# Patient Record
Sex: Female | Born: 1990 | Race: Black or African American | Hispanic: No | Marital: Single | Smoking: Former smoker
Health system: Southern US, Community
[De-identification: ages and names within clinical notes are randomized; demographics above are authoritative.]

## PROBLEM LIST (undated history)

## (undated) DIAGNOSIS — F419 Anxiety disorder, unspecified: Secondary | ICD-10-CM

## (undated) DIAGNOSIS — J45909 Unspecified asthma, uncomplicated: Secondary | ICD-10-CM

## (undated) DIAGNOSIS — B20 Human immunodeficiency virus [HIV] disease: Secondary | ICD-10-CM

## (undated) DIAGNOSIS — F32A Depression, unspecified: Secondary | ICD-10-CM

## (undated) DIAGNOSIS — Z21 Asymptomatic human immunodeficiency virus [HIV] infection status: Secondary | ICD-10-CM

## (undated) HISTORY — DX: Depression, unspecified: F32.A

## (undated) HISTORY — PX: NO PAST SURGERIES: SHX2092

## (undated) HISTORY — DX: Anxiety disorder, unspecified: F41.9

---

## 2016-04-13 DIAGNOSIS — R110 Nausea: Secondary | ICD-10-CM | POA: Insufficient documentation

## 2016-04-13 DIAGNOSIS — G44209 Tension-type headache, unspecified, not intractable: Secondary | ICD-10-CM | POA: Insufficient documentation

## 2016-04-13 DIAGNOSIS — R109 Unspecified abdominal pain: Secondary | ICD-10-CM | POA: Insufficient documentation

## 2018-05-12 DIAGNOSIS — Z21 Asymptomatic human immunodeficiency virus [HIV] infection status: Secondary | ICD-10-CM | POA: Insufficient documentation

## 2018-05-12 DIAGNOSIS — F419 Anxiety disorder, unspecified: Secondary | ICD-10-CM | POA: Insufficient documentation

## 2019-11-17 DIAGNOSIS — F32A Depression, unspecified: Secondary | ICD-10-CM | POA: Insufficient documentation

## 2020-02-07 ENCOUNTER — Emergency Department (HOSPITAL_COMMUNITY): Payer: Medicaid Other

## 2020-02-07 ENCOUNTER — Emergency Department (HOSPITAL_COMMUNITY)
Admission: EM | Admit: 2020-02-07 | Discharge: 2020-02-07 | Disposition: A | Discharge status: Home / Self Care | Payer: Medicaid Other | Attending: Emergency Medicine | Admitting: Emergency Medicine

## 2020-02-07 ENCOUNTER — Encounter (HOSPITAL_COMMUNITY): Payer: Self-pay | Admitting: *Deleted

## 2020-02-07 DIAGNOSIS — R112 Nausea with vomiting, unspecified: Secondary | ICD-10-CM | POA: Diagnosis present

## 2020-02-07 DIAGNOSIS — R1084 Generalized abdominal pain: Secondary | ICD-10-CM | POA: Diagnosis not present

## 2020-02-07 DIAGNOSIS — R197 Diarrhea, unspecified: Secondary | ICD-10-CM | POA: Insufficient documentation

## 2020-02-07 DIAGNOSIS — Z20822 Contact with and (suspected) exposure to covid-19: Secondary | ICD-10-CM | POA: Insufficient documentation

## 2020-02-07 DIAGNOSIS — R531 Weakness: Secondary | ICD-10-CM | POA: Diagnosis not present

## 2020-02-07 HISTORY — DX: Asymptomatic human immunodeficiency virus (hiv) infection status: Z21

## 2020-02-07 HISTORY — DX: Human immunodeficiency virus (HIV) disease: B20

## 2020-02-07 LAB — LIPASE, BLOOD: Lipase: 23 U/L (ref 11–51)

## 2020-02-07 LAB — CBC
HCT: 36.2 % (ref 36.0–46.0)
Hemoglobin: 12.4 g/dL (ref 12.0–15.0)
MCH: 32.2 pg (ref 26.0–34.0)
MCHC: 34.3 g/dL (ref 30.0–36.0)
MCV: 94 fL (ref 80.0–100.0)
Platelets: 254 10*3/uL (ref 150–400)
RBC: 3.85 MIL/uL — ABNORMAL LOW (ref 3.87–5.11)
RDW: 13.6 % (ref 11.5–15.5)
WBC: 10.8 10*3/uL — ABNORMAL HIGH (ref 4.0–10.5)
nRBC: 0 % (ref 0.0–0.2)

## 2020-02-07 LAB — URINALYSIS, ROUTINE W REFLEX MICROSCOPIC
Bilirubin Urine: NEGATIVE
Glucose, UA: NEGATIVE mg/dL
Hgb urine dipstick: NEGATIVE
Ketones, ur: NEGATIVE mg/dL
Leukocytes,Ua: NEGATIVE
Nitrite: NEGATIVE
Protein, ur: NEGATIVE mg/dL
Specific Gravity, Urine: 1.01 (ref 1.005–1.030)
pH: 9 — ABNORMAL HIGH (ref 5.0–8.0)

## 2020-02-07 LAB — SARS CORONAVIRUS 2 BY RT PCR (HOSPITAL ORDER, PERFORMED IN ~~LOC~~ HOSPITAL LAB): SARS Coronavirus 2: NEGATIVE

## 2020-02-07 LAB — COMPREHENSIVE METABOLIC PANEL
ALT: 35 U/L (ref 0–44)
AST: 41 U/L (ref 15–41)
Albumin: 4.6 g/dL (ref 3.5–5.0)
Alkaline Phosphatase: 41 U/L (ref 38–126)
Anion gap: 15 (ref 5–15)
BUN: 10 mg/dL (ref 6–20)
CO2: 20 mmol/L — ABNORMAL LOW (ref 22–32)
Calcium: 9.8 mg/dL (ref 8.9–10.3)
Chloride: 107 mmol/L (ref 98–111)
Creatinine, Ser: 0.72 mg/dL (ref 0.44–1.00)
GFR calc Af Amer: >60 mL/min (ref >60)
GFR calc non Af Amer: >60 mL/min (ref >60)
Glucose, Bld: 127 mg/dL — ABNORMAL HIGH (ref 70–99)
Potassium: 4 mmol/L (ref 3.5–5.1)
Sodium: 142 mmol/L (ref 135–145)
Total Bilirubin: 0.5 mg/dL (ref 0.3–1.2)
Total Protein: 7.4 g/dL (ref 6.5–8.1)

## 2020-02-07 LAB — I-STAT BETA HCG BLOOD, ED (MC, WL, AP ONLY): I-stat hCG, quantitative: <5 m[IU]/mL (ref <5)

## 2020-02-07 LAB — LACTIC ACID, PLASMA: Lactic Acid, Venous: 1.9 mmol/L (ref 0.5–1.9)

## 2020-02-07 LAB — LACTATE DEHYDROGENASE: LDH: 153 U/L (ref 98–192)

## 2020-02-07 MED ORDER — ONDANSETRON 8 MG PO TBDP: 8.0000 mg | ORAL_TABLET | Freq: Three times a day (TID) | ORAL | 0 refills | Status: DC | PRN

## 2020-02-07 MED ORDER — PROCHLORPERAZINE EDISYLATE 10 MG/2ML IJ SOLN
10.0000 mg | Freq: Once | INTRAMUSCULAR | Status: AC
  Administered 2020-02-07: 10 mg via INTRAVENOUS
  Filled 2020-02-07: qty 2

## 2020-02-07 MED ORDER — DICYCLOMINE HCL 10 MG/ML IM SOLN
20.0000 mg | Freq: Once | INTRAMUSCULAR | Status: AC
  Administered 2020-02-07: 20 mg via INTRAMUSCULAR
  Filled 2020-02-07: qty 2

## 2020-02-07 MED ORDER — SODIUM CHLORIDE 0.9 % IV BOLUS
1000.0000 mL | Freq: Once | INTRAVENOUS | Status: AC
  Administered 2020-02-07: 1000 mL via INTRAVENOUS

## 2020-02-07 MED ORDER — IOHEXOL 300 MG/ML  SOLN
100.0000 mL | Freq: Once | INTRAMUSCULAR | Status: AC | PRN
  Administered 2020-02-07: 100 mL via INTRAVENOUS

## 2020-02-07 MED ORDER — LORAZEPAM 2 MG/ML IJ SOLN
1.0000 mg | Freq: Once | INTRAMUSCULAR | Status: AC
  Administered 2020-02-07: 1 mg via INTRAVENOUS
  Filled 2020-02-07: qty 1

## 2020-02-07 NOTE — ED Provider Notes (Signed)
Patient signed to me by Dr. Jeraldine Loots pending results of labs and IV fluids.  Patient reassessed and feels better will be discharged home   Lorre Nick, MD 02/07/20 1652

## 2020-02-07 NOTE — ED Triage Notes (Signed)
BIB EMS ate at fish yesterday afternoon, about 5 am she developed vomiting and diarrhea with abd cramping, uncontrolled diarrhea in small amounts. #18 LA, 500 NS 4 mg Zofran. 109/72-60s-20-100% RA CBG 162

## 2020-02-07 NOTE — ED Provider Notes (Signed)
[facility]-EMERGENCY DEPT Provider Note   CSN: [id number] Arrival date & time: 02/07/20  1055     History Chief Complaint  Patient presents with  . Emesis  . Diarrhea    Kristine Brown is a 28 y.o. female.  HPI     Patient with HIV, but previously in her usual state of health presents with new abdominal pain, vomiting, diarrhea, weakness. She notes that she was well until this morning.  Yesterday, about 20 hours prior to ED arrival today, the patient ate fish at a fast food restaurant. This morning she woke about 5 hours prior to ED arrival with diffuse abdominal pain, nausea, vomiting, and began to have innumerable episodes of loose stool.  She has noted inability to control her bowels secondary to pain, diarrhea, with no ability to take anything for relief, nor to take her typical meds. She does not know left fever, notes chills sensation, however.  The fish may have been flounder  Past Medical History:  Diagnosis Date  . HIV (human immunodeficiency virus infection) (HCC)     There are no problems to display for this patient.   History reviewed. No pertinent surgical history.   OB History   No obstetric history on file.     No family history on file.  Social History   Tobacco Use  . Smoking status: Not on file  Substance Use Topics  . Alcohol use: Not on file  . Drug use: Not on file    Home Medications Prior to Admission medications   Not on File    Allergies    Patient has no allergy information on record.  Review of Systems   Review of Systems  Constitutional:       Per HPI, otherwise negative  HENT:       Per HPI, otherwise negative  Respiratory:       Per HPI, otherwise negative  Cardiovascular:       Per HPI, otherwise negative  Gastrointestinal: Positive for abdominal pain, diarrhea, nausea and vomiting.  Endocrine:       Negative aside from HPI  Genitourinary:       Neg aside from HPI   Musculoskeletal:        Per HPI, otherwise negative  Skin: Negative.   Allergic/Immunologic: Positive for immunocompromised state.  Neurological: Positive for weakness. Negative for syncope.    Physical Exam Updated Vital Signs BP 124/79   Pulse (!) 53   Temp 98.3 F (36.8 C) (Oral)   Resp 18   SpO2 100%   Physical Exam Vitals and nursing note reviewed.  Constitutional:      Appearance: She is well-developed. She is ill-appearing and diaphoretic.  HENT:     Head: Normocephalic and atraumatic.  Eyes:     Conjunctiva/sclera: Conjunctivae normal.  Cardiovascular:     Rate and Rhythm: Normal rate and regular rhythm.  Pulmonary:     Effort: Pulmonary effort is normal. No respiratory distress.     Breath sounds: Normal breath sounds. No stridor.  Abdominal:     Comments: She complains of pain throughout.  No distension  Skin:    General: Skin is warm.  Neurological:     Mental Status: She is alert and oriented to person, place, and time.     Cranial Nerves: No cranial nerve deficit.     ED Results / Procedures / Treatments   Labs (all labs ordered are listed, but only abnormal results are displayed) Labs Reviewed  COMPREHENSIVE METABOLIC PANEL - Abnormal; Notable for the following components:      Result Value   CO2 20 (*)    Glucose, Bld 127 (*)    All other components within normal limits  CBC - Abnormal; Notable for the following components:   WBC 10.8 (*)    RBC 3.85 (*)    All other components within normal limits  SARS CORONAVIRUS 2 BY RT PCR (HOSPITAL ORDER, PERFORMED IN [facility])  LIPASE, BLOOD  LACTATE DEHYDROGENASE  LACTIC ACID, PLASMA  URINALYSIS, ROUTINE W REFLEX MICROSCOPIC  I-STAT BETA HCG BLOOD, ED ([facility], [facility], [facility] ONLY)   Radiology CT Abdomen Pelvis W Contrast  Result Date: 02/07/2020 CLINICAL DATA:  Abdominal pain, fever and distension, developed nausea and vomiting at 0500 hours with associated cramping and diarrhea beginning at 0500 hours, had fish  yesterday; history HIV EXAM: CT ABDOMEN AND PELVIS WITH CONTRAST TECHNIQUE: Multidetector CT imaging of the abdomen and pelvis was performed using the standard protocol following bolus administration of intravenous contrast. Sagittal and coronal MPR images reconstructed from axial data set. CONTRAST:  OMNIPAQUE IOHEXOL 300 MG/ML SOLN IV. No oral contrast. COMPARISON:  None FINDINGS: Lower chest: Lung bases clear Hepatobiliary: Gallbladder contracted.  Liver normal appearance. Pancreas: Normal appearance Spleen: Normal appearance Adrenals/Urinary Tract: Adrenal glands, kidneys, ureters, and bladder normal appearance Stomach/Bowel: Appendix not visualized but no pericecal inflammatory process seen. Stomach and bowel loops otherwise normal appearance. Vascular/Lymphatic: Vascular structures patent.  No adenopathy. Reproductive: Unremarkable uterus and LEFT ovary. Ring enhancement of a ruptured physiologic follicle in the RIGHT ovary. Other: Tiny amount of nonspecific free fluid, potentially physiologic. No free air. No hernia. Musculoskeletal: Osseous structures unremarkable. IMPRESSION: No intra-abdominal or intrapelvic abnormalities. Electronically Signed   By: Ulyses Southward M.D.   On: 02/07/2020 13:15    Procedures Procedures (including critical care time)  Medications Ordered in ED Medications  dicyclomine (BENTYL) injection 20 mg (20 mg Intramuscular Given 02/07/20 1225)  LORazepam (ATIVAN) injection 1 mg (1 mg Intravenous Given 02/07/20 1223)  iohexol (OMNIPAQUE) 300 MG/ML solution 100 mL (100 mLs Intravenous Contrast Given 02/07/20 1256)    ED Course  I have reviewed the triage vital signs and the nursing notes.  Pertinent labs & imaging results that were available during my care of the patient were reviewed by me and considered in my medical decision making (see chart for details).    MDM Rules/Calculators/A&P Adult female with HIV, reportedly well controlled presents with 1 day of nausea,  vomiting, diarrhea, but is awake, alert, afebrile.  Patient received fluid resuscitation, antiemetics, analgesics, had some improvement, but on signout was receiving additional fluid resuscitation, awaiting additional lab studies, and p.o. challenge. Otherwise, abdominal exam, CT scan reassuring, no evidence for colitis, diverticulitis.  Dr. Freida Busman is aware of the patient. Final Clinical Impression(s) / ED Diagnoses Final diagnoses:  Nausea vomiting and diarrhea  Generalized abdominal pain     Gerhard Munch, MD 02/07/20 [phone]

## 2020-02-07 NOTE — ED Notes (Signed)
Dr Allen at bedside  

## 2020-02-08 ENCOUNTER — Other Ambulatory Visit: Payer: Self-pay

## 2020-02-08 ENCOUNTER — Encounter (HOSPITAL_COMMUNITY): Payer: Self-pay

## 2020-02-08 ENCOUNTER — Emergency Department (HOSPITAL_COMMUNITY)
Admission: EM | Admit: 2020-02-08 | Discharge: 2020-02-08 | Disposition: A | Discharge status: Home / Self Care | Payer: Medicaid Other | Attending: Emergency Medicine | Admitting: Emergency Medicine

## 2020-02-08 DIAGNOSIS — R197 Diarrhea, unspecified: Secondary | ICD-10-CM | POA: Insufficient documentation

## 2020-02-08 DIAGNOSIS — Z5321 Procedure and treatment not carried out due to patient leaving prior to being seen by health care provider: Secondary | ICD-10-CM | POA: Insufficient documentation

## 2020-02-08 DIAGNOSIS — R1110 Vomiting, unspecified: Secondary | ICD-10-CM | POA: Insufficient documentation

## 2020-02-08 NOTE — ED Notes (Signed)
Pt stated she was leaving. Waited outside for her mom to pick her up and when the mother got here, the mother told us that she needed to stay.  20 minutes later they left again.

## 2020-02-08 NOTE — ED Notes (Signed)
Patient requested for me to take IV out because she was leaving because she felt better. Patient ask for yellow socks and to put her away from people until her mother came to pick her up.

## 2020-02-08 NOTE — ED Triage Notes (Addendum)
Per EMS- patient c/o vomiting and incontinent of diarrhea. Patient was seen yesterday for the same. Patient was given 150 ml NS prior to arrival to the ED.  Patient yelling in triage. Patient stated, "I am not going out into that lobby with Covid patients. I want to go to another hospital. Can I call 911 and get taken to another hospital."  Charge nurse was present. EMS reported that the patient took Zofran prior to them leaving.

## 2020-02-09 ENCOUNTER — Emergency Department (HOSPITAL_COMMUNITY)
Admission: EM | Admit: 2020-02-09 | Discharge: 2020-02-09 | Disposition: A | Discharge status: Home / Self Care | Payer: Medicaid Other | Attending: Emergency Medicine | Admitting: Emergency Medicine

## 2020-02-09 ENCOUNTER — Encounter (HOSPITAL_COMMUNITY): Payer: Self-pay | Admitting: *Deleted

## 2020-02-09 ENCOUNTER — Telehealth: Payer: Self-pay

## 2020-02-09 DIAGNOSIS — R1110 Vomiting, unspecified: Secondary | ICD-10-CM | POA: Diagnosis not present

## 2020-02-09 DIAGNOSIS — R109 Unspecified abdominal pain: Secondary | ICD-10-CM | POA: Insufficient documentation

## 2020-02-09 DIAGNOSIS — Z5321 Procedure and treatment not carried out due to patient leaving prior to being seen by health care provider: Secondary | ICD-10-CM | POA: Insufficient documentation

## 2020-02-09 LAB — CBC
HCT: 39 % (ref 36.0–46.0)
Hemoglobin: 12.7 g/dL (ref 12.0–15.0)
MCH: 30.8 pg (ref 26.0–34.0)
MCHC: 32.6 g/dL (ref 30.0–36.0)
MCV: 94.4 fL (ref 80.0–100.0)
Platelets: 290 10*3/uL (ref 150–400)
RBC: 4.13 MIL/uL (ref 3.87–5.11)
RDW: 13.3 % (ref 11.5–15.5)
WBC: 5.9 10*3/uL (ref 4.0–10.5)
nRBC: 0 % (ref 0.0–0.2)

## 2020-02-09 LAB — COMPREHENSIVE METABOLIC PANEL
ALT: 34 U/L (ref 0–44)
AST: 28 U/L (ref 15–41)
Albumin: 4.2 g/dL (ref 3.5–5.0)
Alkaline Phosphatase: 43 U/L (ref 38–126)
Anion gap: 12 (ref 5–15)
BUN: 8 mg/dL (ref 6–20)
CO2: 21 mmol/L — ABNORMAL LOW (ref 22–32)
Calcium: 9.4 mg/dL (ref 8.9–10.3)
Chloride: 105 mmol/L (ref 98–111)
Creatinine, Ser: 0.96 mg/dL (ref 0.44–1.00)
GFR calc Af Amer: >60 mL/min (ref >60)
GFR calc non Af Amer: >60 mL/min (ref >60)
Glucose, Bld: 101 mg/dL — ABNORMAL HIGH (ref 70–99)
Potassium: 3.3 mmol/L — ABNORMAL LOW (ref 3.5–5.1)
Sodium: 138 mmol/L (ref 135–145)
Total Bilirubin: 0.8 mg/dL (ref 0.3–1.2)
Total Protein: 7.4 g/dL (ref 6.5–8.1)

## 2020-02-09 LAB — URINALYSIS, ROUTINE W REFLEX MICROSCOPIC
Bilirubin Urine: NEGATIVE
Glucose, UA: NEGATIVE mg/dL
Hgb urine dipstick: NEGATIVE
Ketones, ur: 80 mg/dL — AB
Nitrite: NEGATIVE
Protein, ur: 30 mg/dL — AB
Specific Gravity, Urine: 1.03 (ref 1.005–1.030)
pH: 6 (ref 5.0–8.0)

## 2020-02-09 LAB — LIPASE, BLOOD: Lipase: 27 U/L (ref 11–51)

## 2020-02-09 LAB — I-STAT BETA HCG BLOOD, ED (MC, WL, AP ONLY): I-stat hCG, quantitative: <5 m[IU]/mL (ref <5)

## 2020-02-09 NOTE — ED Notes (Signed)
Called patient several times didn't answer

## 2020-02-09 NOTE — ED Triage Notes (Signed)
To ED for eval of abd pain. States she was at [facility] for same 2 days ago and discharged with zofran. States she is taking the zofran without relief. Pt states she is unable to normally prescribed meds due to vomiting.

## 2020-02-09 NOTE — Telephone Encounter (Signed)
Patient called in to get her tested result from 02/07/2020 which ws negative. Patient reported that she was having chest pain that was getting worse. I asked patient if I could send her for triage and she refused to talk with anyone. I ask patient if she wanted me to call 911 and she refused at first and then mom asked me to call. I think patient may have hung up while I was calling. Tried to contact patient back but no answer.

## 2021-02-04 ENCOUNTER — Other Ambulatory Visit (HOSPITAL_COMMUNITY): Payer: Self-pay

## 2021-02-04 ENCOUNTER — Other Ambulatory Visit: Payer: Self-pay

## 2021-02-04 ENCOUNTER — Ambulatory Visit: Payer: Self-pay

## 2021-02-04 ENCOUNTER — Other Ambulatory Visit: Payer: Medicaid Other

## 2021-02-04 DIAGNOSIS — Z113 Encounter for screening for infections with a predominantly sexual mode of transmission: Secondary | ICD-10-CM

## 2021-02-04 DIAGNOSIS — B20 Human immunodeficiency virus [HIV] disease: Secondary | ICD-10-CM

## 2021-02-04 DIAGNOSIS — Z79899 Other long term (current) drug therapy: Secondary | ICD-10-CM

## 2021-02-05 LAB — URINALYSIS
Bilirubin Urine: NEGATIVE
Glucose, UA: NEGATIVE
Hgb urine dipstick: NEGATIVE
Leukocytes,Ua: NEGATIVE
Nitrite: NEGATIVE
Protein, ur: NEGATIVE
Specific Gravity, Urine: 1.029 (ref 1.001–1.035)
pH: 5.5 (ref 5.0–8.0)

## 2021-02-05 LAB — URINE CYTOLOGY ANCILLARY ONLY
Chlamydia: NEGATIVE
Comment: NEGATIVE
Comment: NORMAL
Neisseria Gonorrhea: NEGATIVE

## 2021-02-05 LAB — T-HELPER CELL (CD4) - (RCID CLINIC ONLY)
CD4 % Helper T Cell: 43 % (ref 33–65)
CD4 T Cell Abs: 1010 /uL (ref 400–1790)

## 2021-02-08 LAB — HIV-1/2 AB - DIFFERENTIATION
HIV-1 antibody: POSITIVE — AB
HIV-2 Ab: NEGATIVE

## 2021-02-08 LAB — HIV ANTIBODY (ROUTINE TESTING W REFLEX): HIV 1&2 Ab, 4th Generation: REACTIVE — AB

## 2021-02-08 LAB — COMPLETE METABOLIC PANEL WITH GFR
AG Ratio: 1.7 (calc) (ref 1.0–2.5)
ALT: 15 U/L (ref 6–29)
AST: 18 U/L (ref 10–30)
Albumin: 4.6 g/dL (ref 3.6–5.1)
Alkaline phosphatase (APISO): 42 U/L (ref 31–125)
BUN: 9 mg/dL (ref 7–25)
CO2: 27 mmol/L (ref 20–32)
Calcium: 9.7 mg/dL (ref 8.6–10.2)
Chloride: 104 mmol/L (ref 98–110)
Creat: 0.9 mg/dL (ref 0.50–0.97)
Globulin: 2.7 g/dL (calc) (ref 1.9–3.7)
Glucose, Bld: 96 mg/dL (ref 65–99)
Potassium: 3.9 mmol/L (ref 3.5–5.3)
Sodium: 138 mmol/L (ref 135–146)
Total Bilirubin: 0.5 mg/dL (ref 0.2–1.2)
Total Protein: 7.3 g/dL (ref 6.1–8.1)
eGFR: 87 mL/min/{1.73_m2} (ref >=60)

## 2021-02-08 LAB — CBC WITH DIFFERENTIAL/PLATELET
Absolute Monocytes: 581 {cells}/uL (ref 200–950)
Basophils Absolute: 29 {cells}/uL (ref 0–200)
Basophils Relative: 0.6 %
Eosinophils Absolute: 48 {cells}/uL (ref 15–500)
Eosinophils Relative: 1 %
HCT: 37.3 % (ref 35.0–45.0)
Hemoglobin: 12 g/dL (ref 11.7–15.5)
Lymphs Abs: 2371 {cells}/uL (ref 850–3900)
MCH: 30.8 pg (ref 27.0–33.0)
MCHC: 32.2 g/dL (ref 32.0–36.0)
MCV: 95.9 fL (ref 80.0–100.0)
MPV: 10.2 fL (ref 7.5–12.5)
Monocytes Relative: 12.1 %
Neutro Abs: 1771 {cells}/uL (ref 1500–7800)
Neutrophils Relative %: 36.9 %
Platelets: 164 10*3/uL (ref 140–400)
RBC: 3.89 Million/uL (ref 3.80–5.10)
RDW: 12.6 % (ref 11.0–15.0)
Total Lymphocyte: 49.4 %
WBC: 4.8 10*3/uL (ref 3.8–10.8)

## 2021-02-08 LAB — HEPATITIS B CORE ANTIBODY, TOTAL: Hep B Core Total Ab: NONREACTIVE

## 2021-02-08 LAB — HEPATITIS C ANTIBODY
Hepatitis C Ab: NONREACTIVE
SIGNAL TO CUT-OFF: 0.02

## 2021-02-08 LAB — LIPID PANEL
Cholesterol: 168 mg/dL
HDL: 59 mg/dL
LDL Cholesterol (Calc): 95 mg/dL
Non-HDL Cholesterol (Calc): 109 mg/dL
Total CHOL/HDL Ratio: 2.8 (calc)
Triglycerides: 53 mg/dL

## 2021-02-08 LAB — QUANTIFERON-TB GOLD PLUS
Mitogen-NIL: >10 IU/mL
NIL: 0.07 IU/mL
QuantiFERON-TB Gold Plus: NEGATIVE
TB1-NIL: <0 IU/mL
TB2-NIL: 0 IU/mL

## 2021-02-08 LAB — HIV-1 RNA ULTRAQUANT REFLEX TO GENTYP+
HIV 1 RNA Quant: NOT DETECTED copies/mL
HIV-1 RNA Quant, Log: NOT DETECTED Log copies/mL

## 2021-02-08 LAB — RPR: RPR Ser Ql: NONREACTIVE

## 2021-02-08 LAB — HEPATITIS B SURFACE ANTIBODY,QUALITATIVE: Hep B S Ab: REACTIVE — AB

## 2021-02-08 LAB — HEPATITIS A ANTIBODY, TOTAL: Hepatitis A AB,Total: NONREACTIVE

## 2021-02-08 LAB — HLA B*5701: HLA-B*5701 w/rflx HLA-B High: NEGATIVE

## 2021-02-08 LAB — HEPATITIS B SURFACE ANTIGEN: Hepatitis B Surface Ag: NONREACTIVE

## 2021-02-11 ENCOUNTER — Other Ambulatory Visit: Payer: Self-pay | Admitting: Pharmacist

## 2021-02-11 DIAGNOSIS — B20 Human immunodeficiency virus [HIV] disease: Secondary | ICD-10-CM

## 2021-02-11 MED ORDER — BICTEGRAVIR-EMTRICITAB-TENOFOV 50-200-25 MG PO TABS: 1.0000 | ORAL_TABLET | Freq: Every day | ORAL | 0 refills | Status: AC

## 2021-02-11 NOTE — Progress Notes (Signed)
Medication Samples have been provided to the patient.  Drug name: Biktarvy        Strength: 50/200/25 mg       Qty: 28 tablets (4 bottles) LOT: CHSYVB   Exp.Date: 6/24  Dosing instructions: Take one tablet by mouth once daily  The patient has been instructed regarding the correct time, dose, and frequency of taking this medication, including desired effects and most common side effects.   Winthrop Shannahan, PharmD, CPP Clinical Pharmacist Practitioner Infectious Diseases Clinical Pharmacist [facility]  05/13/2020, 10:07 AM  

## 2021-02-14 ENCOUNTER — Encounter: Payer: Self-pay | Admitting: Infectious Diseases

## 2021-02-18 ENCOUNTER — Ambulatory Visit: Payer: Medicaid Other | Admitting: Pharmacist

## 2021-02-18 ENCOUNTER — Other Ambulatory Visit (HOSPITAL_COMMUNITY): Payer: Self-pay

## 2021-02-18 ENCOUNTER — Encounter: Payer: Medicaid Other | Admitting: Infectious Diseases

## 2021-02-18 ENCOUNTER — Telehealth: Payer: Self-pay

## 2021-02-18 NOTE — Telephone Encounter (Signed)
[facility] Patient Product/process development scientist completed.    The patient is insured through [employer]  and has a 4.00 copay.   Kristine Brown is covered under pharmacy benefits, prescription can be filled at [facility].   We will continue to follow to see if copay assistance is needed.  Clearance Coots, CPhT Specialty Pharmacy Patient [facility] Phone: [phone] Fax:  [phone]

## 2021-02-18 NOTE — Progress Notes (Deleted)
   Name: Kristine Brown  DOB: May 03, 1991 MRN: [id number] PCP: Patient, No Pcp Per (Inactive)     Brief Narrative:  *** is a 30 y.o. *** with HIV disease, Dx ***.  Previously in care with [facility].  CD4 nadir *** VL *** HIV Risk: *** History of OIs: *** Intake Labs ***: Hep B sAg (***), sAb (***), cAb (***); Hep A (***), Hep C (***) Quantiferon (***) HLA B*5701 (***) G6PD: (normal 06-2018) CMV IgG (+) Toxo IgG (- 06-2018)   Previous Regimens: ***  Genotypes: ***  Subjective:  No chief complaint on file.    HPI: ***  Chart review shows undetectable viral loads since 2018.  Quantiferon GOLD (-) 07-2020.  Last PAP smear: unknown    No flowsheet data found.  Health Maintenance = Receives annual preventative care through {his/her} PCP and is up to date on all recommended screenings and vaccinations. {He/She} denies cigarette use, excessive alcohol use and other substance abuse. {He/She} is seeing a dentist regularly and no dental complaints today.   ROS  Past Medical History:  Diagnosis Date   HIV (human immunodeficiency virus infection) (Advance)     Outpatient Medications Prior to Visit  Medication Sig Dispense Refill   bictegravir-emtricitabine-tenofovir AF (BIKTARVY) 50-200-25 MG TABS tablet Take 1 tablet by mouth daily for 28 days. 28 tablet 0   ondansetron (ZOFRAN ODT) 8 MG disintegrating tablet Take 1 tablet (8 mg total) by mouth every 8 (eight) hours as needed for nausea or vomiting. 20 tablet 0   No facility-administered medications prior to visit.     No Known Allergies  Social History   Tobacco Use   Smoking status: Never   Smokeless tobacco: Never  Vaping Use   Vaping Use: Never used  Substance Use Topics   Alcohol use: Never   Drug use: Never    No family history on file.  Social History   Substance and Sexual Activity  Sexual Activity Not on file     Objective:  There were no vitals filed for this visit. There is no height or  weight on file to calculate BMI.  Physical Exam  Lab Results Lab Results  Component Value Date   WBC 4.8 02/04/2021   HGB 12.0 02/04/2021   HCT 37.3 02/04/2021   MCV 95.9 02/04/2021   PLT 164 02/04/2021    Lab Results  Component Value Date   CREATININE 0.90 02/04/2021   BUN 9 02/04/2021   NA 138 02/04/2021   K 3.9 02/04/2021   CL 104 02/04/2021   CO2 27 02/04/2021    Lab Results  Component Value Date   ALT 15 02/04/2021   AST 18 02/04/2021   ALKPHOS 43 02/09/2020   BILITOT 0.5 02/04/2021    Lab Results  Component Value Date   CHOL 168 02/04/2021   HDL 59 02/04/2021   LDLCALC 95 02/04/2021   TRIG 53 02/04/2021   CHOLHDL 2.8 02/04/2021   HIV 1 RNA Quant (copies/mL)  Date Value  02/04/2021 NOT DETECTED   CD4 T Cell Abs (/uL)  Date Value  02/04/2021 1,010     Assessment & Plan:   Problem List Items Addressed This Visit   None   Janene Madeira, MSN, NP-C [place] [facility] [place] Pager: [phone] Office: [phone]  02/18/21  8:46 AM

## 2021-03-04 ENCOUNTER — Telehealth: Payer: Self-pay

## 2021-03-04 NOTE — Telephone Encounter (Signed)
Patient called requesting additional medication. She was provided with 28 days worth of Biktarvy on 9/13. She says she must have thrown one bottle away as she ran out of medication today.   Patient has not been seen by a provider as she has no showed and canceled previous appointments. She reportedly told one of the CMA's that she needed more medication as she was transferring care to [place] and had not found a provider. She then said that she would like to make an appointment at our clinic. She is schedueld as a new patient appointment with Marcos Eke, NP 10/7  Advised her that RN cannot refill her medication as she has not been seen here by a provider, but that concerns would be routed to pharmacy.   Sandie Ano, RN

## 2021-03-04 NOTE — Telephone Encounter (Signed)
RN spoke with pharmacy team, they agree that patient will need appointment for further medication.   Patient was adamant she did not want to be 3 days without medication. RN rescheduled her to see Marcos Eke, NP tomorrow 10/5. She states she did not know about her appointment that she missed on 9/20. Patient has completed labs and financial. Says she is transferring from [place].   Sandie Ano, RN

## 2021-03-05 ENCOUNTER — Other Ambulatory Visit (HOSPITAL_COMMUNITY): Payer: Self-pay

## 2021-03-05 ENCOUNTER — Other Ambulatory Visit: Payer: Self-pay

## 2021-03-05 ENCOUNTER — Ambulatory Visit (INDEPENDENT_AMBULATORY_CARE_PROVIDER_SITE_OTHER): Payer: Medicaid Other | Admitting: Family

## 2021-03-05 ENCOUNTER — Encounter: Payer: Self-pay | Admitting: Family

## 2021-03-05 ENCOUNTER — Ambulatory Visit: Payer: Medicaid Other | Admitting: Pharmacist

## 2021-03-05 VITALS — BP 105/70 | HR 60 | Temp 97.9°F | Wt 135.0 lb

## 2021-03-05 DIAGNOSIS — Z21 Asymptomatic human immunodeficiency virus [HIV] infection status: Secondary | ICD-10-CM

## 2021-03-05 DIAGNOSIS — F329 Major depressive disorder, single episode, unspecified: Secondary | ICD-10-CM

## 2021-03-05 MED ORDER — BIKTARVY 50-200-25 MG PO TABS
1.0000 | ORAL_TABLET | Freq: Every day | ORAL | 4 refills | Status: DC
Start: 2021-03-05 — End: 2021-07-22
  Filled 2021-03-05 – 2021-03-06 (×2): qty 30, 30d supply, fill #0
  Filled 2021-04-01: qty 30, 30d supply, fill #1
  Filled 2021-04-28: qty 30, 30d supply, fill #2
  Filled 2021-06-13: qty 30, 30d supply, fill #3
  Filled 2021-07-02: qty 30, 30d supply, fill #4

## 2021-03-05 MED ORDER — ESCITALOPRAM OXALATE 10 MG PO TABS
5.0000 mg | ORAL_TABLET | Freq: Every day | ORAL | 4 refills | Status: DC
  Filled 2021-03-05: qty 30, 60d supply, fill #0
  Filled 2021-04-28: qty 30, 60d supply, fill #1

## 2021-03-05 NOTE — Progress Notes (Signed)
Brief Narrative   Patient ID: Kristine Brown, female    DOB: Oct 31, 1990, 30 y.o.   MRN: [id number]  Kristine Brown is a 30 y/o AA female diagnosed with HIV in May of 2019 with risk factor of heterosexual contact. Initial CD4 count of 769 and viral load of 16,400. Genotype with no significant resistance patterns. Entered care at  [facility] Stage 1. [id number] negative. Sole medication regimen of Biktarvy.   Subjective:    Chief Complaint  Patient presents with   New Patient (Initial Visit)    Transferring B20 - pt reports feeling very fatigue and upper back pain.     HPI:  Kristine Brown is a 30 y.o. female with HIV disease and anxiety/depression presenting today to transfer care of HIV disease from previous provider in [place].   Kristine Brown previous blood work from 12/13/20 with viral load that was undetectable and CD4 count of 769. Diagnosed with HIV disease in May of 2019 with risk factor of heterosexual contact. Initial viral load of 16,400 with CD4 count of 352. Genotype with Subtype B and no significant resistance patterns. She was started on Biktarvy. No history of opportunistic infections. She moved to [place] about 2 years ago and has been going back and forth between [place] and [place]. Has been taking her Biktarvy daily as prescribed with no adverse side effects. Currently out of medication. Has Medicaid and is receiving SNAP for her and her children. Housing is stable and living in [place]. Does not have a lot of support in the area. Has been having problems with increased fatigue, decreased drive/motivation and generalized aches and pains.   Kristine Brown was previously diagnosed with depression and has been having increased depression symptoms related to her HIV diagnosis. She provides a form to complete about HIV and receiving disability as she wants to work and has been unable to secure employment. Has been out of her previously prescribed Lexapro for about  a month now. Has had thoughts of "ending it" but not currently.  No current recreational or illicit drug use, tobacco use or alcohol consumption.   Kristine Brown initial clinic lab work with confirmation of HIV-1 disease. CD4 count is 1010 with viral load undetectable. [id number] and Quantiferon Gold negative. Immune to Hepatitis B. No infection with Hepatitis C or STIs. Kidney function, liver function and electrolytes within normal ranges.   No Known Allergies    Outpatient Medications Prior to Visit  Medication Sig Dispense Refill   albuterol (VENTOLIN HFA) 108 (90 Base) MCG/ACT inhaler Inhale into the lungs.     Multiple Vitamins-Minerals (ONCOVITE) TABS Take 1 tablet by mouth daily.     ondansetron (ZOFRAN ODT) 8 MG disintegrating tablet Take 1 tablet (8 mg total) by mouth every 8 (eight) hours as needed for nausea or vomiting. 20 tablet 0   bictegravir-emtricitabine-tenofovir AF (BIKTARVY) 50-200-25 MG TABS tablet Take 1 tablet by mouth daily.     escitalopram (LEXAPRO) 5 MG tablet Take 1 tablet by mouth daily.     ondansetron (ZOFRAN-ODT) 4 MG disintegrating tablet Take by mouth. (Patient not taking: Reported on 03/05/2021)     No facility-administered medications prior to visit.     Past Medical History:  Diagnosis Date   Anxiety    Depression    HIV (human immunodeficiency virus infection) (HCC)      No past surgical history on file.    Review of Systems  Constitutional:  Negative for appetite change, chills, diaphoresis, fatigue, fever and unexpected  weight change.  Eyes:        Negative for acute change in vision  Respiratory:  Negative for chest tightness, shortness of breath and wheezing.   Cardiovascular:  Negative for chest pain.  Gastrointestinal:  Negative for diarrhea, nausea and vomiting.  Genitourinary:  Negative for dysuria, pelvic pain and vaginal discharge.  Musculoskeletal:  Negative for neck pain and neck stiffness.  Skin:  Negative for rash.   Neurological:  Negative for seizures, syncope, weakness and headaches.  Hematological:  Negative for adenopathy. Does not bruise/bleed easily.  Psychiatric/Behavioral:  Positive for decreased concentration and dysphoric mood. Negative for hallucinations.      Objective:    BP 105/70   Pulse 60   Temp 97.9 F (36.6 C) (Oral)   Wt 135 lb (61.2 kg)   SpO2 100%   BMI 24.69 kg/m  Nursing note and vital signs reviewed.  Physical Exam Constitutional:      General: She is not in acute distress.    Appearance: She is well-developed.     Comments: Seated in the chair; labile mood.   Eyes:     Conjunctiva/sclera: Conjunctivae normal.  Cardiovascular:     Rate and Rhythm: Normal rate and regular rhythm.     Heart sounds: Normal heart sounds. No murmur heard.   No friction rub. No gallop.  Pulmonary:     Effort: Pulmonary effort is normal. No respiratory distress.     Breath sounds: Normal breath sounds. No wheezing or rales.  Chest:     Chest wall: No tenderness.  Abdominal:     General: Bowel sounds are normal.     Palpations: Abdomen is soft.     Tenderness: There is no abdominal tenderness.  Musculoskeletal:     Cervical back: Neck supple.  Lymphadenopathy:     Cervical: No cervical adenopathy.  Skin:    General: Skin is warm and dry.     Findings: No rash.  Neurological:     Mental Status: She is alert and oriented to person, place, and time.     Depression screen PHQ 2/9 03/05/2021  Decreased Interest 2  Down, Depressed, Hopeless 2  PHQ - 2 Score 4       Assessment & Plan:    Patient Active Problem List   Diagnosis Date Noted   Depression 11/17/2019   Anxiety 05/12/2018   Asymptomatic HIV infection (HCC) 05/12/2018   Abdominal pain in female 04/13/2016   Acute non intractable tension-type headache 04/13/2016   Nausea 04/13/2016     Problem List Items Addressed This Visit       Other   Asymptomatic HIV infection (HCC)    Kristine Brown is a 30 y/o AA  female with HIV-1 disease diagnosed in May 2019 with risk factor of heterosexual contact who remains well controlled on Biktarvy with no signs/symptoms of opportunistic infection. Reviewed clinic entry lab work and provided brief orientation. Completed form at her request confirming her HIV diagnosis. She is applying for disability and may benefit from [employer]. Continue current dose of Biktarvy. Plan for follow up in 1 month or sooner if needed.       Relevant Medications   bictegravir-emtricitabine-tenofovir AF (BIKTARVY) 50-200-25 MG TABS tablet   Depression    Ms. Barasch appears to be having an exacerbation of her depression and has been off her Lexapro for about 1 month now. No current suicidal ideation or signs of psychosis. Appears she is continuing to have difficulty with her  HIV diagnosis. Discussed that counseling and supportive care in addition to her Lexapro may help with her symptoms. Will increase Lexapro to 10 mg. Advised to seek further help if thoughts of suicide develop or symptoms worsen.       Relevant Medications   escitalopram (LEXAPRO) 10 MG tablet   Other Visit Diagnoses     HIV disease (HCC)    -  Primary   Relevant Medications   bictegravir-emtricitabine-tenofovir AF (BIKTARVY) 50-200-25 MG TABS tablet        I have changed Emylee Chittenden's escitalopram. I am also having her maintain her ondansetron, albuterol, Oncovite, and Biktarvy.   Meds ordered this encounter  Medications   bictegravir-emtricitabine-tenofovir AF (BIKTARVY) 50-200-25 MG TABS tablet    Sig: Take 1 tablet by mouth daily.    Dispense:  30 tablet    Refill:  4    Please mail to patient.    Order Specific Question:   Supervising Provider    Answer:   Judyann Munson [[id number]]   escitalopram (LEXAPRO) 10 MG tablet    Sig: Take 1/2 tablet by mouth daily.    Dispense:  30 tablet    Refill:  4    Please mail    Order Specific Question:   Supervising Provider    Answer:    Judyann Munson [[id number]]               Follow-up: Return in about 1 month (around 04/05/2021), or if symptoms worsen or fail to improve.   Marcos Eke, MSN, FNP-C Nurse Practitioner [facility] for [facility] Main number: [phone]

## 2021-03-05 NOTE — Assessment & Plan Note (Signed)
Kristine Brown is a 30 y/o AA female with HIV-1 disease diagnosed in May 2019 with risk factor of heterosexual contact who remains well controlled on Biktarvy with no signs/symptoms of opportunistic infection. Reviewed clinic entry lab work and provided brief orientation. Completed form at her request confirming her HIV diagnosis. She is applying for disability and may benefit from [employer]. Continue current dose of Biktarvy. Plan for follow up in 1 month or sooner if needed.

## 2021-03-05 NOTE — Patient Instructions (Addendum)
Nice to see meet you.  We will continue your medication daily.  Refills have been sent to the pharmacy  Start taking Lexapro again.   We will plan to follow up in 1 month or sooner if needed.

## 2021-03-05 NOTE — Assessment & Plan Note (Signed)
Kristine Brown appears to be having an exacerbation of her depression and has been off her Lexapro for about 1 month now. No current suicidal ideation or signs of psychosis. Appears she is continuing to have difficulty with her HIV diagnosis. Discussed that counseling and supportive care in addition to her Lexapro may help with her symptoms. Will increase Lexapro to 10 mg. Advised to seek further help if thoughts of suicide develop or symptoms worsen.

## 2021-03-06 ENCOUNTER — Other Ambulatory Visit (HOSPITAL_COMMUNITY): Payer: Self-pay

## 2021-03-07 ENCOUNTER — Ambulatory Visit: Payer: Medicaid Other | Admitting: Family

## 2021-03-07 ENCOUNTER — Ambulatory Visit: Payer: Medicaid Other | Admitting: Pharmacist

## 2021-03-11 ENCOUNTER — Other Ambulatory Visit: Payer: Self-pay | Admitting: Pharmacist

## 2021-03-11 DIAGNOSIS — Z21 Asymptomatic human immunodeficiency virus [HIV] infection status: Secondary | ICD-10-CM

## 2021-03-11 MED ORDER — BICTEGRAVIR-EMTRICITAB-TENOFOV 50-200-25 MG PO TABS: 1.0000 | ORAL_TABLET | Freq: Every day | ORAL | 0 refills | Status: AC

## 2021-03-11 NOTE — Progress Notes (Signed)
Medication Samples have been provided to the patient.  Drug name: Biktarvy        Strength: 50/200/25 mg       Qty: 7 tablets (1 bottles) LOT: CHSYVB    Exp.Date: 06/24  Dosing instructions: Take one tablet by mouth once daily  The patient has been instructed regarding the correct time, dose, and frequency of taking this medication, including desired effects and most common side effects.   Margarite Gouge, PharmD, CPP Clinical Pharmacist Practitioner Infectious Diseases Clinical Pharmacist [facility]

## 2021-03-17 ENCOUNTER — Ambulatory Visit: Payer: Medicaid Other | Admitting: Infectious Diseases

## 2021-03-17 ENCOUNTER — Ambulatory Visit: Payer: Medicaid Other | Admitting: Pharmacist

## 2021-03-28 ENCOUNTER — Other Ambulatory Visit (HOSPITAL_COMMUNITY): Payer: Self-pay

## 2021-04-01 ENCOUNTER — Other Ambulatory Visit (HOSPITAL_COMMUNITY): Payer: Self-pay

## 2021-04-03 ENCOUNTER — Other Ambulatory Visit (HOSPITAL_COMMUNITY): Payer: Self-pay

## 2021-04-07 ENCOUNTER — Ambulatory Visit: Payer: Medicaid Other | Admitting: Family

## 2021-04-07 ENCOUNTER — Telehealth: Payer: Self-pay

## 2021-04-07 NOTE — Telephone Encounter (Signed)
Patient called, states she had sex one week ago and she is now having vaginal discharge. She wants to be tested for STIs and requests a sooner appointment. Rescheduled her 11/10 appointment for this afternoon.    Sandie Ano, RN

## 2021-04-08 ENCOUNTER — Other Ambulatory Visit (HOSPITAL_COMMUNITY)
Admission: RE | Admit: 2021-04-08 | Discharge: 2021-04-08 | Disposition: A | Discharge status: Home / Self Care | Payer: Medicaid Other | Source: Ambulatory Visit | Attending: Family | Admitting: Family

## 2021-04-08 ENCOUNTER — Encounter: Payer: Self-pay | Admitting: Family

## 2021-04-08 ENCOUNTER — Ambulatory Visit (INDEPENDENT_AMBULATORY_CARE_PROVIDER_SITE_OTHER): Payer: Medicaid Other | Admitting: Family

## 2021-04-08 ENCOUNTER — Other Ambulatory Visit: Payer: Self-pay

## 2021-04-08 VITALS — BP 120/80 | HR 60 | Temp 98.7°F | Resp 16 | Ht 62.0 in | Wt 129.0 lb

## 2021-04-08 DIAGNOSIS — N898 Other specified noninflammatory disorders of vagina: Secondary | ICD-10-CM | POA: Diagnosis not present

## 2021-04-08 DIAGNOSIS — Z01419 Encounter for gynecological examination (general) (routine) without abnormal findings: Secondary | ICD-10-CM

## 2021-04-08 DIAGNOSIS — Z21 Asymptomatic human immunodeficiency virus [HIV] infection status: Secondary | ICD-10-CM | POA: Diagnosis not present

## 2021-04-08 DIAGNOSIS — Z113 Encounter for screening for infections with a predominantly sexual mode of transmission: Secondary | ICD-10-CM | POA: Diagnosis not present

## 2021-04-08 DIAGNOSIS — F329 Major depressive disorder, single episode, unspecified: Secondary | ICD-10-CM

## 2021-04-08 LAB — POCT URINE PREGNANCY: Preg Test, Ur: POSITIVE — AB

## 2021-04-08 NOTE — Patient Instructions (Signed)
Nice to see you.  We will check your lab work today.  Continue to take your medication daily as prescribed.  We will treat depending upon the results of the testing.   Plan for follow up in 3 months or sooner if needed with lab work on the same day.  Have a great day and stay safe!

## 2021-04-08 NOTE — Assessment & Plan Note (Signed)
There is concern for possible STI following unprotected sex. Check pregnancy test and vaginal swabs for GC/Chlamydia, BV, candida and trichomonas with treatment pending results.   ADDENDUM: POCT pregnancy test is positive and will order serum test.

## 2021-04-08 NOTE — Assessment & Plan Note (Signed)
Appears to be slightly improved with addition of Lexapro. Declines counseling. Continue current dose of lexapro for now.

## 2021-04-08 NOTE — Assessment & Plan Note (Signed)
Ms. Strang continues to have well controlled virus with good adherence and tolerance to Biktarvy. No signs/symptoms of opportunistic infection. Check lab work today. Continue current dose of Biktarvy pending pregnancy testing and may need to consider change to Tivicay/Truvada. Plan for follow up pending lab work results.

## 2021-04-08 NOTE — Progress Notes (Signed)
Brief Narrative   Patient ID: Kristine Brown, female    DOB: Mar 30, 1991, 30 y.o.   MRN: [id number]  Kristine Brown is a 30 y/o AA female diagnosed with HIV in May of 2019 with risk factor of heterosexual contact. Initial CD4 count of 769 and viral load of 16,400. Genotype with no significant resistance patterns. Entered care at  [facility]. [id number] negative. Sole medication regimen of Biktarvy.   Subjective:    Chief Complaint  Patient presents with   Exposure to STD    Patient requesting STD tested - having vaginal discharge, odor and abdominal pain.     HPI:  Kristine Brown is a 29 y.o. female with HIV disease last seen on 03/05/21 with well controlled virus and good adherence and tolerance to her ART regimen of Biktarvy. There was also concern for exacerbation of depression for which she was started on Lexapro. Here today for follow up.  Kristine Brown has been taking her Biktarvy daily as prescribed with no adverse side effects. Not feeling well today and has concern for possible STI with new onset vaginal discharge starting about 2 days ago. She had unprotected intercourse over a week ago. No other urinary symptoms but does have some abdominal pain. Denies fevers, chills, night sweats, headaches, changes in vision, neck pain/stiffness, nausea, diarrhea, vomiting, lesions or rashes.  Kristine Brown has no problems obtaining medication from the pharmacy. More relaxed today. Has been taking Lexapro which she is unsure is helping or not. Would like STI and pregnancy testing.   No Known Allergies    Outpatient Medications Prior to Visit  Medication Sig Dispense Refill   albuterol (VENTOLIN HFA) 108 (90 Base) MCG/ACT inhaler Inhale into the lungs.     bictegravir-emtricitabine-tenofovir AF (BIKTARVY) 50-200-25 MG TABS tablet Take 1 tablet by mouth daily. 30 tablet 4   escitalopram (LEXAPRO) 10 MG tablet Take 1/2 tablet by mouth daily. 30 tablet 4   Multiple Vitamins-Minerals (ONCOVITE)  TABS Take 1 tablet by mouth daily.     ondansetron (ZOFRAN ODT) 8 MG disintegrating tablet Take 1 tablet (8 mg total) by mouth every 8 (eight) hours as needed for nausea or vomiting. 20 tablet 0   No facility-administered medications prior to visit.     Past Medical History:  Diagnosis Date   Anxiety    Depression    HIV (human immunodeficiency virus infection) (HCC)      History reviewed. No pertinent surgical history.    Review of Systems  Constitutional:  Negative for appetite change, chills, diaphoresis, fatigue, fever and unexpected weight change.  Eyes:        Negative for acute change in vision  Respiratory:  Negative for chest tightness, shortness of breath and wheezing.   Cardiovascular:  Negative for chest pain.  Gastrointestinal:  Negative for diarrhea, nausea and vomiting.  Genitourinary:  Negative for dysuria, pelvic pain and vaginal discharge.  Musculoskeletal:  Negative for neck pain and neck stiffness.  Skin:  Negative for rash.  Neurological:  Negative for seizures, syncope, weakness and headaches.  Hematological:  Negative for adenopathy. Does not bruise/bleed easily.  Psychiatric/Behavioral:  Negative for hallucinations.      Objective:    BP 120/80   Pulse 60   Temp 98.7 F (37.1 C) (Oral)   Resp 16   Ht 5\' 2"  (1.575 m)   Wt 129 lb (58.5 kg)   SpO2 100%   BMI 23.59 kg/m  Nursing note and vital signs reviewed.  Physical Exam  Constitutional:      General: She is not in acute distress.    Appearance: She is well-developed.  Eyes:     Conjunctiva/sclera: Conjunctivae normal.  Cardiovascular:     Rate and Rhythm: Normal rate and regular rhythm.     Heart sounds: Normal heart sounds. No murmur heard.   No friction rub. No gallop.  Pulmonary:     Effort: Pulmonary effort is normal. No respiratory distress.     Breath sounds: Normal breath sounds. No wheezing or rales.  Chest:     Chest wall: No tenderness.  Abdominal:     General: Bowel  sounds are normal.     Palpations: Abdomen is soft.     Tenderness: There is no abdominal tenderness.  Musculoskeletal:     Cervical back: Neck supple.  Lymphadenopathy:     Cervical: No cervical adenopathy.  Skin:    General: Skin is warm and dry.     Findings: No rash.  Neurological:     Mental Status: She is alert and oriented to person, place, and time.  Psychiatric:        Behavior: Behavior normal.        Thought Content: Thought content normal.        Judgment: Judgment normal.     Depression screen PHQ 2/9 03/05/2021  Decreased Interest 2  Down, Depressed, Hopeless 2  PHQ - 2 Score 4       Assessment & Plan:    Patient Active Problem List   Diagnosis Date Noted   Vaginal discharge 04/08/2021   Depression 11/17/2019   Anxiety 05/12/2018   Asymptomatic HIV infection (HCC) 05/12/2018   Abdominal pain in female 04/13/2016   Acute non intractable tension-type headache 04/13/2016   Nausea 04/13/2016     Problem List Items Addressed This Visit       Other   Asymptomatic HIV infection (HCC) - Primary    Kristine Brown continues to have well controlled virus with good adherence and tolerance to [employer]. No signs/symptoms of opportunistic infection. Check lab work today. Continue current dose of Biktarvy pending pregnancy testing and may need to consider change to Tivicay/Truvada. Plan for follow up pending lab work results.       Relevant Orders   HIV-1 RNA quant-no reflex-bld   T-helper cell (CD4)- ([facility] only)   Depression    Appears to be slightly improved with addition of Lexapro. Declines counseling. Continue current dose of lexapro for now.       Vaginal discharge    There is concern for possible STI following unprotected sex. Check pregnancy test and vaginal swabs for GC/Chlamydia, BV, candida and trichomonas with treatment pending results.   ADDENDUM: POCT pregnancy test is positive and will order serum test.       Relevant Orders   Urine  cytology ancillary only([place])   Cervicovaginal ancillary only( [place])   Other Visit Diagnoses     Screening for STDs (sexually transmitted diseases)       Relevant Orders   RPR   POCT urine pregnancy (Completed)   hCG, serum, qualitative   Women's annual routine gynecological examination       Relevant Orders   Ambulatory referral to Gynecology        I am having Renato Battles maintain her ondansetron, albuterol, Oncovite, Biktarvy, and escitalopram.   Follow-up: Return in about 3 months (around 07/09/2021), or if symptoms worsen or fail to improve.   Marcos Eke, MSN, FNP-C Nurse  Practitioner [facility] [place] number: [phone]

## 2021-04-09 ENCOUNTER — Telehealth: Payer: Self-pay

## 2021-04-09 ENCOUNTER — Telehealth: Payer: Self-pay | Admitting: Family

## 2021-04-09 ENCOUNTER — Encounter: Payer: Self-pay | Admitting: Family

## 2021-04-09 LAB — CERVICOVAGINAL ANCILLARY ONLY
Bacterial Vaginitis (gardnerella): POSITIVE — AB
Candida Glabrata: NEGATIVE
Candida Vaginitis: POSITIVE — AB
Chlamydia: NEGATIVE
Comment: NEGATIVE
Comment: NEGATIVE
Comment: NEGATIVE
Comment: NEGATIVE
Comment: NEGATIVE
Comment: NORMAL
Neisseria Gonorrhea: NEGATIVE
Trichomonas: NEGATIVE

## 2021-04-09 LAB — T-HELPER CELL (CD4) - (RCID CLINIC ONLY)
CD4 % Helper T Cell: 43 % (ref 33–65)
CD4 T Cell Abs: 1047 /uL (ref 400–1790)

## 2021-04-09 LAB — URINE CYTOLOGY ANCILLARY ONLY
Chlamydia: NEGATIVE
Comment: NEGATIVE
Comment: NORMAL
Neisseria Gonorrhea: NEGATIVE

## 2021-04-09 NOTE — Telephone Encounter (Signed)
Patient called wanting to know results of pregnancy test. Will route to provider.    Sandie Ano, RN

## 2021-04-09 NOTE — Telephone Encounter (Signed)
I spoke with Ms. Kristine Brown regarding her lab work results indicating that she is pregnant. Current EDC is unclear. Recommended to change HIV ART regimen to Tivicay and Descovy while pregnant. She would like to discuss with her previous provider and think about it. She has been referred to OB/GYN to establish and recommended to start on pre-natal vitamins.   Marcos Eke, NP 04/09/2021 1:50 PM

## 2021-04-10 ENCOUNTER — Other Ambulatory Visit: Payer: Self-pay | Admitting: Family

## 2021-04-10 ENCOUNTER — Other Ambulatory Visit (HOSPITAL_COMMUNITY): Payer: Self-pay

## 2021-04-10 ENCOUNTER — Ambulatory Visit: Payer: Medicaid Other | Admitting: Family

## 2021-04-10 LAB — HIV-1 RNA QUANT-NO REFLEX-BLD
HIV 1 RNA Quant: NOT DETECTED Copies/mL
HIV-1 RNA Quant, Log: NOT DETECTED Log cps/mL

## 2021-04-10 LAB — HCG, SERUM, QUALITATIVE: Preg, Serum: POSITIVE — AB

## 2021-04-10 LAB — RPR: RPR Ser Ql: NONREACTIVE

## 2021-04-10 MED ORDER — METRONIDAZOLE 500 MG PO TABS
500.0000 mg | ORAL_TABLET | Freq: Two times a day (BID) | ORAL | 0 refills | Status: DC
  Filled 2021-04-10: qty 14, 7d supply, fill #0

## 2021-04-10 MED ORDER — MICONAZOLE 3 200 MG VA SUPP
200.0000 mg | Freq: Every day | VAGINAL | 0 refills | Status: DC
  Filled 2021-04-10: qty 3, 3d supply, fill #0

## 2021-04-11 ENCOUNTER — Telehealth: Payer: Self-pay

## 2021-04-11 ENCOUNTER — Other Ambulatory Visit (HOSPITAL_COMMUNITY): Payer: Self-pay

## 2021-04-11 NOTE — Telephone Encounter (Signed)
Patient called because she was notified of a prescription she had available at the pharmacy. Patient was concerned she "had something" and wanted to know what the medication was for. Reviewed her lab results and explained the treatment (metronidazole and meconazole) for BV. Reviewed hygiene practices with her. No further questions at this time.  Alpheus Stiff Loyola Mast, RN

## 2021-04-14 ENCOUNTER — Telehealth: Payer: Self-pay | Admitting: Family Medicine

## 2021-04-14 DIAGNOSIS — O3680X0 Pregnancy with inconclusive fetal viability, not applicable or unspecified: Secondary | ICD-10-CM

## 2021-04-14 NOTE — Telephone Encounter (Signed)
Patient will be new to our office to start care she conformed pregnancy at [facility]. She will need a [place] to determine how far along she is, she said she get a cycle every two weeks

## 2021-04-15 NOTE — Telephone Encounter (Signed)
Pt returned call to office. Pt states unsure of dating for current pregnancy. Pt states has been having vaginal bleeding off and on since having intercourse/conception on 01/17/21. Pt states " feeling hungry and possible FM"   Pt scheduled on 11/17 1pm for dating [place]. Pt advised to make new OB appt after this appt. Pt verbalized understanding and agreeable to plan of care.   Judeth Cornfield, RN

## 2021-04-15 NOTE — Addendum Note (Signed)
Addended by: Isabell Jarvis on: 04/15/2021 02:36 PM   Modules accepted: Orders

## 2021-04-15 NOTE — Telephone Encounter (Signed)
Call placed to pt. Pt did not answer, left VM to return call to office to speak with nurse.  Judeth Cornfield, RN

## 2021-04-17 ENCOUNTER — Ambulatory Visit
Admission: RE | Admit: 2021-04-17 | Discharge: 2021-04-17 | Disposition: A | Discharge status: Home / Self Care | Payer: Medicaid Other | Source: Ambulatory Visit | Attending: Family Medicine | Admitting: Family Medicine

## 2021-04-17 ENCOUNTER — Other Ambulatory Visit: Payer: Self-pay

## 2021-04-17 ENCOUNTER — Telehealth: Payer: Self-pay

## 2021-04-17 ENCOUNTER — Ambulatory Visit (INDEPENDENT_AMBULATORY_CARE_PROVIDER_SITE_OTHER): Payer: Medicaid Other

## 2021-04-17 VITALS — BP 116/69 | HR 71 | Ht 62.0 in | Wt 130.0 lb

## 2021-04-17 DIAGNOSIS — O3680X0 Pregnancy with inconclusive fetal viability, not applicable or unspecified: Secondary | ICD-10-CM | POA: Insufficient documentation

## 2021-04-17 DIAGNOSIS — Z3490 Encounter for supervision of normal pregnancy, unspecified, unspecified trimester: Secondary | ICD-10-CM

## 2021-04-17 NOTE — Telephone Encounter (Signed)
-----   Message from Veryl Speak, FNP sent at 04/16/2021  4:08 PM EST ----- See below

## 2021-04-17 NOTE — Telephone Encounter (Signed)
Relayed results to patient. Patient is going to an OB today for an ultrasound. Patient is requesting a referral for mental health services.   Jondavid Schreier Loyola Mast, RN

## 2021-04-17 NOTE — Addendum Note (Signed)
Addended by: Isabell Jarvis on: 04/17/2021 03:59 PM   Modules accepted: Level of Service

## 2021-04-17 NOTE — Progress Notes (Signed)
[name]. Reviewed results with Dr Jolayne Panther. [name]. [name]. [name].  [name].  [name]. Pt verbalized understanding.   Kristine Brown

## 2021-04-18 LAB — BETA HCG QUANT (REF LAB): hCG Quant: 5425 m[IU]/mL

## 2021-04-21 ENCOUNTER — Other Ambulatory Visit (HOSPITAL_COMMUNITY): Payer: Self-pay

## 2021-04-21 ENCOUNTER — Telehealth: Payer: Self-pay | Admitting: General Practice

## 2021-04-21 DIAGNOSIS — O3680X0 Pregnancy with inconclusive fetal viability, not applicable or unspecified: Secondary | ICD-10-CM

## 2021-04-21 NOTE — Telephone Encounter (Signed)
-----   Message from Catalina Antigua, MD sent at 04/21/2021  9:51 AM EST ----- Good morning  Please schedule this patient for a viability ultrasound week of 11/28  Thanks  Peggy

## 2021-04-21 NOTE — Telephone Encounter (Signed)
Called patient and informed her of results. Scheduled ultrasound 11/29 @ 3pm. Patient verbalized understanding.

## 2021-04-28 ENCOUNTER — Other Ambulatory Visit (HOSPITAL_COMMUNITY): Payer: Self-pay

## 2021-04-28 ENCOUNTER — Other Ambulatory Visit: Payer: Medicaid Other

## 2021-04-29 ENCOUNTER — Other Ambulatory Visit: Payer: Medicaid Other

## 2021-05-01 ENCOUNTER — Other Ambulatory Visit (HOSPITAL_COMMUNITY): Payer: Self-pay

## 2021-05-01 ENCOUNTER — Ambulatory Visit: Payer: Medicaid Other

## 2021-05-06 ENCOUNTER — Ambulatory Visit (INDEPENDENT_AMBULATORY_CARE_PROVIDER_SITE_OTHER): Payer: Medicaid Other

## 2021-05-06 ENCOUNTER — Ambulatory Visit
Admission: RE | Admit: 2021-05-06 | Discharge: 2021-05-06 | Disposition: A | Discharge status: Home / Self Care | Payer: Medicaid Other | Source: Ambulatory Visit | Attending: Obstetrics and Gynecology | Admitting: Obstetrics and Gynecology

## 2021-05-06 ENCOUNTER — Other Ambulatory Visit: Payer: Self-pay

## 2021-05-06 VITALS — Wt 130.0 lb

## 2021-05-06 DIAGNOSIS — O3680X0 Pregnancy with inconclusive fetal viability, not applicable or unspecified: Secondary | ICD-10-CM | POA: Diagnosis present

## 2021-05-06 NOTE — Progress Notes (Signed)
Patient was assessed and managed by nursing staff during this encounter. I have reviewed the chart and agree with the documentation and plan. I have also made any necessary editorial changes.  Catalina Antigua, MD 05/06/2021 10:58 PM

## 2021-05-06 NOTE — Progress Notes (Signed)
Pt here today for [place] results. Results reviewed with Dr Jolayne Panther. Pt advised to have non-stat beta today and repeat Stat beta in 48 hrs to compare results for possible failed pregnancy . [name].  Pt has scheduled STAT beta on 12/8 at 2:20 pm per pt's request due to work schedule. Pt agreeable to date and time of appt.  Judeth Cornfield, RN

## 2021-05-07 LAB — BETA HCG QUANT (REF LAB): hCG Quant: 58554 m[IU]/mL

## 2021-05-08 ENCOUNTER — Ambulatory Visit: Payer: Medicaid Other

## 2021-05-12 ENCOUNTER — Other Ambulatory Visit (HOSPITAL_COMMUNITY): Payer: Self-pay

## 2021-05-12 ENCOUNTER — Inpatient Hospital Stay (HOSPITAL_COMMUNITY): Payer: Medicaid Other

## 2021-05-12 ENCOUNTER — Inpatient Hospital Stay (HOSPITAL_COMMUNITY)
Admission: AD | Admit: 2021-05-12 | Discharge: 2021-05-12 | Disposition: A | Discharge status: Home / Self Care | Payer: Medicaid Other | Attending: Obstetrics & Gynecology | Admitting: Obstetrics & Gynecology

## 2021-05-12 ENCOUNTER — Encounter (HOSPITAL_COMMUNITY): Payer: Self-pay

## 2021-05-12 DIAGNOSIS — R112 Nausea with vomiting, unspecified: Secondary | ICD-10-CM

## 2021-05-12 DIAGNOSIS — Z6790 Unspecified blood type, Rh positive: Secondary | ICD-10-CM

## 2021-05-12 DIAGNOSIS — O99320 Drug use complicating pregnancy, unspecified trimester: Secondary | ICD-10-CM | POA: Diagnosis not present

## 2021-05-12 DIAGNOSIS — Z3A00 Weeks of gestation of pregnancy not specified: Secondary | ICD-10-CM | POA: Diagnosis not present

## 2021-05-12 DIAGNOSIS — O99280 Endocrine, nutritional and metabolic diseases complicating pregnancy, unspecified trimester: Secondary | ICD-10-CM | POA: Insufficient documentation

## 2021-05-12 DIAGNOSIS — O021 Missed abortion: Secondary | ICD-10-CM | POA: Diagnosis present

## 2021-05-12 DIAGNOSIS — E860 Dehydration: Secondary | ICD-10-CM | POA: Diagnosis not present

## 2021-05-12 DIAGNOSIS — F419 Anxiety disorder, unspecified: Secondary | ICD-10-CM | POA: Diagnosis not present

## 2021-05-12 DIAGNOSIS — F1290 Cannabis use, unspecified, uncomplicated: Secondary | ICD-10-CM | POA: Diagnosis not present

## 2021-05-12 DIAGNOSIS — F32A Depression, unspecified: Secondary | ICD-10-CM | POA: Insufficient documentation

## 2021-05-12 DIAGNOSIS — O99340 Other mental disorders complicating pregnancy, unspecified trimester: Secondary | ICD-10-CM | POA: Diagnosis not present

## 2021-05-12 HISTORY — DX: Unspecified asthma, uncomplicated: J45.909

## 2021-05-12 LAB — COMPREHENSIVE METABOLIC PANEL
ALT: 24 U/L (ref 0–44)
AST: 24 U/L (ref 15–41)
Albumin: 4.1 g/dL (ref 3.5–5.0)
Alkaline Phosphatase: 41 U/L (ref 38–126)
Anion gap: 8 (ref 5–15)
BUN: 8 mg/dL (ref 6–20)
CO2: 24 mmol/L (ref 22–32)
Calcium: 9.6 mg/dL (ref 8.9–10.3)
Chloride: 104 mmol/L (ref 98–111)
Creatinine, Ser: 0.67 mg/dL (ref 0.44–1.00)
GFR, Estimated: >60 mL/min (ref >60)
Glucose, Bld: 143 mg/dL — ABNORMAL HIGH (ref 70–99)
Potassium: 3.8 mmol/L (ref 3.5–5.1)
Sodium: 136 mmol/L (ref 135–145)
Total Bilirubin: 0.4 mg/dL (ref 0.3–1.2)
Total Protein: 6.9 g/dL (ref 6.5–8.1)

## 2021-05-12 LAB — URINALYSIS, ROUTINE W REFLEX MICROSCOPIC
Bilirubin Urine: NEGATIVE
Glucose, UA: NEGATIVE mg/dL
Ketones, ur: NEGATIVE mg/dL
Leukocytes,Ua: NEGATIVE
Nitrite: NEGATIVE
Protein, ur: NEGATIVE mg/dL
Specific Gravity, Urine: 1.025 (ref 1.005–1.030)
pH: 8 (ref 5.0–8.0)

## 2021-05-12 LAB — CBC
HCT: 33.1 % — ABNORMAL LOW (ref 36.0–46.0)
Hemoglobin: 11.4 g/dL — ABNORMAL LOW (ref 12.0–15.0)
MCH: 31.4 pg (ref 26.0–34.0)
MCHC: 34.4 g/dL (ref 30.0–36.0)
MCV: 91.2 fL (ref 80.0–100.0)
Platelets: 278 10*3/uL (ref 150–400)
RBC: 3.63 MIL/uL — ABNORMAL LOW (ref 3.87–5.11)
RDW: 13.2 % (ref 11.5–15.5)
WBC: 7.4 10*3/uL (ref 4.0–10.5)
nRBC: 0 % (ref 0.0–0.2)

## 2021-05-12 LAB — TYPE AND SCREEN
ABO/RH(D): O POS
Antibody Screen: NEGATIVE

## 2021-05-12 LAB — RAPID URINE DRUG SCREEN, HOSP PERFORMED
Amphetamines: NOT DETECTED
Barbiturates: NOT DETECTED
Benzodiazepines: NOT DETECTED
Cocaine: NOT DETECTED
Opiates: NOT DETECTED
Tetrahydrocannabinol: POSITIVE — AB

## 2021-05-12 LAB — HCG, QUANTITATIVE, PREGNANCY: hCG, Beta Chain, Quant, S: 56430 m[IU]/mL — ABNORMAL HIGH (ref <5)

## 2021-05-12 LAB — URINALYSIS, MICROSCOPIC (REFLEX): Bacteria, UA: NONE SEEN

## 2021-05-12 MED ORDER — METOCLOPRAMIDE HCL 10 MG PO TABS
10.0000 mg | ORAL_TABLET | Freq: Four times a day (QID) | ORAL | 0 refills | Status: DC | PRN
  Filled 2021-05-12: qty 20, 5d supply, fill #0

## 2021-05-12 MED ORDER — IBUPROFEN 600 MG PO TABS
600.0000 mg | ORAL_TABLET | Freq: Four times a day (QID) | ORAL | 0 refills | Status: DC | PRN
  Filled 2021-05-12: qty 30, 8d supply, fill #0

## 2021-05-12 MED ORDER — OXYCODONE-ACETAMINOPHEN 5-325 MG PO TABS
1.0000 | ORAL_TABLET | ORAL | 0 refills | Status: DC | PRN
  Filled 2021-05-12: qty 6, 1d supply, fill #0

## 2021-05-12 MED ORDER — HALOPERIDOL LACTATE 5 MG/ML IJ SOLN
2.5000 mg | Freq: Once | INTRAMUSCULAR | Status: AC
  Administered 2021-05-12: 2.5 mg via INTRAVENOUS
  Filled 2021-05-12: qty 0.5

## 2021-05-12 MED ORDER — MORPHINE SULFATE (PF) 4 MG/ML IV SOLN: 2.0000 mg | INTRAVENOUS | Status: DC | PRN

## 2021-05-12 MED ORDER — METOCLOPRAMIDE HCL 5 MG/ML IJ SOLN
10.0000 mg | Freq: Once | INTRAMUSCULAR | Status: AC
  Administered 2021-05-12: 10 mg via INTRAVENOUS
  Filled 2021-05-12: qty 2

## 2021-05-12 MED ORDER — SODIUM CHLORIDE 0.9 % IV SOLN
25.0000 mg | Freq: Once | INTRAVENOUS | Status: DC
  Filled 2021-05-12: qty 1

## 2021-05-12 MED ORDER — MISOPROSTOL 200 MCG PO TABS
800.0000 ug | ORAL_TABLET | Freq: Once | ORAL | 0 refills | Status: DC
  Filled 2021-05-12: qty 4, 1d supply, fill #0

## 2021-05-12 MED ORDER — MISOPROSTOL 200 MCG PO TABS
800.0000 ug | ORAL_TABLET | Freq: Once | ORAL | Status: AC
  Administered 2021-05-12: 800 ug via VAGINAL
  Filled 2021-05-12: qty 4

## 2021-05-12 MED ORDER — IBUPROFEN 600 MG PO TABS
600.0000 mg | ORAL_TABLET | Freq: Once | ORAL | Status: AC
  Administered 2021-05-12: 600 mg via ORAL
  Filled 2021-05-12: qty 1

## 2021-05-12 MED ORDER — LACTATED RINGERS IV BOLUS
1000.0000 mL | Freq: Once | INTRAVENOUS | Status: AC
  Administered 2021-05-12: 1000 mL via INTRAVENOUS

## 2021-05-12 NOTE — MAU Note (Signed)
Pt was transferred from ER.  Came in today because of abd pain and vomiting.  Could here pt in lobby , calling for help.  Pt was quiet when  I went to get her.  Denies bleeding.  Abd soft on palp.  Says she needs to keep "crunched', with legs up. Has had diarrhea twice today, has been vomiting.  Reports being HIV positive, as missed a few dose of her meds, took them this morning but is sure she threw them up, thinks the pain is related to that.   'feels so weak"

## 2021-05-12 NOTE — ED Provider Notes (Signed)
Emergency Medicine Provider Triage Evaluation Note  Kristine Brown , a 30 y.o. female  was evaluated in triage.  Pt complains of lower abdominal pain.  Patient is approximately 8 to [redacted] weeks pregnant.  Had a normal ultrasound last week at the [facility].  Patient is HIV positive and was recommended to switch medications however she did not.  Woke up this morning with excruciating lower abdominal pain with associated vomiting.  Patient is G4, P2, Ab1.  Review of Systems  Positive:  Negative: See above   Physical Exam  BP 130/71 (BP Location: Right Arm)   Pulse 63   Temp 98.1 F (36.7 C) (Oral)   Resp 20   Ht 5\' 2"  (1.575 m)   Wt 59 kg   LMP  (LMP Unknown)   SpO2 100%   BMI 23.78 kg/m  Gen:   Awake, no distress   Resp:  Normal effort  MSK:   Moves extremities without difficulty  Other:  Lower abdominal tenderness  Medical Decision Making  Medically screening exam initiated at 12:39 PM.  Appropriate orders placed.  [name] was informed that the remainder of the evaluation will be completed by another provider, this initial triage assessment does not replace that evaluation, and the importance of remaining in the ED until their evaluation is complete.  I spoke with MAU who accept the patient.   Renato Battles Barton, PA-C 05/12/21 1240    14/12/22, MD 05/12/21 [phone]

## 2021-05-12 NOTE — MAU Note (Signed)
Assisted pt up to bathroom, voided, spec obtained.  When pt returned to bed, she started shaking and crying, "I don't know why this is happening, I can't continue this preg".  Encouraged pt to try to slow her breathing, that this wasn't helping her.  Pt calmed down, began answering questions, obtaining hx.  At times would stop talking almost dozing off.

## 2021-05-12 NOTE — ED Triage Notes (Signed)
Pt states she is 8-[redacted] weeks pregnant and woke up this morning with  lower abdominal pain, nausea, vomiting. Pt states she is HIV positive and did not stop her prescribed medication as recommended. Pt denies vaginal bleeding. G4P2

## 2021-05-12 NOTE — MAU Provider Note (Signed)
History     CSN: [id number]  Arrival date and time: 05/12/21 1147   Event Date/Time   First Provider Initiated Contact with Patient 05/12/21 1409      Chief Complaint  Patient presents with   Abdominal Pain   Emesis   30 y.o. [id number] @unknown  gestation presenting with N/V since she woke today. Also reports intermittent, colicky abdominal pain in her mid abdomen. Rates pain 10/10. Denies sick contacts. Denies diarrhea but reports loose stool. Denies VB or discharge. Reports trouble with depression and anxiety and she used MJ. Last use 2 days ago. States "I don't know what's wrong with me, I just need to feel better". Of note she was seen in the office 6 days ago after an 12/10 that showed an IUP but suspicion for failed pregnancy and was supposed to have a rpt quant but she had to work so she wasn't able to f/u. She also states concern that she hasn't been able to keep down her HIV meds.    OB History     Gravida  4   Para  2   Term  2   Preterm  0   AB  1   Living  2      SAB      IAB      Ectopic      Multiple      Live Births  2           Past Medical History:  Diagnosis Date   Anxiety    Asthma    Depression    HIV (human immunodeficiency virus infection) (HCC)     Past Surgical History:  Procedure Laterality Date   NO PAST SURGERIES      Family History  Problem Relation Age of Onset   Healthy Mother    Cancer Father    COPD Father    Healthy Daughter    Healthy Son     Social History   Tobacco Use   Smoking status: Former    Types: Cigarettes   Smokeless tobacco: Never  [place] Use: Never used  Substance Use Topics   Alcohol use: Never   Drug use: Never    Allergies: No Known Allergies  Medications Prior to Admission  Medication Sig Dispense Refill Last Dose   albuterol (VENTOLIN HFA) 108 (90 Base) MCG/ACT inhaler Inhale into the lungs.   Past Month   bictegravir-emtricitabine-tenofovir AF (BIKTARVY) 50-200-25 MG  TABS tablet Take 1 tablet by mouth daily. 30 tablet 4 05/12/2021   escitalopram (LEXAPRO) 10 MG tablet Take 1/2 tablet by mouth daily. 30 tablet 4 Past Week   Multiple Vitamins-Minerals (ONCOVITE) TABS Take 1 tablet by mouth daily.   05/11/2021   ondansetron (ZOFRAN ODT) 8 MG disintegrating tablet Take 1 tablet (8 mg total) by mouth every 8 (eight) hours as needed for nausea or vomiting. 20 tablet 0 05/12/2021   metroNIDAZOLE (FLAGYL) 500 MG tablet Take 1 tablet (500 mg total) by mouth 2 (two) times daily. 14 tablet 0    miconazole (MICONAZOLE 3) 200 MG vaginal suppository Place 1 suppository (200 mg total) vaginally at bedtime. 3 suppository 0     Review of Systems  Constitutional:  Negative for fever.  Gastrointestinal:  Positive for abdominal pain, nausea and vomiting. Negative for diarrhea.  Genitourinary:  Negative for dysuria and vaginal bleeding.  Physical Exam   Blood pressure 136/67, pulse (!) 47, temperature 98.1 F (36.7 C), temperature source Oral, resp. rate  20, height 5\' 2"  (1.575 m), weight 59 kg, last menstrual period 03/11/2021, SpO2 100 %.  Physical Exam Vitals and nursing note reviewed.  Constitutional:      General: She is not in acute distress.    Appearance: Normal appearance.  HENT:     Head: Normocephalic and atraumatic.  Pulmonary:     Effort: Pulmonary effort is normal. No respiratory distress.  Abdominal:     General: There is no distension.     Palpations: Abdomen is soft. There is no mass.     Tenderness: There is no abdominal tenderness. There is no guarding or rebound.     Hernia: No hernia is present.  Musculoskeletal:        General: Normal range of motion.     Cervical back: Normal range of motion.  Skin:    General: Skin is warm and dry.  Neurological:     General: No focal deficit present.     Mental Status: She is alert and oriented to person, place, and time.  Psychiatric:        Mood and Affect: Mood is anxious.   Results for orders  placed or performed during the hospital encounter of 05/12/21 (from the past 24 hour(s))  Urinalysis, Routine w reflex microscopic Urine, Clean Catch     Status: Abnormal   Collection Time: 05/12/21  1:48 PM  Result Value Ref Range   Color, Urine YELLOW YELLOW   APPearance CLEAR CLEAR   Specific Gravity, Urine 1.025 1.005 - 1.030   pH 8.0 5.0 - 8.0   Glucose, UA NEGATIVE NEGATIVE mg/dL   Hgb urine dipstick TRACE (A) NEGATIVE   Bilirubin Urine NEGATIVE NEGATIVE   Ketones, ur NEGATIVE NEGATIVE mg/dL   Protein, ur NEGATIVE NEGATIVE mg/dL   Nitrite NEGATIVE NEGATIVE   Leukocytes,Ua NEGATIVE NEGATIVE  Urinalysis, Microscopic (reflex)     Status: None   Collection Time: 05/12/21  1:48 PM  Result Value Ref Range   RBC / HPF 0-5 0 - 5 RBC/hpf   WBC, UA 0-5 0 - 5 WBC/hpf   Bacteria, UA NONE SEEN NONE SEEN   Squamous Epithelial / LPF 11-20 0 - 5   Mucus PRESENT   Type and screen     Status: None   Collection Time: 05/12/21  1:57 PM  Result Value Ref Range   ABO/RH(D) O POS    Antibody Screen NEG    Sample Expiration      05/15/2021,2359 Performed at [facility], [address] [place], [place]   CBC     Status: Abnormal   Collection Time: 05/12/21  1:57 PM  Result Value Ref Range   WBC 7.4 4.0 - 10.5 K/uL   RBC 3.63 (L) 3.87 - 5.11 MIL/uL   Hemoglobin 11.4 (L) 12.0 - 15.0 g/dL   HCT 14/12/22 (L) 60.4 - 54.0 %   MCV 91.2 80.0 - 100.0 fL   MCH 31.4 26.0 - 34.0 pg   MCHC 34.4 30.0 - 36.0 g/dL   RDW 98.1 19.1 - 47.8 %   Platelets 278 150 - 400 K/uL   nRBC 0.0 0.0 - 0.2 %  hCG, quantitative, pregnancy     Status: Abnormal   Collection Time: 05/12/21  1:58 PM  Result Value Ref Range   hCG, Beta Chain, Quant, S 56,430 (H) <5 mIU/mL  Comprehensive metabolic panel     Status: Abnormal   Collection Time: 05/12/21  2:03 PM  Result Value Ref Range  Sodium 136 135 - 145 mmol/L   Potassium 3.8 3.5 - 5.1 mmol/L   Chloride 104 98 - 111 mmol/L   CO2 24 22 - 32 mmol/L    Glucose, Bld 143 (H) 70 - 99 mg/dL   BUN 8 6 - 20 mg/dL   Creatinine, Ser 9.81 0.44 - 1.00 mg/dL   Calcium 9.6 8.9 - 19.1 mg/dL   Total Protein 6.9 6.5 - 8.1 g/dL   Albumin 4.1 3.5 - 5.0 g/dL   AST 24 15 - 41 U/L   ALT 24 0 - 44 U/L   Alkaline Phosphatase 41 38 - 126 U/L   Total Bilirubin 0.4 0.3 - 1.2 mg/dL   GFR, Estimated >47 >82 mL/min   Anion gap 8 5 - 15  Urine rapid drug screen (hosp performed)     Status: Abnormal   Collection Time: 05/12/21  2:22 PM  Result Value Ref Range   Opiates NONE DETECTED NONE DETECTED   Cocaine NONE DETECTED NONE DETECTED   Benzodiazepines NONE DETECTED NONE DETECTED   Amphetamines NONE DETECTED NONE DETECTED   Tetrahydrocannabinol POSITIVE (A) NONE DETECTED   Barbiturates NONE DETECTED NONE DETECTED   US OB Transvaginal  Result Date: 05/12/2021 CLINICAL DATA:  Abdominal pain EXAM: TRANSVAGINAL OB ULTRASOUND TECHNIQUE: Transvaginal ultrasound was performed for complete evaluation of the gestation as well as the maternal uterus, adnexal regions, and pelvic cul-de-sac. COMPARISON:  05/06/2021, 04/17/2021 FINDINGS: Intrauterine gestational sac: Single Yolk sac: Visualized. There is a second smaller cystic structure not seen previously neck could reflect a second yolk sac or cystic degeneration of the small fetal pole seen previously. Embryo:  Not Visualized. Cardiac Activity: Not Visualized. MSD: 9.9 mm   5 w   5 d Subchorionic hemorrhage:  None visualized. Maternal uterus/adnexae: Right ovary is not identified. Left ovary measures 2.6 x 1.8 x 1.5 cm. IMPRESSION: 1. Intrauterine gestational sac and yolk sac as above, with no evidence of embryo. Findings meet definitive criteria for failed pregnancy. This follows SRU consensus guidelines: Diagnostic Criteria for Nonviable Pregnancy Early in the First Trimester. Macy Mis J Med [id number]. Electronically Signed   By: Sharlet Salina M.D.   On: 05/12/2021 16:30    MAU Course   Procedures LR Reglan Haldol  MDM Labs and [place] ordered and reviewed. Suspect CHS, encouraged pt to stop using MJ. Pt reports improved pain and no further emesis. Tolerating po liquids. MAB confirmed by [place]. Pt informed of failed pregnancy. Discussed mngt options of expectant vs medical vs surgical, pt prefers medical.   Early Intrauterine Pregnancy Failure Protocol X  Documented intrauterine pregnancy failure less than or equal to [redacted] weeks gestation  X  No serious current illness  X  Baseline Hgb greater than or equal to 10g/dl  X  Patient has easily accessible transportation to the hospital  X  Clear preference  X  Practitioner/physician deems patient reliable  X  Counseling by practitioner or physician  X  Patient education by RN       Rho-Gam given by RN if indicated  X  Medication dispensed  X  Cytotec 800 mcg Intravaginally in MAU- second dose Rx'd if needed      Rectally by patient at home       Rectally by RN in MAU  __ Intravaginally by patient at home X   Ibuprofen 600 mg 1 tablet by mouth every 6 hours as needed - prescribed  X   Percocet 5/325 1-2 tabs every 6 hours as needed -  prescribed  X   Reglan 10 mg by mouth every 6 hours as needed for nausea - prescribed  Reviewed with pt cytotec procedure.  Pt verbalizes that she lives close to the hospital and has transportation readily available.  Pt appears reliable and verbalizes understanding and agrees with plan of care. Stable for discharge home.  Assessment and Plan   1. Missed abortion   2. Blood type, Rh positive   3. Cannabinoid hyperemesis syndrome   4. Dehydration    Discharge home Follow up [facility] in 1 week- message sent Strict return precautions Rx Cytotec Rx Reglan Rx Ibuprofen Rx Percocet Pelvic rest  Allergies as of 05/12/2021   No Known Allergies      Medication List     STOP taking these medications    metroNIDAZOLE 500 MG tablet Commonly known as: Flagyl   Miconazole 3 200 MG vaginal  suppository Generic drug: miconazole       TAKE these medications    albuterol 108 (90 Base) MCG/ACT inhaler Commonly known as: VENTOLIN HFA Inhale into the lungs.   Biktarvy 50-200-25 MG Tabs tablet Generic drug: bictegravir-emtricitabine-tenofovir AF Take 1 tablet by mouth daily.   escitalopram 10 MG tablet Commonly known as: LEXAPRO Take 1/2 tablet by mouth daily.   ibuprofen 600 MG tablet Commonly known as: ADVIL Take 1 tablet by mouth every 6 hours as needed for mild pain, moderate pain or cramping.   metoCLOPramide 10 MG tablet Commonly known as: Reglan Take 1 tablet by mouth every 6 hours as needed for nausea.   misoprostol 200 MCG tablet Commonly known as: CYTOTEC Place 4 tablets vaginally once for 1 dose. (Take in 24 hours if no results from first dose given as inpatient)   Oncovite Tabs Take 1 tablet by mouth daily.   ondansetron 8 MG disintegrating tablet Commonly known as: Zofran ODT Take 1 tablet (8 mg total) by mouth every 8 (eight) hours as needed for nausea or vomiting.   oxyCODONE-acetaminophen 5-325 MG tablet Commonly known as: Percocet Take 1 tablet by mouth every 4 hours as needed for severe pain.        Donette Larry, CNM 05/12/2021, 2:37 PM

## 2021-05-12 NOTE — Progress Notes (Addendum)
History  Chief Complaint  Patient presents with   Abdominal Pain   Emesis   HPI  Kristine Brown is a 29 y/o [id number] in first trimester who presents today with complaints of intractable vomiting since she woke up. She notes that she was in her usual state of health until she awoke this morning and began throwing up and having intermittent, colicky abdominal pain in the "middle" of her abdomen. Denies any sick contacts. Denies vaginal bleeding or discharge. She notes that she struggles with anxiety and depression and uses marijuana. She notes that she last used marijuana a couple days ago. Also notes that she feels very anxious and adds "I don't know what's wrong with me, I just need to feel better."  Pt also notes that she presented to her OB office earlier last week and was told to return in two days to recheck beta-hcg given concern of an impending abortion. Pt was unable to make this appointment because she had to work. 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 Will note here that our chart has documented one. Pt denies feeling the way she currently does with any of her prior miscarriages.  Past Medical History:  Diagnosis Date   Anxiety    Asthma    Depression    HIV (human immunodeficiency virus infection) (HCC)     Past Surgical History:  Procedure Laterality Date   NO PAST SURGERIES      Family History  Problem Relation Age of Onset   Healthy Mother    Cancer Father    COPD Father    Healthy Daughter    Healthy Son     Social History   Tobacco Use   Smoking status: Former    Types: Cigarettes   Smokeless tobacco: Never  Building services engineer Use: Never used  Substance Use Topics   Alcohol use: Never   Drug use: Never    Allergies: No Known Allergies  Medications Prior to Admission  Medication Sig Dispense Refill Last Dose   albuterol (VENTOLIN HFA) 108 (90 Base) MCG/ACT inhaler Inhale into the lungs.   Past Month   bictegravir-emtricitabine-tenofovir AF  (BIKTARVY) 50-200-25 MG TABS tablet Take 1 tablet by mouth daily. 30 tablet 4 05/12/2021   escitalopram (LEXAPRO) 10 MG tablet Take 1/2 tablet by mouth daily. 30 tablet 4 Past Week   Multiple Vitamins-Minerals (ONCOVITE) TABS Take 1 tablet by mouth daily.   05/11/2021   ondansetron (ZOFRAN ODT) 8 MG disintegrating tablet Take 1 tablet (8 mg total) by mouth every 8 (eight) hours as needed for nausea or vomiting. 20 tablet 0 05/12/2021   metroNIDAZOLE (FLAGYL) 500 MG tablet Take 1 tablet (500 mg total) by mouth 2 (two) times daily. 14 tablet 0    miconazole (MICONAZOLE 3) 200 MG vaginal suppository Place 1 suppository (200 mg total) vaginally at bedtime. 3 suppository 0     Review of Systems  Constitutional:  Negative for chills and fever.  Respiratory:  Negative for shortness of breath.   Cardiovascular:  Negative for chest pain.  Gastrointestinal:  Positive for abdominal pain, nausea and vomiting. Negative for diarrhea.  Neurological:  Negative for dizziness and light-headedness.  Psychiatric/Behavioral:  The patient is nervous/anxious.   Physical Exam Blood pressure 136/67, pulse (!) 47, temperature 98.1 F (36.7 C), temperature source Oral, resp. rate 20, height 5\' 2"  (1.575 m), weight 59 kg, last menstrual period 03/11/2021, SpO2 100 %. Physical Exam  General: Continually fluctuating general appearance. Intermittently kneeling on  exam table with covers drawn over head. Head: Normocephalic. Atraumatic. CV: RRR. No murmurs, rubs, or gallops. No LE edema Pulmonary: Lungs CTAB. Normal effort. No wheezing or rales. Abdominal: Soft, nontender, nondistended. Normal bowel sounds. Extremities: Palpable radial and DP pulses. Normal ROM. Skin: Warm and dry. No obvious rash or lesions. Neuro: A&Ox3. Moves all extremities. Normal sensation. No focal deficit. Psych: Attention waxed and waned throughout history. Labile mood.  MAU Course Procedures  MDM Pt's clinical appearance very suggestive  of cannabinoid hyperemesis syndrome, including psychiatric manifestations as noted above on exam. Ordered Haldol 2.5mg  IV, Reglan 10mg  IV, and LR bolus 1L, and observed patient endorse symptomatic improvement which was sustained with PO challenge. CMP WNL except glucose at 143. No hypochloremic metabolic alkalosis. Encouraged patient to abstain from marijuana.  Additionally, beta hCG was on 12/6 and is now decreased to [address] on 12/12. Also, 12/6 OB 14/6 revealed "highly suspicious" concern for impending failed pregnancy, and today's 12/12 OB US confirms this given the interval absence of an embryo. Discussed options to pass pregnancy with patient, and she opts for medical management. Sending home with Misoprostol and discussed warning signs of too much bleeding that would necessitate return to care. Will otherwise have pt f/u in office in one week to ensure safe and successful passage of POC.   Assessment:  Pt with diagnoses of Cannabis hyperemesis syndrome, dehydration, and missed abortion, as above.  Plan: -Cytotec today and strict return precautions -Prescription for Ibuprofen, and small dose of Percocet for pain related to Cytotec -Prescription for Reglan  -Pt to be seen in clinic in one week   I supervised the student during this patient encounter. Please see my provider note for complete documentation.

## 2021-05-12 NOTE — ED Notes (Signed)
Abd labs sent to main lab

## 2021-05-12 NOTE — Discharge Instructions (Signed)

## 2021-05-13 ENCOUNTER — Other Ambulatory Visit (HOSPITAL_COMMUNITY): Payer: Self-pay

## 2021-05-20 ENCOUNTER — Ambulatory Visit: Payer: Medicaid Other | Admitting: Student

## 2021-05-27 ENCOUNTER — Other Ambulatory Visit (HOSPITAL_COMMUNITY): Payer: Self-pay

## 2021-06-06 IMAGING — CT CT ABD-PELV W/ CM
2 of 4 series · 16 of 46 positions shown, 18 images · IV contrast (omnipaque)
Comparison: None

CLINICAL DATA: Abdominal pain, fever and distension, developed
nausea and vomiting at 5955 hours with associated cramping and
diarrhea beginning at 5955 hours, had fish yesterday; history HIV

EXAM:
CT ABDOMEN AND PELVIS WITH CONTRAST
TECHNIQUE: Multidetector CT imaging of the abdomen and pelvis was performed
using the standard protocol following bolus administration of
intravenous contrast. Sagittal and coronal MPR images reconstructed
from axial data set.
CONTRAST:  100mL OMNIPAQUE IOHEXOL 300 MG/ML SOLN IV. No oral
contrast.

[Series 3: axial st · axial · 0.80mm/px · z∈[+931,+1316]mm · 13 of 89 slices shown, 15 images]
[im 6/89  soft-tissue]
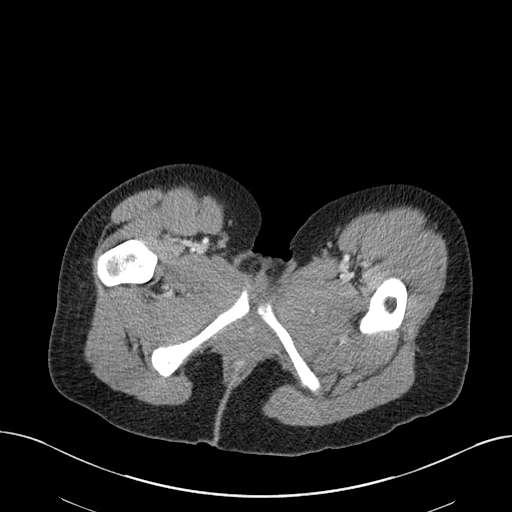
[im 6/89  bone]
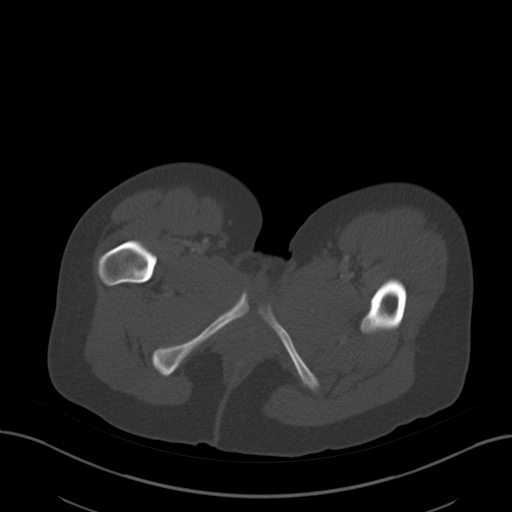
[im 11/89  soft-tissue]
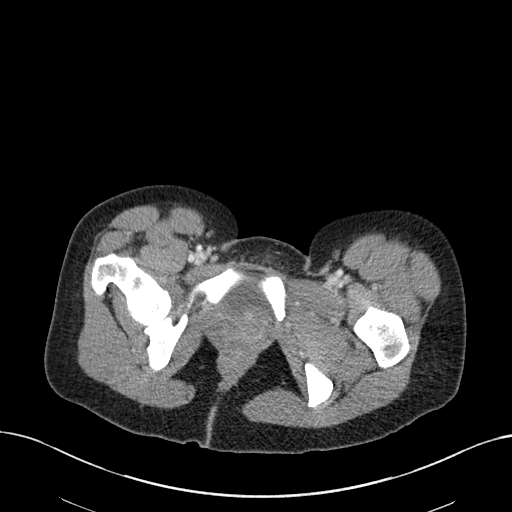
[im 21/89  soft-tissue]
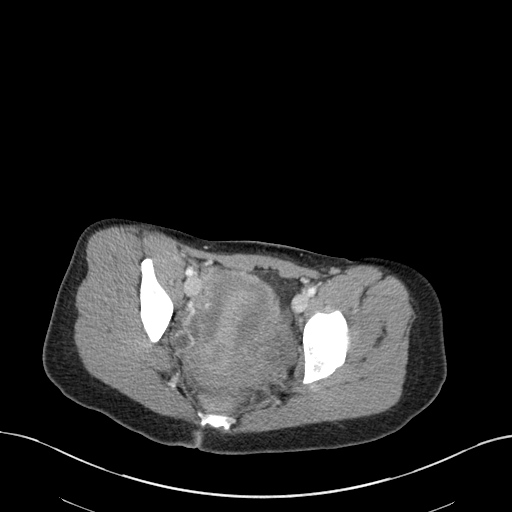
[im 26/89  soft-tissue]
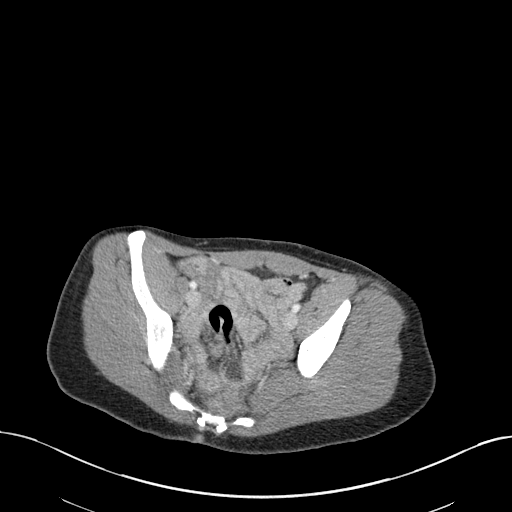
[im 32/89  soft-tissue]
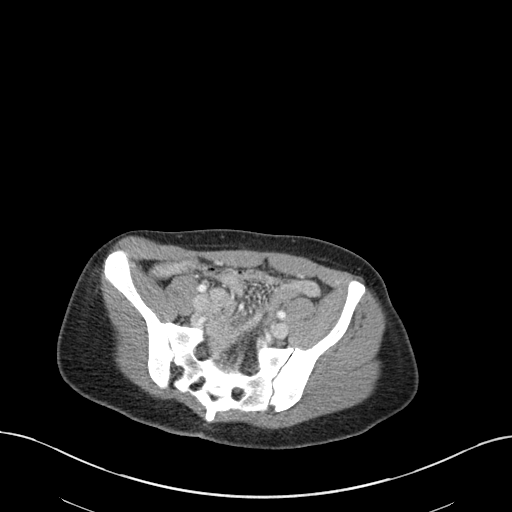
[im 37/89  soft-tissue]
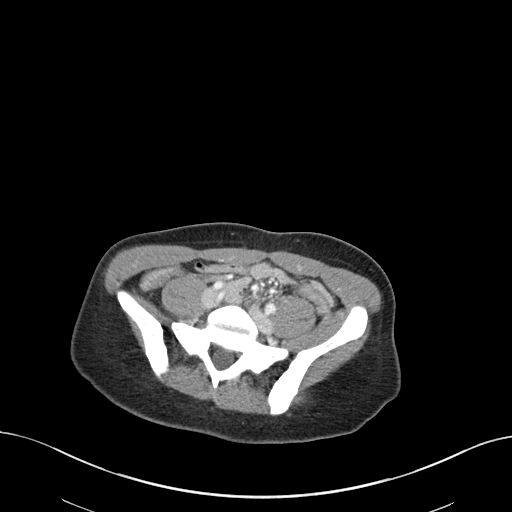
[im 47/89  soft-tissue]
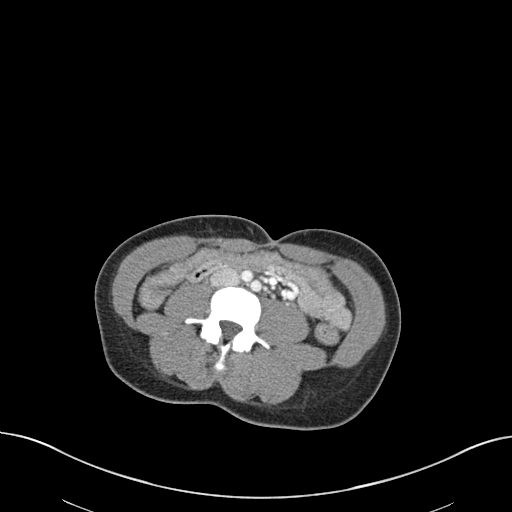
[im 52/89  soft-tissue]
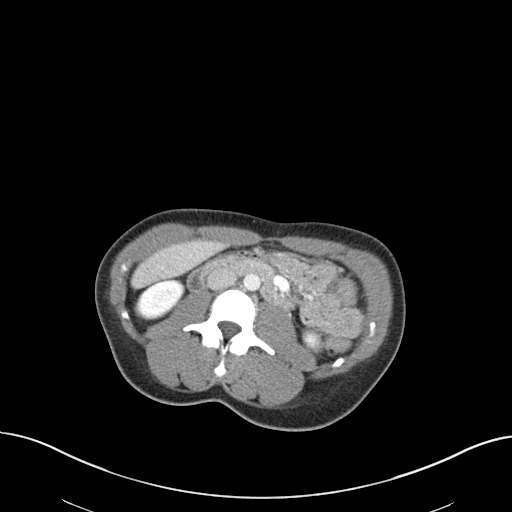
[im 57/89  soft-tissue]
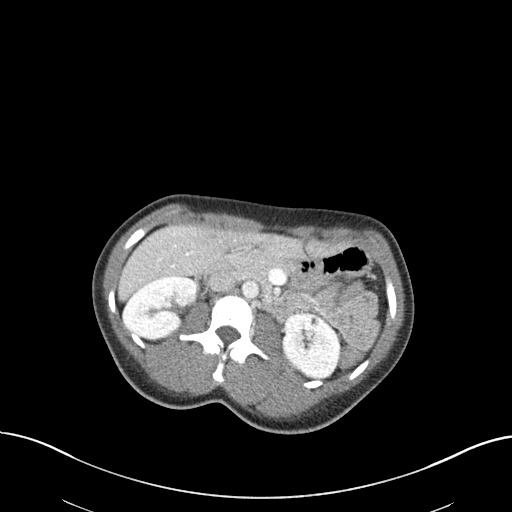
[im 57/89  bone]
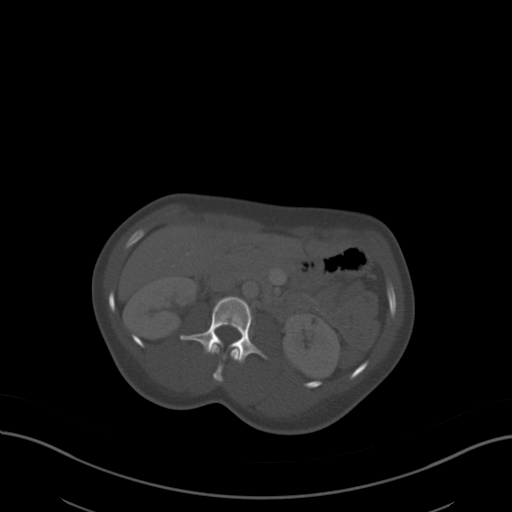
[im 63/89  soft-tissue]
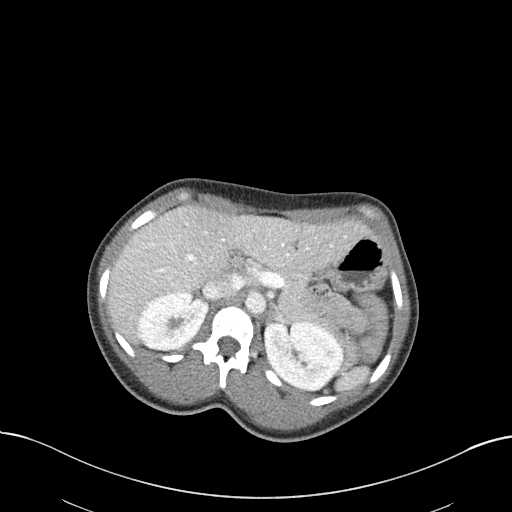
[im 68/89  soft-tissue]
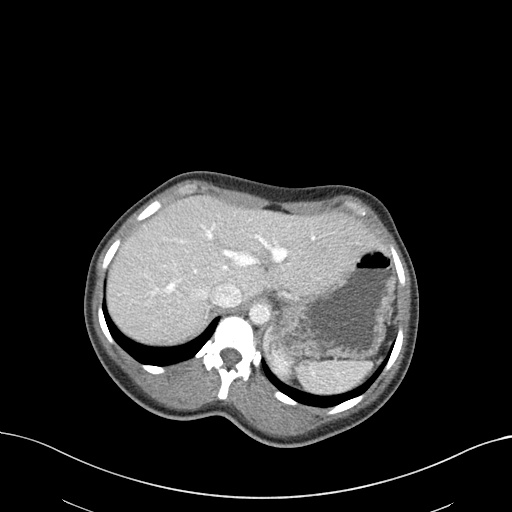
[im 78/89  soft-tissue]
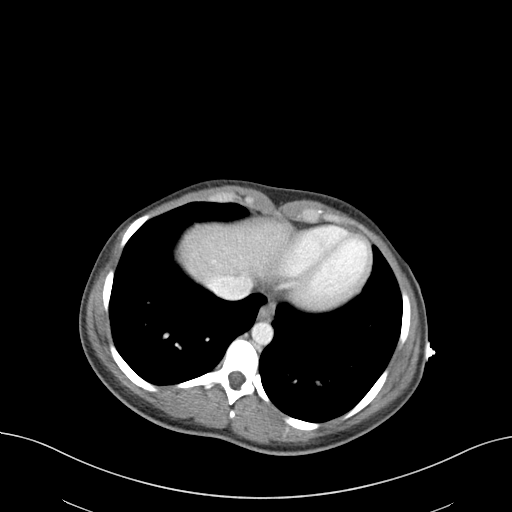
[im 83/89  soft-tissue]
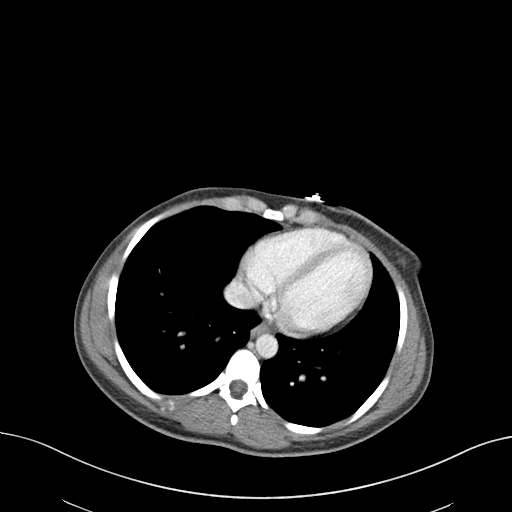

[Series 6: coronal st · coronal · 0.72mm/px · 3 of 92 slices shown]
[im 31/92  soft-tissue]
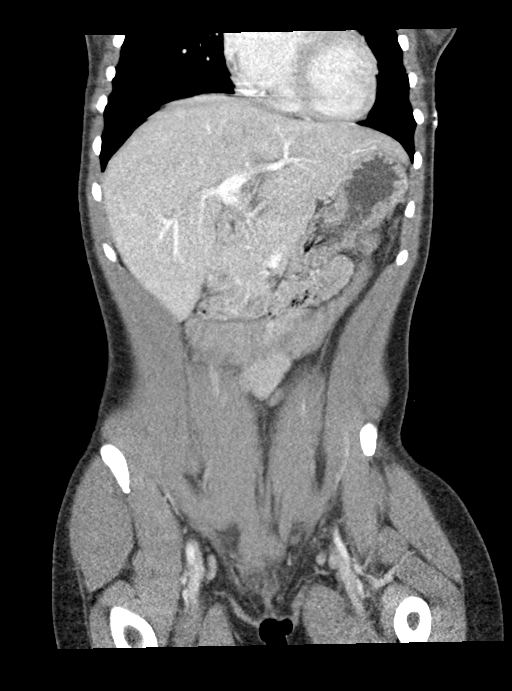
[im 41/92  soft-tissue]
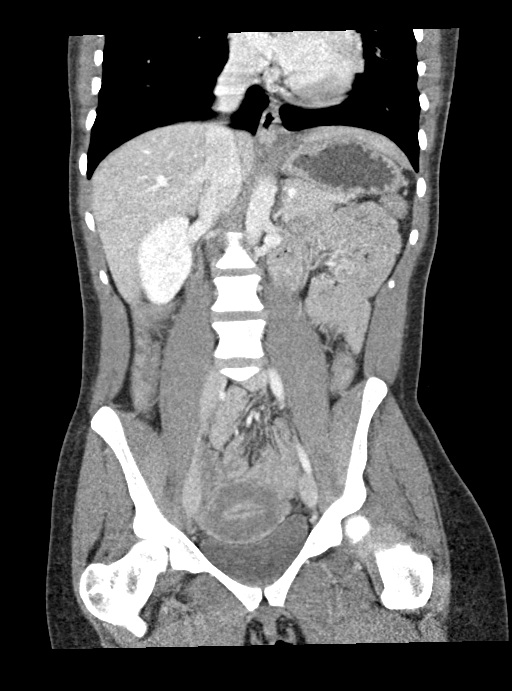
[im 51/92  soft-tissue]
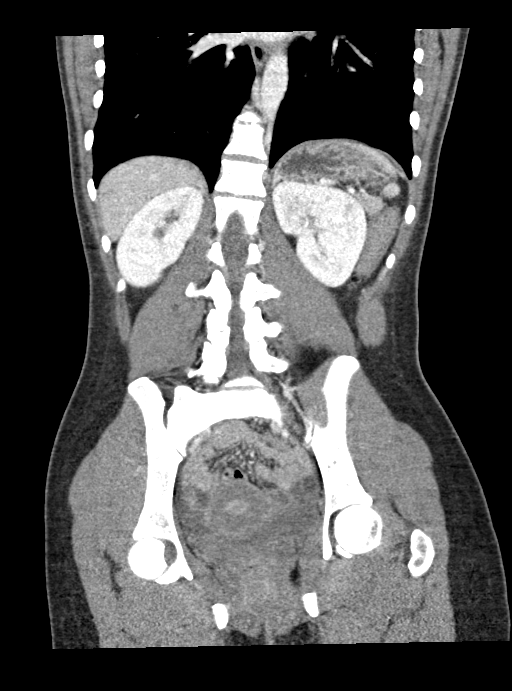

[16 of 46 positions shown; findings below may reference images not displayed]

FINDINGS: Lower chest: Lung bases clear

Hepatobiliary: Gallbladder contracted.  Liver normal appearance.

Pancreas: Normal appearance

Spleen: Normal appearance

Adrenals/Urinary Tract: Adrenal glands, kidneys, ureters, and
bladder normal appearance

Stomach/Bowel: Appendix not visualized but no pericecal inflammatory
process seen. Stomach and bowel loops otherwise normal appearance.

Vascular/Lymphatic: Vascular structures patent.  No adenopathy.

Reproductive: Unremarkable uterus and LEFT ovary. Ring enhancement
of a ruptured physiologic follicle in the RIGHT ovary.

Other: Tiny amount of nonspecific free fluid, potentially
physiologic. No free air. No hernia.

Musculoskeletal: Osseous structures unremarkable.
IMPRESSION: No intra-abdominal or intrapelvic abnormalities.

## 2021-06-11 ENCOUNTER — Other Ambulatory Visit (HOSPITAL_COMMUNITY): Payer: Self-pay

## 2021-06-13 ENCOUNTER — Other Ambulatory Visit (HOSPITAL_COMMUNITY): Payer: Self-pay

## 2021-06-17 ENCOUNTER — Other Ambulatory Visit (HOSPITAL_COMMUNITY): Payer: Self-pay

## 2021-07-02 ENCOUNTER — Other Ambulatory Visit (HOSPITAL_COMMUNITY): Payer: Self-pay

## 2021-07-07 ENCOUNTER — Other Ambulatory Visit (HOSPITAL_COMMUNITY): Payer: Self-pay

## 2021-07-21 ENCOUNTER — Telehealth: Payer: Self-pay

## 2021-07-21 ENCOUNTER — Ambulatory Visit: Payer: Medicaid Other | Admitting: Family

## 2021-07-21 NOTE — Telephone Encounter (Signed)
Spoke to patient regarding missed appointment today. Patient rescheduled to 07/22/21 at 9:30 AM.    Marcell Anger, CMA

## 2021-07-22 ENCOUNTER — Encounter: Payer: Self-pay | Admitting: Family

## 2021-07-22 ENCOUNTER — Ambulatory Visit: Payer: Medicaid Other | Admitting: Family

## 2021-07-22 ENCOUNTER — Other Ambulatory Visit: Payer: Self-pay

## 2021-07-22 ENCOUNTER — Other Ambulatory Visit (HOSPITAL_COMMUNITY): Payer: Self-pay

## 2021-07-22 ENCOUNTER — Ambulatory Visit (INDEPENDENT_AMBULATORY_CARE_PROVIDER_SITE_OTHER): Payer: Medicaid Other | Admitting: Family

## 2021-07-22 VITALS — BP 109/78 | HR 54 | Temp 98.2°F | Wt 139.0 lb

## 2021-07-22 DIAGNOSIS — Z21 Asymptomatic human immunodeficiency virus [HIV] infection status: Secondary | ICD-10-CM

## 2021-07-22 DIAGNOSIS — N926 Irregular menstruation, unspecified: Secondary | ICD-10-CM | POA: Insufficient documentation

## 2021-07-22 DIAGNOSIS — Z0000 Encounter for general adult medical examination without abnormal findings: Secondary | ICD-10-CM | POA: Insufficient documentation

## 2021-07-22 MED ORDER — BIKTARVY 50-200-25 MG PO TABS
1.0000 | ORAL_TABLET | Freq: Every day | ORAL | 4 refills | Status: DC
  Filled 2021-07-22 – 2021-07-28 (×2): qty 30, 30d supply, fill #0
  Filled 2021-08-22: qty 30, 30d supply, fill #1
  Filled 2021-09-19: qty 30, 30d supply, fill #2
  Filled 2021-10-15: qty 30, 30d supply, fill #3
  Filled 2021-11-05: qty 30, 30d supply, fill #4

## 2021-07-22 NOTE — Assessment & Plan Note (Signed)
Kristine Brown recently had a missed abortion and has concerns about absent menstrual cycle in the last 2 months with recent positive pregnancy test.  Recheck serum pregnancy levels.  Refer to obstetrics for ultrasound for confirmation of pregnancy if indeed present.  Recommended to start prenatal vitamin.

## 2021-07-22 NOTE — Assessment & Plan Note (Signed)
Discussed importance of safe sexual practices and condom use.  Condoms offered. ?

## 2021-07-22 NOTE — Patient Instructions (Signed)
Nice to see you.  We will check your lab work today.  Continue to take your medication daily as prescribed.  Refills have been sent to the pharmacy.  Plan for follow up in 3 months or sooner if needed with lab work on the same day.  Have a great day and stay safe!  

## 2021-07-22 NOTE — Assessment & Plan Note (Signed)
Ms. Vasallo continues to have well-controlled virus with good adherence and tolerance to her ART regimen of Biktarvy.  There is concern for a new pregnancy and will check pregnancy tests.  Discussed changing medication to Tivicay and Truvada and she wishes to know the results of the pregnancy test before changing medication.  Advised to start a prenatal vitamin.  Continue current dose of Biktarvy.  Check blood work today.  Plan for follow-up in 3 months or sooner if needed pending blood work results.

## 2021-07-22 NOTE — Progress Notes (Signed)
Brief Narrative   Patient ID: Kristine Brown, female    DOB: [birth year], 31 y.o.   MRN: [id number]  [name] is a 31 y/o AA female diagnosed with HIV in May of 2019 with risk factor of heterosexual contact. Initial CD4 count of 769 and viral load of 16,400. Genotype with no significant resistance patterns. Entered care at  [facility] Stage 1. [id number] negative. Sole medication regimen of Biktarvy.   Subjective:    Chief Complaint  Patient presents with   Follow-up    HPI:  Kristine Brown is a 31 y.o. female with HIV disease last seen on 04/08/21 with well-controlled virus with well-controlled virus and good adherence and tolerance to her ART regimen of Biktarvy.  She had a missed menstrual cycle and was found to be pregnant with recommendation for changing to dolutegravir and Truvada.  Subsequently seen on 05/12/2021 and found to have a missed abortion.  Here today for routine follow-up.  Kristine Brown continues to take her Biktarvy daily as prescribed.  She took the Cytotec that was prescribed by her obstetrician.  She has concerns that she missed her last 2 menstrual cycles and took a pregnancy test which has returned positive.  She is not currently on prenatal vitamins.  Overall feeling well today. Denies fevers, chills, night sweats, headaches, changes in vision, neck pain/stiffness, nausea, diarrhea, vomiting, lesions or rashes.  Kristine Brown has no problems obtaining medication from the pharmacy.  Denies feelings of being down, depressed, or hopeless recently.  Continues to use marijuana.  With no alcohol consumption and no tobacco use.  Condoms offered.  No Known Allergies    Outpatient Medications Prior to Visit  Medication Sig Dispense Refill   albuterol (VENTOLIN HFA) 108 (90 Base) MCG/ACT inhaler Inhale into the lungs.     escitalopram (LEXAPRO) 10 MG tablet Take 1/2 tablet by mouth daily. 30 tablet 4   ibuprofen (ADVIL) 600 MG tablet Take 1 tablet by mouth every 6 hours as  needed for mild pain, moderate pain or cramping. 30 tablet 0   metoCLOPramide (REGLAN) 10 MG tablet Take 1 tablet by mouth every 6 hours as needed for nausea. 20 tablet 0   Multiple Vitamins-Minerals (ONCOVITE) TABS Take 1 tablet by mouth daily.     ondansetron (ZOFRAN ODT) 8 MG disintegrating tablet Take 1 tablet (8 mg total) by mouth every 8 (eight) hours as needed for nausea or vomiting. 20 tablet 0   bictegravir-emtricitabine-tenofovir AF (BIKTARVY) 50-200-25 MG TABS tablet Take 1 tablet by mouth daily. 30 tablet 4   misoprostol (CYTOTEC) 200 MCG tablet Place 4 tablets vaginally once for 1 dose. (Take in 24 hours if no results from first dose given as inpatient) 4 tablet 0   oxyCODONE-acetaminophen (PERCOCET) 5-325 MG tablet Take 1 tablet by mouth every 4 hours as needed for severe pain. (Patient not taking: Reported on 07/22/2021) 6 tablet 0   No facility-administered medications prior to visit.     Past Medical History:  Diagnosis Date   Anxiety    Asthma    Depression    HIV (human immunodeficiency virus infection) ([place])      Past Surgical History:  Procedure Laterality Date   NO PAST SURGERIES        Review of Systems  Constitutional:  Negative for appetite change, chills, diaphoresis, fatigue, fever and unexpected weight change.  Eyes:        Negative for acute change in vision  Respiratory:  Negative for chest tightness, shortness of  breath and wheezing.   Cardiovascular:  Negative for chest pain.  Gastrointestinal:  Negative for diarrhea, nausea and vomiting.  Genitourinary:  Negative for dysuria, pelvic pain and vaginal discharge.  Musculoskeletal:  Negative for neck pain and neck stiffness.  Skin:  Negative for rash.  Neurological:  Negative for seizures, syncope, weakness and headaches.  Hematological:  Negative for adenopathy. Does not bruise/bleed easily.  Psychiatric/Behavioral:  Negative for hallucinations.      Objective:    BP 109/78    Pulse (!) 54     Temp 98.2 F (36.8 C) (Temporal)    Wt 139 lb (63 kg)    LMP 03/11/2021    BMI 25.42 kg/m  Nursing note and vital signs reviewed.  Physical Exam Constitutional:      General: She is not in acute distress.    Appearance: She is well-developed.  Eyes:     Conjunctiva/sclera: Conjunctivae normal.  Cardiovascular:     Rate and Rhythm: Normal rate and regular rhythm.     Heart sounds: Normal heart sounds. No murmur heard.   No friction rub. No gallop.  Pulmonary:     Effort: Pulmonary effort is normal. No respiratory distress.     Breath sounds: Normal breath sounds. No wheezing or rales.  Chest:     Chest wall: No tenderness.  Abdominal:     General: Bowel sounds are normal.     Palpations: Abdomen is soft.     Tenderness: There is no abdominal tenderness.  Musculoskeletal:     Cervical back: Neck supple.  Lymphadenopathy:     Cervical: No cervical adenopathy.  Skin:    General: Skin is warm and dry.     Findings: No rash.  Neurological:     Mental Status: She is alert and oriented to person, place, and time.  Psychiatric:        Behavior: Behavior normal.        Thought Content: Thought content normal.        Judgment: Judgment normal.     Depression screen [facility] 2/9 07/22/2021 03/05/2021  Decreased Interest 0 2  Down, Depressed, Hopeless 0 2  PHQ - 2 Score 0 4       Assessment & Plan:    Patient Active Problem List   Diagnosis Date Noted   Missed period 07/22/2021   Healthcare maintenance 07/22/2021   Vaginal discharge 04/08/2021   Depression 11/17/2019   Anxiety 05/12/2018   Asymptomatic HIV infection (HCC) 05/12/2018   Abdominal pain in female 04/13/2016   Acute non intractable tension-type headache 04/13/2016   Nausea 04/13/2016     Problem List Items Addressed This Visit       Other   Asymptomatic HIV infection (HCC) - Primary    [name] continues to have well-controlled virus with good adherence and tolerance to her ART regimen of Biktarvy.   There is concern for a new pregnancy and will check pregnancy tests.  Discussed changing medication to Tivicay and Truvada and she wishes to know the results of the pregnancy test before changing medication.  Advised to start a prenatal vitamin.  Continue current dose of Biktarvy.  Check blood work today.  Plan for follow-up in 3 months or sooner if needed pending blood work results.      Relevant Medications   bictegravir-emtricitabine-tenofovir AF (BIKTARVY) 50-200-25 MG TABS tablet   Other Relevant Orders   HIV-1 RNA quant-no reflex-bld   Comprehensive metabolic panel   T-helper cells (CD4) count (not at  [place])   Missed period    Ms. Kendal recently had a missed abortion and has concerns about absent menstrual cycle in the last 2 months with recent positive pregnancy test.  Recheck serum pregnancy levels.  Refer to obstetrics for ultrasound for confirmation of pregnancy if indeed present.  Recommended to start prenatal vitamin.      Relevant Orders   Ambulatory referral to Obstetrics / Gynecology   hCG, serum, qualitative   B-HCG [place] maintenance    Discussed importance of safe sexual practices and condom use.  Condoms offered.        I have discontinued Shoua Galeana's oxyCODONE-acetaminophen. I am also having her maintain her ondansetron, albuterol, Oncovite, escitalopram, misoprostol, ibuprofen, metoCLOPramide, and Biktarvy.   Meds ordered this encounter  Medications   bictegravir-emtricitabine-tenofovir AF (BIKTARVY) 50-200-25 MG TABS tablet    Sig: Take 1 tablet by mouth daily.    Dispense:  30 tablet    Refill:  4    Please mail to patient.    Order Specific Question:   Supervising Provider    Answer:   Carlyle Basques [[id number]]     Follow-up: Return in about 3 months (around 10/19/2021), or if symptoms worsen or fail to improve.   Terri Piedra, MSN, FNP-C Nurse Practitioner [facility] [place]  number: [phone]

## 2021-07-24 ENCOUNTER — Ambulatory Visit (INDEPENDENT_AMBULATORY_CARE_PROVIDER_SITE_OTHER): Payer: Medicaid Other

## 2021-07-24 ENCOUNTER — Other Ambulatory Visit: Payer: Self-pay

## 2021-07-24 VITALS — BP 120/74 | HR 60 | Ht 62.0 in | Wt 139.3 lb

## 2021-07-24 DIAGNOSIS — N939 Abnormal uterine and vaginal bleeding, unspecified: Secondary | ICD-10-CM

## 2021-07-24 DIAGNOSIS — Z3202 Encounter for pregnancy test, result negative: Secondary | ICD-10-CM | POA: Diagnosis not present

## 2021-07-24 LAB — COMPREHENSIVE METABOLIC PANEL
AG Ratio: 1.7 (calc) (ref 1.0–2.5)
ALT: 17 U/L (ref 6–29)
AST: 19 U/L (ref 10–30)
Albumin: 4.5 g/dL (ref 3.6–5.1)
Alkaline phosphatase (APISO): 41 U/L (ref 31–125)
BUN: 9 mg/dL (ref 7–25)
CO2: 28 mmol/L (ref 20–32)
Calcium: 9.4 mg/dL (ref 8.6–10.2)
Chloride: 104 mmol/L (ref 98–110)
Creat: 0.93 mg/dL (ref 0.50–0.97)
Globulin: 2.7 g/dL (calc) (ref 1.9–3.7)
Glucose, Bld: 84 mg/dL (ref 65–99)
Potassium: 4.1 mmol/L (ref 3.5–5.3)
Sodium: 138 mmol/L (ref 135–146)
Total Bilirubin: 0.3 mg/dL (ref 0.2–1.2)
Total Protein: 7.2 g/dL (ref 6.1–8.1)

## 2021-07-24 LAB — T-HELPER CELLS (CD4) COUNT (NOT AT ARMC)
Absolute CD4: 834 cells/uL (ref 490–1740)
CD4 T Helper %: 43 % (ref 30–61)
Total lymphocyte count: 1925 cells/uL (ref 850–3900)

## 2021-07-24 LAB — HCG, QUANTITATIVE, PREGNANCY: HCG, Total, QN: 20 m[IU]/mL

## 2021-07-24 LAB — BETA HCG QUANT (REF LAB): hCG Quant: 20 m[IU]/mL

## 2021-07-24 LAB — HIV-1 RNA QUANT-NO REFLEX-BLD
HIV 1 RNA Quant: NOT DETECTED Copies/mL
HIV-1 RNA Quant, Log: NOT DETECTED Log cps/mL

## 2021-07-24 LAB — POCT PREGNANCY, URINE: Preg Test, Ur: NEGATIVE

## 2021-07-24 NOTE — Progress Notes (Signed)
Pt dropped off urine today for UPT. UPT is negative.   Pt had SAB in Dec 2022. Pt unsure when started LMP after SAB and did not have follow up after last beta in Dec.  Pt had beta drawn at ID 2 days ago and beta results were 20. Pt states had 2 positive UPT at home.  Pt states having vaginal spotting with dime sized or smaller clots x 4 days with back pain. Pt has not taken any medication. Pt advised to take Tylenol. Pt agreeable.  Reviewed pt with Dr Shawnie Pons. Pt advised to have Stat beta today to see trend. Pt verbalized understanding.   Discussed with patient that we are following beta HCG levels today. Results will be back in approximately 3-4 hours. Valid contact number for patient confirmed. I will call the patient with results.   Beta HCG results: 05/12/2021 56,430  07/22/2021 20  07/24/2021 20   Results and patient history reviewed with Dr Vergie Living, who states uncertain of pregnancy vs false positive vs from SAB. Would like to repeat beta in 1 week. Patient called and informed of plan for follow-up. Pt verbalized understanding. Pt scheduled for lab on 3/6 at 310pm per pt's request. Pt agreeable to date and time of appt.   Isabell Jarvis 07/24/2021 11:51 AM   STAT beta  Judeth Cornfield, RN

## 2021-07-25 ENCOUNTER — Telehealth: Payer: Self-pay

## 2021-07-25 NOTE — Telephone Encounter (Signed)
Spoke with patient, relayed that viral load was undetectable and CD4 count over 800. Kidney and liver function tests and electrolytes are normal. Pregnancy test is negative, recommended she follow up with OB. Patient verbalized understanding and has no further questions.   Sandie Ano, RN

## 2021-07-25 NOTE — Telephone Encounter (Signed)
-----   Message from Veryl Speak, FNP sent at 07/25/2021  2:48 PM EST ----- Please inform Kristine Brown that her viral load is undetectable and CD4 count is 834. Kidney function, liver function and electrolytes are within normal ranges. Pregnancy test does not appear to be positive at this point, but recommend follow up with obstetrics.

## 2021-07-28 ENCOUNTER — Other Ambulatory Visit (HOSPITAL_COMMUNITY): Payer: Self-pay

## 2021-07-30 ENCOUNTER — Other Ambulatory Visit (HOSPITAL_COMMUNITY): Payer: Self-pay

## 2021-07-31 ENCOUNTER — Other Ambulatory Visit: Payer: Self-pay | Admitting: General Practice

## 2021-07-31 DIAGNOSIS — E349 Endocrine disorder, unspecified: Secondary | ICD-10-CM

## 2021-08-04 ENCOUNTER — Other Ambulatory Visit: Payer: Medicaid Other

## 2021-08-22 ENCOUNTER — Other Ambulatory Visit (HOSPITAL_COMMUNITY): Payer: Self-pay

## 2021-08-26 ENCOUNTER — Other Ambulatory Visit (HOSPITAL_COMMUNITY): Payer: Self-pay

## 2021-09-08 ENCOUNTER — Encounter: Payer: Self-pay | Admitting: Obstetrics and Gynecology

## 2021-09-08 ENCOUNTER — Ambulatory Visit (INDEPENDENT_AMBULATORY_CARE_PROVIDER_SITE_OTHER): Payer: Medicaid Other | Admitting: Obstetrics and Gynecology

## 2021-09-08 ENCOUNTER — Other Ambulatory Visit (HOSPITAL_COMMUNITY): Payer: Self-pay

## 2021-09-08 DIAGNOSIS — O039 Complete or unspecified spontaneous abortion without complication: Secondary | ICD-10-CM | POA: Diagnosis not present

## 2021-09-08 DIAGNOSIS — Z309 Encounter for contraceptive management, unspecified: Secondary | ICD-10-CM | POA: Insufficient documentation

## 2021-09-08 DIAGNOSIS — Z30011 Encounter for initial prescription of contraceptive pills: Secondary | ICD-10-CM

## 2021-09-08 MED ORDER — DESOGESTREL-ETHINYL ESTRADIOL 0.15-30 MG-MCG PO TABS
1.0000 | ORAL_TABLET | Freq: Every day | ORAL | 11 refills | Status: DC
  Filled 2021-09-08: qty 28, 28d supply, fill #0

## 2021-09-08 NOTE — Patient Instructions (Signed)

## 2021-09-08 NOTE — Progress Notes (Signed)
Kristine Brown presents for F/U from SAB. ?Last BHCG 2/23, 20. Reports currently on cycle ?Denies any bowel or bladder dysfunction. ?Sexual active and desires contraception. ?Requests OCP;s. Has used in the past without problems ? ?PE AF VSS ?Lungs clear Heart RRR ?Abd soft + BS ? ?A/P SAB ?       Contraception management. ? ?Will check BHCG today. Start OCP's today. U/R/B and back up method reviewed with pt. ?F/U in 3 months to gage response to OCP's and yearly gyn exam. ? ? ?

## 2021-09-09 LAB — BETA HCG QUANT (REF LAB): hCG Quant: <1 m[IU]/mL

## 2021-09-16 ENCOUNTER — Other Ambulatory Visit (HOSPITAL_COMMUNITY): Payer: Self-pay

## 2021-09-19 ENCOUNTER — Other Ambulatory Visit (HOSPITAL_COMMUNITY): Payer: Self-pay

## 2021-09-23 ENCOUNTER — Other Ambulatory Visit (HOSPITAL_COMMUNITY): Payer: Self-pay

## 2021-10-13 ENCOUNTER — Other Ambulatory Visit (HOSPITAL_COMMUNITY): Payer: Self-pay

## 2021-10-15 ENCOUNTER — Other Ambulatory Visit (HOSPITAL_COMMUNITY): Payer: Self-pay

## 2021-10-20 ENCOUNTER — Other Ambulatory Visit (HOSPITAL_COMMUNITY): Payer: Self-pay

## 2021-10-20 ENCOUNTER — Ambulatory Visit: Payer: Medicaid Other | Admitting: Family

## 2021-11-05 ENCOUNTER — Other Ambulatory Visit (HOSPITAL_COMMUNITY): Payer: Self-pay

## 2021-11-13 ENCOUNTER — Other Ambulatory Visit (HOSPITAL_COMMUNITY): Payer: Self-pay

## 2021-12-01 ENCOUNTER — Other Ambulatory Visit (HOSPITAL_COMMUNITY): Payer: Self-pay

## 2021-12-03 ENCOUNTER — Other Ambulatory Visit: Payer: Self-pay | Admitting: Family

## 2021-12-03 ENCOUNTER — Other Ambulatory Visit (HOSPITAL_COMMUNITY): Payer: Self-pay

## 2021-12-03 DIAGNOSIS — Z21 Asymptomatic human immunodeficiency virus [HIV] infection status: Secondary | ICD-10-CM

## 2021-12-03 MED ORDER — BIKTARVY 50-200-25 MG PO TABS
1.0000 | ORAL_TABLET | Freq: Every day | ORAL | 1 refills | Status: DC
  Filled 2021-12-03: qty 30, 30d supply, fill #0
  Filled 2022-01-05: qty 30, 30d supply, fill #1

## 2021-12-10 ENCOUNTER — Other Ambulatory Visit (HOSPITAL_COMMUNITY): Payer: Self-pay

## 2022-01-01 ENCOUNTER — Other Ambulatory Visit (HOSPITAL_COMMUNITY): Payer: Self-pay

## 2022-01-05 ENCOUNTER — Other Ambulatory Visit (HOSPITAL_COMMUNITY): Payer: Self-pay

## 2022-01-06 ENCOUNTER — Other Ambulatory Visit (HOSPITAL_COMMUNITY): Payer: Self-pay

## 2022-01-23 ENCOUNTER — Other Ambulatory Visit (HOSPITAL_COMMUNITY): Payer: Self-pay

## 2022-01-26 ENCOUNTER — Other Ambulatory Visit (HOSPITAL_COMMUNITY): Payer: Self-pay

## 2022-01-26 ENCOUNTER — Other Ambulatory Visit: Payer: Self-pay | Admitting: Family

## 2022-01-26 DIAGNOSIS — Z21 Asymptomatic human immunodeficiency virus [HIV] infection status: Secondary | ICD-10-CM

## 2022-01-26 NOTE — Telephone Encounter (Signed)
Pt has appt 01/27/22 - has enough medication until appt

## 2022-01-27 ENCOUNTER — Ambulatory Visit (INDEPENDENT_AMBULATORY_CARE_PROVIDER_SITE_OTHER): Payer: Medicaid Other | Admitting: Family

## 2022-01-27 ENCOUNTER — Other Ambulatory Visit: Payer: Self-pay

## 2022-01-27 ENCOUNTER — Other Ambulatory Visit (HOSPITAL_COMMUNITY): Payer: Self-pay

## 2022-01-27 ENCOUNTER — Encounter: Payer: Self-pay | Admitting: Family

## 2022-01-27 VITALS — BP 112/64 | HR 88 | Temp 98.2°F | Wt 140.8 lb

## 2022-01-27 DIAGNOSIS — Z0000 Encounter for general adult medical examination without abnormal findings: Secondary | ICD-10-CM | POA: Diagnosis not present

## 2022-01-27 DIAGNOSIS — Z21 Asymptomatic human immunodeficiency virus [HIV] infection status: Secondary | ICD-10-CM | POA: Diagnosis present

## 2022-01-27 DIAGNOSIS — F3342 Major depressive disorder, recurrent, in full remission: Secondary | ICD-10-CM

## 2022-01-27 MED ORDER — ONDANSETRON 8 MG PO TBDP
8.0000 mg | ORAL_TABLET | Freq: Three times a day (TID) | ORAL | 1 refills | Status: DC | PRN
  Filled 2022-01-27: qty 30, 10d supply, fill #0

## 2022-01-27 MED ORDER — BIKTARVY 50-200-25 MG PO TABS
1.0000 | ORAL_TABLET | Freq: Every day | ORAL | 5 refills | Status: DC
  Filled 2022-01-27: qty 30, 30d supply, fill #0

## 2022-01-27 NOTE — Assessment & Plan Note (Signed)
   Discussed importance of safe sexual practice, family planning and condom use. Condoms and STD testing offered.  Declines vaccination.  Routine dental care and cervical cancer screening up to date.

## 2022-01-27 NOTE — Assessment & Plan Note (Signed)
Mood is stable and not currently on medication. Continue to monitor.

## 2022-01-27 NOTE — Assessment & Plan Note (Signed)
Kristine Brown continues to have well controlled virus with good adherence and tolerance to Biktarvy. Does have occasional nausea and will continue ondansetron as needed. Reviewed previous lab work and discussed plan of care. Check lab work today. Continue current dose of Biktarvy. Plan for follow up in 4 months or sooner if needed with lab work on the same day.

## 2022-01-27 NOTE — Progress Notes (Signed)
Brief Narrative   Patient ID: Kristine Brown, female    DOB: 1990-10-31, 31 y.o.   MRN: [id number]  Kristine Brown is a 31 y/o AA female diagnosed with HIV in May of 2019 with risk factor of heterosexual contact. Initial CD4 count of 769 and viral load of 16,400. Genotype with no significant resistance patterns. Entered care at  [facility] Stage 1. [id number] negative. Sole medication regimen of Biktarvy.   Subjective:    Chief Complaint  Patient presents with   Follow-up    HPI:  Kristine Brown is a 31 y.o. female with HIV disease last seen on 07/22/21 with well controlled virus and good adherence and tolerance to Biktarvy. Viral load was undetectable with CD4 count 834. Kidney function, liver function, and electrolytes within normal ranges. Seen by Gynecology on 09/08/21 for contraception management. Here today for follow up.   Kristine Brown has been doing well since her last office visit and continues to take her medication as prescribed with no adverse side effects or problems obtaining it from the pharmacy. No longer taking her birth control. Having nausea on occasion and requesting refill of ondansetron take take as needed. Denies fevers, chills, night sweats, headaches, changes in vision, neck pain/stiffness, nausea, diarrhea, vomiting, lesions or rashes. Thinking of going back to school in the next couple of months. Condoms and STD testing offered. Healthcare maintenance due includes pneumococcal, meningococcal, and tetanus vaccinations. Routine dental care up to date.   No Known Allergies    Outpatient Medications Prior to Visit  Medication Sig Dispense Refill   albuterol (VENTOLIN HFA) 108 (90 Base) MCG/ACT inhaler Inhale into the lungs.     Multiple Vitamins-Minerals (ONCOVITE) TABS Take 1 tablet by mouth daily.     bictegravir-emtricitabine-tenofovir AF (BIKTARVY) 50-200-25 MG TABS tablet Take 1 tablet by mouth daily. 30 tablet 1   desogestrel-ethinyl estradiol (APRI) 0.15-30 MG-MCG  tablet Take 1 tablet by mouth daily. 28 tablet 11   ondansetron (ZOFRAN ODT) 8 MG disintegrating tablet Take 1 tablet (8 mg total) by mouth every 8 (eight) hours as needed for nausea or vomiting. 20 tablet 0   ibuprofen (ADVIL) 600 MG tablet Take 1 tablet by mouth every 6 hours as needed for mild pain, moderate pain or cramping. (Patient not taking: Reported on 01/27/2022) 30 tablet 0   escitalopram (LEXAPRO) 10 MG tablet Take 1/2 tablet by mouth daily. (Patient not taking: Reported on 01/27/2022) 30 tablet 4   metoCLOPramide (REGLAN) 10 MG tablet Take 1 tablet by mouth every 6 hours as needed for nausea. (Patient not taking: Reported on 01/27/2022) 20 tablet 0   No facility-administered medications prior to visit.     Past Medical History:  Diagnosis Date   Anxiety    Asthma    Depression    HIV (human immunodeficiency virus infection) (HCC)      Past Surgical History:  Procedure Laterality Date   NO PAST SURGERIES        Review of Systems  Constitutional:  Negative for appetite change, chills, diaphoresis, fatigue, fever and unexpected weight change.  Eyes:        Negative for acute change in vision  Respiratory:  Negative for chest tightness, shortness of breath and wheezing.   Cardiovascular:  Negative for chest pain.  Gastrointestinal:  Negative for diarrhea, nausea and vomiting.  Genitourinary:  Negative for dysuria, pelvic pain and vaginal discharge.  Musculoskeletal:  Negative for neck pain and neck stiffness.  Skin:  Negative for rash.  Neurological:  Negative for seizures, syncope, weakness and headaches.  Hematological:  Negative for adenopathy. Does not bruise/bleed easily.  Psychiatric/Behavioral:  Negative for hallucinations.       Objective:    BP 112/64   Pulse 88   Temp 98.2 F (36.8 C) (Oral)   Wt 140 lb 12.8 oz (63.9 kg)   LMP 01/03/2022 (Approximate)   SpO2 99%   BMI 25.75 kg/m  Nursing note and vital signs reviewed.  Physical  Exam Constitutional:      General: She is not in acute distress.    Appearance: She is well-developed.  Eyes:     Conjunctiva/sclera: Conjunctivae normal.  Cardiovascular:     Rate and Rhythm: Normal rate and regular rhythm.     Heart sounds: Normal heart sounds. No murmur heard.    No friction rub. No gallop.  Pulmonary:     Effort: Pulmonary effort is normal. No respiratory distress.     Breath sounds: Normal breath sounds. No wheezing or rales.  Chest:     Chest wall: No tenderness.  Abdominal:     General: Bowel sounds are normal.     Palpations: Abdomen is soft.     Tenderness: There is no abdominal tenderness.  Musculoskeletal:     Cervical back: Neck supple.  Lymphadenopathy:     Cervical: No cervical adenopathy.  Skin:    General: Skin is warm and dry.     Findings: No rash.  Neurological:     Mental Status: She is alert and oriented to person, place, and time.  Psychiatric:        Behavior: Behavior normal.        Thought Content: Thought content normal.        Judgment: Judgment normal.         09/08/2021    3:19 PM 07/22/2021    9:24 AM 03/05/2021    4:06 PM  Depression screen PHQ 2/9  Decreased Interest 1 0 2  Down, Depressed, Hopeless 1 0 2  PHQ - 2 Score 2 0 4  Altered sleeping 1    Tired, decreased energy 1    Change in appetite 1    Feeling bad or failure about yourself  0    Trouble concentrating 1    Moving slowly or fidgety/restless 0    Suicidal thoughts 0    PHQ-9 Score 6         Assessment & Plan:    Patient Active Problem List   Diagnosis Date Noted   SAB (spontaneous abortion) 09/08/2021   Contraception management 09/08/2021   Healthcare maintenance 07/22/2021   Depression 11/17/2019   Anxiety 05/12/2018   Asymptomatic HIV infection (HCC) 05/12/2018   Abdominal pain in female 04/13/2016     Problem List Items Addressed This Visit       Other   Asymptomatic HIV infection (HCC) - Primary    Kristine Brown continues to have  well controlled virus with good adherence and tolerance to [employer]. Does have occasional nausea and will continue ondansetron as needed. Reviewed previous lab work and discussed plan of care. Check lab work today. Continue current dose of Biktarvy. Plan for follow up in 4 months or sooner if needed with lab work on the same day.       Relevant Medications   bictegravir-emtricitabine-tenofovir AF (BIKTARVY) 50-200-25 MG TABS tablet   Other Relevant Orders   Comprehensive metabolic panel   HIV-1 RNA quant-no reflex-bld   T-helper cell (CD4)- ([facility] clinic only)  hCG, serum, qualitative   Depression    Mood is stable and not currently on medication. Continue to monitor.       Healthcare maintenance    Discussed importance of safe sexual practice, family planning and condom use. Condoms and STD testing offered. Declines vaccination. Routine dental care and cervical cancer screening up to date.         I have discontinued Kristine Brown's escitalopram, metoCLOPramide, and desogestrel-ethinyl estradiol. I have also changed her ondansetron. Additionally, I am having her maintain her albuterol, Oncovite, ibuprofen, and Biktarvy.   Meds ordered this encounter  Medications   bictegravir-emtricitabine-tenofovir AF (BIKTARVY) 50-200-25 MG TABS tablet    Sig: Take 1 tablet by mouth daily.    Dispense:  30 tablet    Refill:  5    Order Specific Question:   Supervising Provider    Answer:   Drue Second, CYNTHIA [[id number]]   ondansetron (ZOFRAN-ODT) 8 MG disintegrating tablet    Sig: Take 1 tablet (8 mg total) by mouth every 8 (eight) hours as needed for nausea or vomiting.    Dispense:  30 tablet    Refill:  1    Order Specific Question:   Supervising Provider    Answer:   Judyann Munson [[id number]]     Follow-up: Return in about 4 months (around 05/29/2022), or if symptoms worsen or fail to improve.   Marcos Eke, MSN, FNP-C Nurse Practitioner [facility] Main number: [phone]

## 2022-01-27 NOTE — Patient Instructions (Signed)
Nice to see you.  We will check your lab work today.  Continue to take your medication daily as prescribed.  Refills have been sent to the pharmacy.  Plan for follow up in 4 months or sooner if needed with lab work on the same day.  Have a great day and stay safe!  

## 2022-01-28 ENCOUNTER — Other Ambulatory Visit (HOSPITAL_COMMUNITY): Payer: Self-pay

## 2022-01-28 LAB — T-HELPER CELL (CD4) - (RCID CLINIC ONLY)
CD4 % Helper T Cell: 47 % (ref 33–65)
CD4 T Cell Abs: 985 /uL (ref 400–1790)

## 2022-01-29 LAB — COMPREHENSIVE METABOLIC PANEL
AG Ratio: 1.5 (calc) (ref 1.0–2.5)
ALT: 10 U/L (ref 6–29)
AST: 15 U/L (ref 10–30)
Albumin: 4.2 g/dL (ref 3.6–5.1)
Alkaline phosphatase (APISO): 55 U/L (ref 31–125)
BUN/Creatinine Ratio: 11 (calc) (ref 6–22)
BUN: 11 mg/dL (ref 7–25)
CO2: 24 mmol/L (ref 20–32)
Calcium: 9.7 mg/dL (ref 8.6–10.2)
Chloride: 106 mmol/L (ref 98–110)
Creat: 1.01 mg/dL — ABNORMAL HIGH (ref 0.50–0.97)
Globulin: 2.8 g/dL (calc) (ref 1.9–3.7)
Glucose, Bld: 113 mg/dL — ABNORMAL HIGH (ref 65–99)
Potassium: 3.8 mmol/L (ref 3.5–5.3)
Sodium: 137 mmol/L (ref 135–146)
Total Bilirubin: 0.2 mg/dL (ref 0.2–1.2)
Total Protein: 7 g/dL (ref 6.1–8.1)

## 2022-01-29 LAB — HIV-1 RNA QUANT-NO REFLEX-BLD
HIV 1 RNA Quant: NOT DETECTED Copies/mL
HIV-1 RNA Quant, Log: NOT DETECTED Log cps/mL

## 2022-01-29 LAB — HCG, SERUM, QUALITATIVE: Preg, Serum: NEGATIVE

## 2022-01-30 ENCOUNTER — Telehealth: Payer: Self-pay

## 2022-01-30 ENCOUNTER — Other Ambulatory Visit (HOSPITAL_COMMUNITY): Payer: Self-pay

## 2022-01-30 NOTE — Telephone Encounter (Signed)
I attempted to contact the patient to relay lab results. Patient did not answer and vm full.  Kristine Brown

## 2022-01-30 NOTE — Telephone Encounter (Signed)
Patient returned call, relayed that her viral load is undetectable and CD4 count is excellent at 985. Relayed that kidney and liver function and electrolytes were normal and pregnancy test negative. Patient verbalized understanding and has no further questions.   Sandie Ano, RN

## 2022-01-30 NOTE — Telephone Encounter (Signed)
-----   Message from Veryl Speak, FNP sent at 01/30/2022 11:21 AM EDT ----- Please inform Kristine Brown that her lab work looks good with undetectable viral load and CD4 count 985. Kidney function, liver function and electrolytes are normal and her pregnancy test was negative.

## 2022-01-30 NOTE — Progress Notes (Signed)
I attempted to contact patient to relay lab results. Patient did not answer and voicemail full Kristine Brown Kristine Brown

## 2022-02-18 ENCOUNTER — Other Ambulatory Visit (HOSPITAL_COMMUNITY): Payer: Self-pay

## 2022-02-20 ENCOUNTER — Other Ambulatory Visit (HOSPITAL_COMMUNITY): Payer: Self-pay

## 2022-02-23 ENCOUNTER — Other Ambulatory Visit (HOSPITAL_COMMUNITY): Payer: Self-pay

## 2022-04-06 ENCOUNTER — Other Ambulatory Visit (HOSPITAL_COMMUNITY): Payer: Self-pay

## 2022-04-06 ENCOUNTER — Telehealth: Payer: Self-pay

## 2022-04-06 MED ORDER — TIVICAY 50 MG PO TABS
50.0000 mg | ORAL_TABLET | Freq: Every day | ORAL | 5 refills | Status: DC
Start: 2022-04-06 — End: 2022-09-10
  Filled 2022-04-06 (×2): qty 30, 30d supply, fill #0
  Filled 2022-04-24: qty 30, 30d supply, fill #1
  Filled 2022-05-19: qty 30, 30d supply, fill #2
  Filled 2022-06-17: qty 30, 30d supply, fill #3
  Filled 2022-07-10: qty 30, 30d supply, fill #4
  Filled 2022-08-21: qty 30, 30d supply, fill #5

## 2022-04-06 MED ORDER — DESCOVY 200-25 MG PO TABS
1.0000 | ORAL_TABLET | Freq: Every day | ORAL | 5 refills | Status: DC
  Filled 2022-04-06 (×2): qty 30, 30d supply, fill #0
  Filled 2022-04-24: qty 30, 30d supply, fill #1
  Filled 2022-05-19: qty 30, 30d supply, fill #2
  Filled 2022-06-17: qty 30, 30d supply, fill #3
  Filled 2022-07-10: qty 30, 30d supply, fill #4
  Filled 2022-08-21: qty 30, 30d supply, fill #5

## 2022-04-06 NOTE — Telephone Encounter (Signed)
Patient is aware of medication changes and recommendation and follow up with OB.

## 2022-04-06 NOTE — Telephone Encounter (Signed)
Received message from [facility] front desk staff who spoke with patient. Stated the patient reported a positive pregnancy test, and requested to change her antiviral regimen.  Routed to provider. Please advise.  Binnie Kand, RN

## 2022-04-06 NOTE — Telephone Encounter (Signed)
Called patient to relay message from provider. No answer. Left HIPAA-compliant message at patient's home phone number requesting call back. Unable to leave a message at cell phone number (voicemail full).  Binnie Kand, RN

## 2022-04-06 NOTE — Addendum Note (Signed)
Addended by: Mauricio Po D on: 04/06/2022 11:22 AM   Modules accepted: Orders

## 2022-04-06 NOTE — Telephone Encounter (Signed)
Prescription for Tivicay and Descovy sent to pharmacy. Can stop taking Biktarvy now and start other medications. Please ensure she is taking a prenatal vitamin and has follow up with OB.

## 2022-04-07 ENCOUNTER — Encounter (HOSPITAL_COMMUNITY): Payer: Self-pay

## 2022-04-07 ENCOUNTER — Inpatient Hospital Stay (HOSPITAL_COMMUNITY)
Admission: AD | Admit: 2022-04-07 | Discharge: 2022-04-07 | Disposition: A | Discharge status: Home / Self Care | Payer: Medicaid Other | Attending: Obstetrics and Gynecology | Admitting: Obstetrics and Gynecology

## 2022-04-07 ENCOUNTER — Other Ambulatory Visit (HOSPITAL_COMMUNITY): Payer: Self-pay

## 2022-04-07 ENCOUNTER — Other Ambulatory Visit: Payer: Self-pay

## 2022-04-07 DIAGNOSIS — Z21 Asymptomatic human immunodeficiency virus [HIV] infection status: Secondary | ICD-10-CM | POA: Insufficient documentation

## 2022-04-07 DIAGNOSIS — O211 Hyperemesis gravidarum with metabolic disturbance: Secondary | ICD-10-CM | POA: Insufficient documentation

## 2022-04-07 DIAGNOSIS — E876 Hypokalemia: Secondary | ICD-10-CM

## 2022-04-07 DIAGNOSIS — R1084 Generalized abdominal pain: Secondary | ICD-10-CM | POA: Diagnosis not present

## 2022-04-07 DIAGNOSIS — Z87891 Personal history of nicotine dependence: Secondary | ICD-10-CM | POA: Diagnosis not present

## 2022-04-07 DIAGNOSIS — O26891 Other specified pregnancy related conditions, first trimester: Secondary | ICD-10-CM | POA: Diagnosis present

## 2022-04-07 DIAGNOSIS — F1290 Cannabis use, unspecified, uncomplicated: Secondary | ICD-10-CM | POA: Insufficient documentation

## 2022-04-07 DIAGNOSIS — Z3A01 Less than 8 weeks gestation of pregnancy: Secondary | ICD-10-CM | POA: Diagnosis not present

## 2022-04-07 DIAGNOSIS — O99891 Other specified diseases and conditions complicating pregnancy: Secondary | ICD-10-CM | POA: Diagnosis not present

## 2022-04-07 DIAGNOSIS — R197 Diarrhea, unspecified: Secondary | ICD-10-CM | POA: Diagnosis not present

## 2022-04-07 DIAGNOSIS — R112 Nausea with vomiting, unspecified: Secondary | ICD-10-CM

## 2022-04-07 DIAGNOSIS — O99321 Drug use complicating pregnancy, first trimester: Secondary | ICD-10-CM | POA: Diagnosis not present

## 2022-04-07 LAB — COMPREHENSIVE METABOLIC PANEL
ALT: 14 U/L (ref 0–44)
AST: 21 U/L (ref 15–41)
Albumin: 4 g/dL (ref 3.5–5.0)
Alkaline Phosphatase: 41 U/L (ref 38–126)
Anion gap: 12 (ref 5–15)
BUN: 5 mg/dL — ABNORMAL LOW (ref 6–20)
CO2: 20 mmol/L — ABNORMAL LOW (ref 22–32)
Calcium: 9.8 mg/dL (ref 8.9–10.3)
Chloride: 104 mmol/L (ref 98–111)
Creatinine, Ser: 0.82 mg/dL (ref 0.44–1.00)
GFR, Estimated: >60 mL/min (ref >60)
Glucose, Bld: 165 mg/dL — ABNORMAL HIGH (ref 70–99)
Potassium: 3 mmol/L — ABNORMAL LOW (ref 3.5–5.1)
Sodium: 136 mmol/L (ref 135–145)
Total Bilirubin: 0.3 mg/dL (ref 0.3–1.2)
Total Protein: 7.6 g/dL (ref 6.5–8.1)

## 2022-04-07 LAB — CBC WITH DIFFERENTIAL/PLATELET
Abs Immature Granulocytes: 0.04 10*3/uL (ref 0.00–0.07)
Basophils Absolute: 0 10*3/uL (ref 0.0–0.1)
Basophils Relative: 0 %
Eosinophils Absolute: 0 10*3/uL (ref 0.0–0.5)
Eosinophils Relative: 0 %
HCT: 33.7 % — ABNORMAL LOW (ref 36.0–46.0)
Hemoglobin: 11.9 g/dL — ABNORMAL LOW (ref 12.0–15.0)
Immature Granulocytes: 0 %
Lymphocytes Relative: 31 %
Lymphs Abs: 2.8 10*3/uL (ref 0.7–4.0)
MCH: 31.6 pg (ref 26.0–34.0)
MCHC: 35.3 g/dL (ref 30.0–36.0)
MCV: 89.4 fL (ref 80.0–100.0)
Monocytes Absolute: 1.1 10*3/uL — ABNORMAL HIGH (ref 0.1–1.0)
Monocytes Relative: 13 %
Neutro Abs: 4.9 10*3/uL (ref 1.7–7.7)
Neutrophils Relative %: 56 %
Platelets: 322 10*3/uL (ref 150–400)
RBC: 3.77 MIL/uL — ABNORMAL LOW (ref 3.87–5.11)
RDW: 13.2 % (ref 11.5–15.5)
WBC: 8.9 10*3/uL (ref 4.0–10.5)
nRBC: 0 % (ref 0.0–0.2)

## 2022-04-07 LAB — LIPASE, BLOOD: Lipase: 33 U/L (ref 11–51)

## 2022-04-07 LAB — POC URINE PREG, ED: Preg Test, Ur: POSITIVE — AB

## 2022-04-07 LAB — HCG, QUANTITATIVE, PREGNANCY: hCG, Beta Chain, Quant, S: 80590 m[IU]/mL — ABNORMAL HIGH (ref <5)

## 2022-04-07 MED ORDER — ONDANSETRON 4 MG PO TBDP
4.0000 mg | ORAL_TABLET | Freq: Once | ORAL | Status: AC
  Administered 2022-04-07: 4 mg via ORAL
  Filled 2022-04-07: qty 1

## 2022-04-07 MED ORDER — HYDROXYZINE PAMOATE 25 MG PO CAPS
25.0000 mg | ORAL_CAPSULE | Freq: Three times a day (TID) | ORAL | 0 refills | Status: DC | PRN
  Filled 2022-04-07: qty 30, 10d supply, fill #0

## 2022-04-07 MED ORDER — ONDANSETRON HCL 4 MG/2ML IJ SOLN
4.0000 mg | Freq: Three times a day (TID) | INTRAMUSCULAR | Status: DC
  Administered 2022-04-07: 4 mg via INTRAVENOUS
  Filled 2022-04-07: qty 2

## 2022-04-07 MED ORDER — ACETAMINOPHEN 160 MG/5ML PO SOLN: 650.0000 mg | Freq: Four times a day (QID) | ORAL | 0 refills | Status: DC | PRN

## 2022-04-07 MED ORDER — LACTATED RINGERS IV SOLN: INTRAVENOUS | Status: DC

## 2022-04-07 MED ORDER — HALOPERIDOL LACTATE 5 MG/ML IJ SOLN
2.0000 mg | Freq: Four times a day (QID) | INTRAMUSCULAR | Status: DC | PRN
  Administered 2022-04-07: 2 mg via INTRAVENOUS
  Filled 2022-04-07 (×2): qty 0.4

## 2022-04-07 MED ORDER — ACETAMINOPHEN 500 MG PO TABS
1000.0000 mg | ORAL_TABLET | Freq: Four times a day (QID) | ORAL | 2 refills | Status: AC | PRN
  Filled 2022-04-07: qty 100, 13d supply, fill #0

## 2022-04-07 MED ORDER — POTASSIUM CHLORIDE CRYS ER 10 MEQ PO TBCR
10.0000 meq | EXTENDED_RELEASE_TABLET | Freq: Two times a day (BID) | ORAL | 0 refills | Status: DC
  Filled 2022-04-07: qty 6, 3d supply, fill #0

## 2022-04-07 MED ORDER — POTASSIUM CHLORIDE 10 MEQ/100ML IV SOLN
10.0000 meq | INTRAVENOUS | Status: DC
  Administered 2022-04-07: 10 meq via INTRAVENOUS
  Filled 2022-04-07 (×4): qty 100

## 2022-04-07 MED ORDER — POTASSIUM CHLORIDE CRYS ER 20 MEQ PO TBCR
20.0000 meq | EXTENDED_RELEASE_TABLET | Freq: Once | ORAL | Status: AC
  Administered 2022-04-07: 20 meq via ORAL
  Filled 2022-04-07: qty 1

## 2022-04-07 MED ORDER — HALOPERIDOL LACTATE 5 MG/ML IJ SOLN
2.0000 mg | Freq: Once | INTRAMUSCULAR | Status: AC
  Administered 2022-04-07: 2 mg via INTRAVENOUS
  Filled 2022-04-07: qty 0.4

## 2022-04-07 MED ORDER — PROMETHAZINE HCL 12.5 MG PO TABS
12.5000 mg | ORAL_TABLET | Freq: Four times a day (QID) | ORAL | 0 refills | Status: DC | PRN
  Filled 2022-04-07: qty 30, 8d supply, fill #0

## 2022-04-07 MED ORDER — SCOPOLAMINE 1 MG/3DAYS TD PT72
1.0000 | MEDICATED_PATCH | TRANSDERMAL | 0 refills | Status: DC
  Filled 2022-04-07: qty 10, 30d supply, fill #0

## 2022-04-07 MED ORDER — LACTATED RINGERS IV BOLUS
1000.0000 mL | Freq: Once | INTRAVENOUS | Status: AC
  Administered 2022-04-07: 1000 mL via INTRAVENOUS

## 2022-04-07 MED ORDER — ACETAMINOPHEN 160 MG/5ML PO SOLN
650.0000 mg | Freq: Four times a day (QID) | ORAL | Status: DC | PRN
  Administered 2022-04-07: 650 mg via ORAL
  Filled 2022-04-07: qty 20.3

## 2022-04-07 MED ORDER — SCOPOLAMINE 1 MG/3DAYS TD PT72
1.0000 | MEDICATED_PATCH | TRANSDERMAL | Status: DC
  Administered 2022-04-07: 1.5 mg via TRANSDERMAL
  Filled 2022-04-07: qty 1

## 2022-04-07 MED ORDER — ONDANSETRON HCL 8 MG PO TABS
8.0000 mg | ORAL_TABLET | Freq: Three times a day (TID) | ORAL | 0 refills | Status: DC | PRN
  Filled 2022-04-07: qty 20, 7d supply, fill #0

## 2022-04-07 MED ORDER — PROMETHAZINE HCL 25 MG PO TABS
25.0000 mg | ORAL_TABLET | Freq: Four times a day (QID) | ORAL | 1 refills | Status: DC | PRN
  Filled 2022-04-07: qty 30, 8d supply, fill #0

## 2022-04-07 NOTE — MAU Provider Note (Addendum)
History    CSN: [id number]  Arrival date and time: 04/07/22 5498    Chief Complaint  Patient presents with   Abdominal Pain   Emesis   Nausea   Diarrhea   Kristine Brown is a 31 y.o. female [phone] @ [redacted]w[redacted]d here with nausea and vomiting. She was transferred from the main ED. Patient reports that she has been having nausea and vomiting for three days. She says that she stopped smoking marijuana three days ago when she found out she was pregnant and since then has been having "morning sickness". She endorses diffuse abdominal pain. She reports diarrhea starting today. She denies fever, chills, URI symptoms, or sick contacts.  Last week she reports spending a lot of time in hot showers because that was all that helped her symptoms.   Abdominal Pain Associated symptoms include diarrhea and vomiting.  Emesis  Associated symptoms include abdominal pain and diarrhea.  Diarrhea  Associated symptoms include abdominal pain and vomiting.    OB History     Gravida  5   Para  2   Term  2   Preterm  0   AB  1   Living  2      SAB      IAB      Ectopic      Multiple      Live Births  2           Past Medical History:  Diagnosis Date   Anxiety    Asthma    Depression    HIV (human immunodeficiency virus infection) (HCC)     Past Surgical History:  Procedure Laterality Date   NO PAST SURGERIES      Family History  Problem Relation Age of Onset   Healthy Mother    Cancer Father    COPD Father    Healthy Daughter    Healthy Son     Social History   Tobacco Use   Smoking status: Former    Types: Cigarettes   Smokeless tobacco: Never  Building services engineer Use: Never used  Substance Use Topics   Alcohol use: Never   Drug use: Yes    Types: Marijuana    Allergies: No Known Allergies  Medications Prior to Admission  Medication Sig Dispense Refill Last Dose   dolutegravir (TIVICAY) 50 MG tablet Take 1 tablet (50 mg total) by mouth daily. 30  tablet 5 04/06/2022   emtricitabine-tenofovir AF (DESCOVY) 200-25 MG tablet Take 1 tablet by mouth daily. 30 tablet 5 04/06/2022   ondansetron (ZOFRAN-ODT) 8 MG disintegrating tablet Take 1 tablet (8 mg total) by mouth every 8 (eight) hours as needed for nausea or vomiting. 30 tablet 1 04/07/2022   albuterol (VENTOLIN HFA) 108 (90 Base) MCG/ACT inhaler Inhale into the lungs.      ibuprofen (ADVIL) 600 MG tablet Take 1 tablet by mouth every 6 hours as needed for mild pain, moderate pain or cramping. (Patient not taking: Reported on 01/27/2022) 30 tablet 0    Multiple Vitamins-Minerals (ONCOVITE) TABS Take 1 tablet by mouth daily.      Results for orders placed or performed during the hospital encounter of 04/07/22 (from the past 48 hour(s))  POC Urine Pregnancy, ED (not at [facility])     Status: Abnormal   Collection Time: 04/07/22  7:25 AM  Result Value Ref Range   Preg Test, Ur POSITIVE (A) NEGATIVE    Comment:        THE  SENSITIVITY OF THIS METHODOLOGY IS >24 mIU/mL   CBC with Differential     Status: Abnormal   Collection Time: 04/07/22  7:30 AM  Result Value Ref Range   WBC 8.9 4.0 - 10.5 K/uL   RBC 3.77 (L) 3.87 - 5.11 MIL/uL   Hemoglobin 11.9 (L) 12.0 - 15.0 g/dL   HCT 33.7 (L) 36.0 - 46.0 %   MCV 89.4 80.0 - 100.0 fL   MCH 31.6 26.0 - 34.0 pg   MCHC 35.3 30.0 - 36.0 g/dL   RDW 13.2 11.5 - 15.5 %   Platelets 322 150 - 400 K/uL   nRBC 0.0 0.0 - 0.2 %   Neutrophils Relative % 56 %   Neutro Abs 4.9 1.7 - 7.7 K/uL   Lymphocytes Relative 31 %   Lymphs Abs 2.8 0.7 - 4.0 K/uL   Monocytes Relative 13 %   Monocytes Absolute 1.1 (H) 0.1 - 1.0 K/uL   Eosinophils Relative 0 %   Eosinophils Absolute 0.0 0.0 - 0.5 K/uL   Basophils Relative 0 %   Basophils Absolute 0.0 0.0 - 0.1 K/uL   Immature Granulocytes 0 %   Abs Immature Granulocytes 0.04 0.00 - 0.07 K/uL    Comment: Performed at [facility], [address], [place], [place] [address]  Comprehensive metabolic panel     Status:  Abnormal   Collection Time: 04/07/22  7:30 AM  Result Value Ref Range   Sodium 136 135 - 145 mmol/L   Potassium 3.0 (L) 3.5 - 5.1 mmol/L   Chloride 104 98 - 111 mmol/L   CO2 20 (L) 22 - 32 mmol/L   Glucose, Bld 165 (H) 70 - 99 mg/dL    Comment: Glucose reference range applies only to samples taken after fasting for at least 8 hours.   BUN 5 (L) 6 - 20 mg/dL   Creatinine, Ser 0.82 0.44 - 1.00 mg/dL   Calcium 9.8 8.9 - 10.3 mg/dL   Total Protein 7.6 6.5 - 8.1 g/dL   Albumin 4.0 3.5 - 5.0 g/dL   AST 21 15 - 41 U/L   ALT 14 0 - 44 U/L   Alkaline Phosphatase 41 38 - 126 U/L   Total Bilirubin 0.3 0.3 - 1.2 mg/dL   GFR, Estimated >60 >60 mL/min    Comment: (NOTE) Calculated using the CKD-EPI Creatinine Equation (2021)    Anion gap 12 5 - 15    Comment: Performed at [facility] [address], [place], [place] [address]  Lipase, blood     Status: None   Collection Time: 04/07/22  7:30 AM  Result Value Ref Range   Lipase 33 11 - 51 U/L    Comment: Performed at [facility] [address], [place], [place] [address]  hCG, quantitative, pregnancy     Status: Abnormal   Collection Time: 04/07/22  7:30 AM  Result Value Ref Range   hCG, Beta Chain, Quant, S 80,590 (H) <5 mIU/mL    Comment:          GEST. AGE      CONC.  (mIU/mL)   <=1 WEEK        5 - 50     2 WEEKS       50 - 500     3 WEEKS       100 - 10,000     4 WEEKS     1,000 - 30,000     5 WEEKS  3,500 - 115,000   6-8 WEEKS     12,000 - 270,000    12 WEEKS     15,000 - 220,000        FEMALE AND NON-PREGNANT FEMALE:     LESS THAN 5 mIU/mL Performed at [facility], [address] [place], [place] [address]      Review of Systems  Gastrointestinal:  Positive for abdominal pain, diarrhea and vomiting.   Physical Exam   Blood pressure 121/65, pulse (!) 59, temperature 97.7 F (36.5 C), temperature source Oral, resp. rate 20, last menstrual period 02/27/2022, SpO2 100 %, unknown if currently  breastfeeding.  Physical Exam Constitutional:      Appearance: She is ill-appearing. She is not toxic-appearing.  HENT:     Mouth/Throat:     Comments: Dry mucous membranes Cardiovascular:     Rate and Rhythm: Normal rate and regular rhythm.     Heart sounds: Normal heart sounds.  Pulmonary:     Effort: Pulmonary effort is normal.     Breath sounds: Normal breath sounds.  Abdominal:     General: Abdomen is flat. There is no distension.     Palpations: Abdomen is soft.     Tenderness: There is generalized abdominal tenderness. There is no guarding or rebound.  Skin:    General: Skin is warm and dry.     Capillary Refill: Capillary refill takes 2 to 3 seconds.  Neurological:     Mental Status: She is alert.  Psychiatric:        Mood and Affect: Mood is anxious.     MAU Course  Vitals signs stable. Patient is mildly hypotensive, well perfused.  CBC wnl  CMP, K 3.0, Cr wnl Lipase wnl  Treatments: 1L LR bolus, haldol 2 mg, zofran 4 mg, tylenol 650 mg, K 10 IV, 20 meq oral  US transvaginal ordered- patient left prior to completion. Sharene Butters is 80,000  Assessment and Plan  Emesis  Most likely cause of emesis is cannabinoid hyperemesis and withdrawal in the setting of stopping cannabis use 3 days prior. Rule out appendicitis, cholecystitis as abdominal pain is diffuse, patient is afebrile, CBC wnl.   SAB unlikely as there is no vaginal bleeding, diffuse abdominal pain. Will treat with haldol, zofran, tylenol. Ectopic workup ordered, however patient had to leave prior to Ultrasound being done.  Plan: send home with Zofran 8 mg prn, phenergan prn, scopolamine patch, vistaril, KDUR 10 meq tablets BID for 4 doses total.   Hypokalemia  Most likely due to loss of potassium from episodes of emesis. Will supplement with IV potassium and oral potassium on discharge.   Lockie Mola 04/07/2022, 8:58 AM    Attestation of Supervision of Student:  I confirm that I have verified the  information documented in the  Resident 's note,  and that I have also personally reperformed the history, physical exam and all medical decision making activities.  I have verified that all services and findings are accurately documented in this student's note; and I agree with management and plan as outlined in the documentation. I have also made any necessary editorial changes.  (add any additional history, PE or MDM you re-performed)  Venia Carbon, NP [facility] 04/07/2022 3:11 PM

## 2022-04-07 NOTE — MAU Note (Signed)
Pt reporting nausea improved, POC discussed. Call light in reach.

## 2022-04-07 NOTE — ED Provider Notes (Signed)
7:34 AM Spoke to Sam at MAU who agrees accept patient to MAU. Patient stable prior to transport.     Suzy Bouchard, PA-C 04/07/22 Julian, Elmer, DO 04/07/22 1218

## 2022-04-07 NOTE — MAU Note (Signed)
Kristine Brown is a 31 y.o. at [redacted]w[redacted]d here in MAU reporting: abdominal pain and vomiting. Vomiting started 3 days ago and pain has been going on for 1 day. No bleeding. Last marijuana use was "a few days ago".   LMP: 02/27/22  Onset of complaint: ongoing  Pain score: 10/10  Vitals:   04/07/22 0716 04/07/22 0830  BP: 106/62 121/65  Pulse: (!) 58 (!) 59  Resp: 18 20  Temp: 97.8 F (36.6 C) 97.7 F (36.5 C)  SpO2: 97% 100%     FHT:NA  Lab orders placed from triage: none

## 2022-04-07 NOTE — ED Triage Notes (Addendum)
EMS stated, she has stomach pain with N/V/D this started throughout the night  others in her household is sick.She said she was pregnant.

## 2022-04-07 NOTE — MAU Note (Signed)
NP recommending pt stay for u/s due to abdominal pain. Pt states she cannot stay for u/s right now and needs to leave.

## 2022-04-07 NOTE — ED Provider Triage Note (Signed)
Emergency Medicine Provider Triage Evaluation Note  Kristine Brown , a 31 y.o. female  was evaluated in triage.  Pt complains of nausea, vomiting, and diarrhea that started last night.  Child sick with similar symptoms.  History of HIV, currently on medication. Had a positive at home pregnancy test. Thinks she is roughly 1 month pregnant. G5P2. She admits to abdominal pain. No vaginal bleeding  Review of Systems  Positive: Abdominal pain, N/V/D Negative: fever  Physical Exam  There were no vitals taken for this visit. Gen:   Awake, no distress   Resp:  Normal effort  MSK:   Moves extremities without difficulty  Other:    Medical Decision Making  Medically screening exam initiated at 7:14 AM.  Appropriate orders placed.  Kristine Brown was informed that the remainder of the evaluation will be completed by another provider, this initial triage assessment does not replace that evaluation, and the importance of remaining in the ED until their evaluation is complete.  Abdominal labs ordered COVID/influenza Pregnancy test   Kristine Bouchard, PA-C 04/07/22 0725

## 2022-04-08 ENCOUNTER — Other Ambulatory Visit (HOSPITAL_COMMUNITY): Payer: Self-pay

## 2022-04-09 ENCOUNTER — Inpatient Hospital Stay (EMERGENCY_DEPARTMENT_HOSPITAL)
Admission: AD | Admit: 2022-04-09 | Discharge: 2022-04-10 | Disposition: A | Discharge status: Home / Self Care | Payer: Medicaid Other | Source: Home / Self Care | Attending: Family Medicine | Admitting: Family Medicine

## 2022-04-09 ENCOUNTER — Encounter (HOSPITAL_COMMUNITY): Payer: Self-pay | Admitting: *Deleted

## 2022-04-09 ENCOUNTER — Other Ambulatory Visit (HOSPITAL_COMMUNITY): Payer: Self-pay

## 2022-04-09 ENCOUNTER — Other Ambulatory Visit: Payer: Self-pay

## 2022-04-09 ENCOUNTER — Inpatient Hospital Stay (HOSPITAL_COMMUNITY)
Admission: AD | Admit: 2022-04-09 | Discharge: 2022-04-09 | Disposition: A | Discharge status: Home / Self Care | Payer: Medicaid Other | Attending: Obstetrics and Gynecology | Admitting: Obstetrics and Gynecology

## 2022-04-09 ENCOUNTER — Inpatient Hospital Stay (HOSPITAL_COMMUNITY): Payer: Medicaid Other

## 2022-04-09 DIAGNOSIS — R112 Nausea with vomiting, unspecified: Secondary | ICD-10-CM

## 2022-04-09 DIAGNOSIS — E860 Dehydration: Secondary | ICD-10-CM | POA: Diagnosis not present

## 2022-04-09 DIAGNOSIS — O99321 Drug use complicating pregnancy, first trimester: Secondary | ICD-10-CM | POA: Insufficient documentation

## 2022-04-09 DIAGNOSIS — Z87891 Personal history of nicotine dependence: Secondary | ICD-10-CM | POA: Insufficient documentation

## 2022-04-09 DIAGNOSIS — O26891 Other specified pregnancy related conditions, first trimester: Secondary | ICD-10-CM | POA: Diagnosis present

## 2022-04-09 DIAGNOSIS — Z3A01 Less than 8 weeks gestation of pregnancy: Secondary | ICD-10-CM | POA: Insufficient documentation

## 2022-04-09 DIAGNOSIS — O208 Other hemorrhage in early pregnancy: Secondary | ICD-10-CM | POA: Insufficient documentation

## 2022-04-09 DIAGNOSIS — O99341 Other mental disorders complicating pregnancy, first trimester: Secondary | ICD-10-CM | POA: Insufficient documentation

## 2022-04-09 DIAGNOSIS — O99511 Diseases of the respiratory system complicating pregnancy, first trimester: Secondary | ICD-10-CM | POA: Insufficient documentation

## 2022-04-09 DIAGNOSIS — R102 Pelvic and perineal pain: Secondary | ICD-10-CM | POA: Insufficient documentation

## 2022-04-09 DIAGNOSIS — Z3491 Encounter for supervision of normal pregnancy, unspecified, first trimester: Secondary | ICD-10-CM

## 2022-04-09 DIAGNOSIS — O219 Vomiting of pregnancy, unspecified: Secondary | ICD-10-CM

## 2022-04-09 LAB — URINALYSIS, ROUTINE W REFLEX MICROSCOPIC
Bacteria, UA: NONE SEEN
Bilirubin Urine: NEGATIVE
Glucose, UA: NEGATIVE mg/dL
Hgb urine dipstick: NEGATIVE
Ketones, ur: 80 mg/dL — AB
Leukocytes,Ua: NEGATIVE
Nitrite: NEGATIVE
Protein, ur: 30 mg/dL — AB
Specific Gravity, Urine: 1.031 — ABNORMAL HIGH (ref 1.005–1.030)
pH: 5 (ref 5.0–8.0)

## 2022-04-09 LAB — CBC WITH DIFFERENTIAL/PLATELET
Abs Immature Granulocytes: 0.02 10*3/uL (ref 0.00–0.07)
Basophils Absolute: 0 10*3/uL (ref 0.0–0.1)
Basophils Relative: 0 %
Eosinophils Absolute: 0 10*3/uL (ref 0.0–0.5)
Eosinophils Relative: 0 %
HCT: 33.7 % — ABNORMAL LOW (ref 36.0–46.0)
Hemoglobin: 11.6 g/dL — ABNORMAL LOW (ref 12.0–15.0)
Immature Granulocytes: 0 %
Lymphocytes Relative: 25 %
Lymphs Abs: 2.2 10*3/uL (ref 0.7–4.0)
MCH: 31.1 pg (ref 26.0–34.0)
MCHC: 34.4 g/dL (ref 30.0–36.0)
MCV: 90.3 fL (ref 80.0–100.0)
Monocytes Absolute: 0.9 10*3/uL (ref 0.1–1.0)
Monocytes Relative: 10 %
Neutro Abs: 5.8 10*3/uL (ref 1.7–7.7)
Neutrophils Relative %: 65 %
Platelets: 328 10*3/uL (ref 150–400)
RBC: 3.73 MIL/uL — ABNORMAL LOW (ref 3.87–5.11)
RDW: 12.7 % (ref 11.5–15.5)
WBC: 9 10*3/uL (ref 4.0–10.5)
nRBC: 0 % (ref 0.0–0.2)

## 2022-04-09 LAB — COMPREHENSIVE METABOLIC PANEL
ALT: 19 U/L (ref 0–44)
AST: 21 U/L (ref 15–41)
Albumin: 4.2 g/dL (ref 3.5–5.0)
Alkaline Phosphatase: 45 U/L (ref 38–126)
Anion gap: 9 (ref 5–15)
BUN: 6 mg/dL (ref 6–20)
CO2: 21 mmol/L — ABNORMAL LOW (ref 22–32)
Calcium: 9.7 mg/dL (ref 8.9–10.3)
Chloride: 102 mmol/L (ref 98–111)
Creatinine, Ser: 0.8 mg/dL (ref 0.44–1.00)
GFR, Estimated: >60 mL/min (ref >60)
Glucose, Bld: 109 mg/dL — ABNORMAL HIGH (ref 70–99)
Potassium: 3.5 mmol/L (ref 3.5–5.1)
Sodium: 132 mmol/L — ABNORMAL LOW (ref 135–145)
Total Bilirubin: 0.5 mg/dL (ref 0.3–1.2)
Total Protein: 7.4 g/dL (ref 6.5–8.1)

## 2022-04-09 MED ORDER — LACTATED RINGERS IV BOLUS
1000.0000 mL | Freq: Once | INTRAVENOUS | Status: AC
  Administered 2022-04-09: 1000 mL via INTRAVENOUS

## 2022-04-09 MED ORDER — METOCLOPRAMIDE HCL 5 MG/ML IJ SOLN
10.0000 mg | Freq: Once | INTRAMUSCULAR | Status: AC
  Administered 2022-04-09: 10 mg via INTRAVENOUS
  Filled 2022-04-09: qty 2

## 2022-04-09 MED ORDER — PROMETHAZINE HCL 25 MG RE SUPP
25.0000 mg | Freq: Four times a day (QID) | RECTAL | 0 refills | Status: DC | PRN
  Filled 2022-04-09: qty 12, 3d supply, fill #0

## 2022-04-09 MED ORDER — SCOPOLAMINE 1 MG/3DAYS TD PT72
1.0000 | MEDICATED_PATCH | TRANSDERMAL | Status: DC
  Administered 2022-04-09: 1.5 mg via TRANSDERMAL
  Filled 2022-04-09: qty 1

## 2022-04-09 NOTE — MAU Note (Signed)
Kristine Brown is a 31 y.o. at [redacted]w[redacted]d here in MAU reporting: everything has been coming, having vomiting and diarrhea since found out she was preg 6 days ago.  Having pains in her stomach every now and then. Can't keep anything down.  Was told the marijuana was contributing,hasn't in 6 days  Onset of complaint: on going.  Was quiet and sleeping in lobby, now pacing and crying. Pain score: 5 Vitals:   04/09/22 0904  BP: (!) 120/104  Pulse: 60  Resp: 16  Temp: 98.7 F (37.1 C)  SpO2: 99%     Lab orders placed from triage:  UA

## 2022-04-09 NOTE — ED Triage Notes (Signed)
Patient states she is 8 weeks preg. LMP 9/27G5 P2 A2 states she hasn't been able to keep any foods down in 6 days.

## 2022-04-09 NOTE — Discharge Instructions (Signed)
Safe Medications in Pregnancy    Acne: Benzoyl Peroxide Salicylic Acid  Backache/Headache: Tylenol: 2 regular strength every 4 hours OR              2 Extra strength every 6 hours  Colds/Coughs/Allergies: Benadryl (alcohol free) 25 mg every 6 hours as needed Breath right strips Claritin Cepacol throat lozenges Chloraseptic throat spray Cold-Eeze- up to three times per day Cough drops, alcohol free Flonase (by prescription only) Guaifenesin Mucinex Robitussin DM (plain only, alcohol free) Saline nasal spray/drops Sudafed (pseudoephedrine) & Actifed ** use only after [redacted] weeks gestation and if you do not have high blood pressure Tylenol Vicks Vaporub Zinc lozenges Zyrtec   Constipation: Colace Ducolax suppositories Fleet enema Glycerin suppositories Metamucil Milk of magnesia Miralax Senokot Smooth move tea  Diarrhea: Kaopectate Imodium A-D  *NO pepto Bismol  Hemorrhoids: Anusol Anusol HC Preparation H Tucks  Indigestion: Tums Maalox Mylanta Zantac  Pepcid  Insomnia: Benadryl (alcohol free) 25mg every 6 hours as needed Tylenol PM Unisom, no Gelcaps  Leg Cramps: Tums MagGel  Nausea/Vomiting:  Bonine Dramamine Emetrol Ginger extract Sea bands Meclizine  Nausea medication to take during pregnancy:  Unisom (doxylamine succinate 25 mg tablets) Take one tablet daily at bedtime. If symptoms are not adequately controlled, the dose can be increased to a maximum recommended dose of two tablets daily (1/2 tablet in the morning, 1/2 tablet mid-afternoon and one at bedtime). Vitamin B6 100mg tablets. Take one tablet twice a day (up to 200 mg per day).  Skin Rashes: Aveeno products Benadryl cream or 25mg every 6 hours as needed Calamine Lotion 1% cortisone cream  Yeast infection: Gyne-lotrimin 7 Monistat 7   **If taking multiple medications, please check labels to avoid duplicating the same active ingredients **take  medication as directed on the label ** Do not exceed 4000 mg of tylenol in 24 hours **Do not take medications that contain aspirin or ibuprofen   Prenatal Care Providers           [facility]  [address] [phone]  [facility]   [address]  [phone]  [facility] [place]       [address] [phone]            [facility]     [address] [phone]          [facility] [place]   [address] [phone]  [facility]  [address] [phone]     [facility])  [address]   [phone]     [facility]  Phone: [phone]  [facility]  Phone: [phone]  [facility] Phone: [phone]  [facility] Phone: [phone]  [facility] Phone: [phone]  [facility] Phone: [phone]  [facility]  Phone: [phone]  

## 2022-04-09 NOTE — MAU Provider Note (Signed)
History     CSN: [id number]  Arrival date and time: 04/09/22 0998   Event Date/Time   First Provider Initiated Contact with Patient 04/09/22 1222      Chief Complaint  Patient presents with   Emesis   Abdominal Pain   HPI  Kristine Brown is a 31 y.o. [id number] at [redacted]w[redacted]d who presents from the ED for evaluation of nausea, vomiting and abdominal pain. Patient reports she was ok on Tuesday after receiving all her medication in MAU. Yesterday she was unable to keep the medications down at all. She reports she is constantly nauseous and vomiting all day. She also reports lower abdominal cramping. Patient rates the pain as a 5/10 and has not tried anything for the pain. She denies any vaginal bleeding, discharge, and leaking of fluid. Denies any constipation, diarrhea or any urinary complaints.   OB History     Gravida  5   Para  2   Term  2   Preterm  0   AB  1   Living  2      SAB      IAB      Ectopic      Multiple      Live Births  2           Past Medical History:  Diagnosis Date   Anxiety    Asthma    Depression    HIV (human immunodeficiency virus infection) (HCC)     Past Surgical History:  Procedure Laterality Date   NO PAST SURGERIES      Family History  Problem Relation Age of Onset   Healthy Mother    Cancer Father    COPD Father    Healthy Daughter    Healthy Son     Social History   Tobacco Use   Smoking status: Former    Types: Cigarettes   Smokeless tobacco: Never  Building services engineer Use: Never used  Substance Use Topics   Alcohol use: Never   Drug use: Not Currently    Types: Marijuana    Allergies: No Known Allergies  No medications prior to admission.    Review of Systems  Constitutional: Negative.  Negative for fatigue and fever.  HENT: Negative.    Respiratory: Negative.  Negative for shortness of breath.   Cardiovascular: Negative.  Negative for chest pain.  Gastrointestinal:  Positive for abdominal pain,  nausea and vomiting. Negative for constipation and diarrhea.  Genitourinary: Negative.  Negative for dysuria, vaginal bleeding and vaginal discharge.  Neurological: Negative.  Negative for dizziness and headaches.   Physical Exam   Blood pressure 109/66, pulse 78, temperature 98.7 F (37.1 C), temperature source Oral, resp. rate 18, height 5\' 2"  (1.575 m), weight 61.6 kg, last menstrual period 02/27/2022, SpO2 99 %, unknown if currently breastfeeding.  Patient Vitals for the past 24 hrs:  BP Temp Temp src Pulse Resp SpO2 Height Weight  04/09/22 1517 109/66 -- -- 78 -- -- -- --  04/09/22 0958 130/71 98.7 F (37.1 C) Oral -- 18 99 % 5\' 2"  (1.575 m) 61.6 kg  04/09/22 0908 -- -- -- -- -- -- 5\' 2"  (1.575 m) 61.7 kg  04/09/22 0904 (!) 120/104 98.7 F (37.1 C) -- 60 16 99 % -- --    Physical Exam Vitals and nursing note reviewed.  Constitutional:      General: She is not in acute distress.    Appearance: She is well-developed.  HENT:  Head: Normocephalic.  Eyes:     Pupils: Pupils are equal, round, and reactive to light.  Cardiovascular:     Rate and Rhythm: Normal rate and regular rhythm.     Heart sounds: Normal heart sounds.  Pulmonary:     Effort: Pulmonary effort is normal. No respiratory distress.     Breath sounds: Normal breath sounds.  Abdominal:     General: Bowel sounds are normal. There is no distension.     Palpations: Abdomen is soft.     Tenderness: There is no abdominal tenderness.  Skin:    General: Skin is warm and dry.  Neurological:     Mental Status: She is alert and oriented to person, place, and time.  Psychiatric:        Mood and Affect: Mood normal.        Behavior: Behavior normal.        Thought Content: Thought content normal.        Judgment: Judgment normal.    MAU Course  Procedures  Results for orders placed or performed during the hospital encounter of 04/09/22 (from the past 24 hour(s))  Comprehensive metabolic panel     Status:  Abnormal   Collection Time: 04/09/22  9:21 AM  Result Value Ref Range   Sodium 132 (L) 135 - 145 mmol/L   Potassium 3.5 3.5 - 5.1 mmol/L   Chloride 102 98 - 111 mmol/L   CO2 21 (L) 22 - 32 mmol/L   Glucose, Bld 109 (H) 70 - 99 mg/dL   BUN 6 6 - 20 mg/dL   Creatinine, Ser 3.15 0.44 - 1.00 mg/dL   Calcium 9.7 8.9 - 40.0 mg/dL   Total Protein 7.4 6.5 - 8.1 g/dL   Albumin 4.2 3.5 - 5.0 g/dL   AST 21 15 - 41 U/L   ALT 19 0 - 44 U/L   Alkaline Phosphatase 45 38 - 126 U/L   Total Bilirubin 0.5 0.3 - 1.2 mg/dL   GFR, Estimated >86 >76 mL/min   Anion gap 9 5 - 15  CBC with Differential     Status: Abnormal   Collection Time: 04/09/22  9:21 AM  Result Value Ref Range   WBC 9.0 4.0 - 10.5 K/uL   RBC 3.73 (L) 3.87 - 5.11 MIL/uL   Hemoglobin 11.6 (L) 12.0 - 15.0 g/dL   HCT 19.5 (L) 09.3 - 26.7 %   MCV 90.3 80.0 - 100.0 fL   MCH 31.1 26.0 - 34.0 pg   MCHC 34.4 30.0 - 36.0 g/dL   RDW 12.4 58.0 - 99.8 %   Platelets 328 150 - 400 K/uL   nRBC 0.0 0.0 - 0.2 %   Neutrophils Relative % 65 %   Neutro Abs 5.8 1.7 - 7.7 K/uL   Lymphocytes Relative 25 %   Lymphs Abs 2.2 0.7 - 4.0 K/uL   Monocytes Relative 10 %   Monocytes Absolute 0.9 0.1 - 1.0 K/uL   Eosinophils Relative 0 %   Eosinophils Absolute 0.0 0.0 - 0.5 K/uL   Basophils Relative 0 %   Basophils Absolute 0.0 0.0 - 0.1 K/uL   Immature Granulocytes 0 %   Abs Immature Granulocytes 0.02 0.00 - 0.07 K/uL  Urinalysis, Routine w reflex microscopic Urine, Clean Catch     Status: Abnormal   Collection Time: 04/09/22 10:14 AM  Result Value Ref Range   Color, Urine YELLOW YELLOW   APPearance HAZY (A) CLEAR   Specific Gravity, Urine 1.031 (H) 1.005 -  1.030   pH 5.0 5.0 - 8.0   Glucose, UA NEGATIVE NEGATIVE mg/dL   Hgb urine dipstick NEGATIVE NEGATIVE   Bilirubin Urine NEGATIVE NEGATIVE   Ketones, ur 80 (A) NEGATIVE mg/dL   Protein, ur 30 (A) NEGATIVE mg/dL   Nitrite NEGATIVE NEGATIVE   Leukocytes,Ua NEGATIVE NEGATIVE   RBC / HPF 0-5 0 -  5 RBC/hpf   WBC, UA 0-5 0 - 5 WBC/hpf   Bacteria, UA NONE SEEN NONE SEEN   Squamous Epithelial / LPF 6-10 0 - 5   Mucus PRESENT      US OB Comp Less 14 Wks  Result Date: 04/09/2022 CLINICAL DATA:  Pelvic pain, vomiting. EXAM: OBSTETRIC <14 WK ULTRASOUND TECHNIQUE: Transabdominal ultrasound was performed for evaluation of the gestation as well as the maternal uterus and adnexal regions. COMPARISON:  None Available. FINDINGS: Intrauterine gestational sac: Single Yolk sac:  Visualized. Embryo:  Visualized. Cardiac Activity: Visualized. Heart Rate: 135 bpm CRL:   8.5 mm   6 w 6 d                  [place] EDC: November 26, 2021. Subchorionic hemorrhage:  Small subchorionic hemorrhage is noted. Maternal uterus/adnexae: Right ovary is unremarkable. Left ovary is not visualized. No free fluid is noted. IMPRESSION: Single live intrauterine gestation of 6 weeks 6 days. Small subchorionic hemorrhage is noted. Electronically Signed   By: Lupita Raider M.D.   On: 04/09/2022 14:37     MDM Labs ordered and reviewed.   UA CBC, CMP LR bolus Scop patch Compazine  Patient reports resolution of nausea  US OB Comp Less 14 weeks with Transvaginal  CNM independently reviewed the imaging ordered. Imaging show live IUP  Assessment and Plan   1. Normal intrauterine pregnancy on prenatal ultrasound in first trimester   2. [redacted] weeks gestation of pregnancy   3. Nausea/vomiting in pregnancy     -Discharge home in stable condition -Rx for phenergan suppositories sent to pharmacy -First trimester precautions discussed -Patient advised to follow-up with OB as scheduled for prenatal care -Patient may return to MAU as needed or if her condition were to change or worsen  Rolm Bookbinder, CNM 04/09/2022, 12:22 PM

## 2022-04-10 ENCOUNTER — Other Ambulatory Visit: Payer: Self-pay | Admitting: Advanced Practice Midwife

## 2022-04-10 ENCOUNTER — Telehealth: Payer: Self-pay

## 2022-04-10 ENCOUNTER — Other Ambulatory Visit (HOSPITAL_COMMUNITY): Payer: Self-pay

## 2022-04-10 ENCOUNTER — Encounter (HOSPITAL_COMMUNITY): Payer: Self-pay | Admitting: Family Medicine

## 2022-04-10 DIAGNOSIS — Z3A01 Less than 8 weeks gestation of pregnancy: Secondary | ICD-10-CM

## 2022-04-10 DIAGNOSIS — R112 Nausea with vomiting, unspecified: Secondary | ICD-10-CM

## 2022-04-10 DIAGNOSIS — E860 Dehydration: Secondary | ICD-10-CM

## 2022-04-10 LAB — URINALYSIS, ROUTINE W REFLEX MICROSCOPIC
Bacteria, UA: NONE SEEN
Bilirubin Urine: NEGATIVE
Glucose, UA: NEGATIVE mg/dL
Hgb urine dipstick: NEGATIVE
Ketones, ur: 80 mg/dL — AB
Leukocytes,Ua: NEGATIVE
Nitrite: NEGATIVE
Protein, ur: 30 mg/dL — AB
Specific Gravity, Urine: 1.028 (ref 1.005–1.030)
pH: 5 (ref 5.0–8.0)

## 2022-04-10 MED ORDER — METHYLPREDNISOLONE SODIUM SUCC 125 MG IJ SOLR
40.0000 mg | Freq: Once | INTRAMUSCULAR | Status: AC
  Administered 2022-04-10: 40 mg via INTRAVENOUS
  Filled 2022-04-10: qty 2

## 2022-04-10 MED ORDER — LACTATED RINGERS IV SOLN: Freq: Once | INTRAVENOUS | Status: AC

## 2022-04-10 MED ORDER — HYOSCYAMINE SULFATE 0.125 MG SL SUBL
0.2500 mg | SUBLINGUAL_TABLET | Freq: Once | SUBLINGUAL | Status: AC
  Administered 2022-04-10: 0.25 mg via SUBLINGUAL
  Filled 2022-04-10: qty 2

## 2022-04-10 MED ORDER — ONDANSETRON 4 MG PO TBDP
8.0000 mg | ORAL_TABLET | Freq: Once | ORAL | Status: AC
  Administered 2022-04-10: 8 mg via ORAL
  Filled 2022-04-10: qty 2

## 2022-04-10 MED ORDER — HYOSCYAMINE SULFATE SL 0.125 MG SL SUBL
1.0000 | SUBLINGUAL_TABLET | Freq: Two times a day (BID) | SUBLINGUAL | 1 refills | Status: DC | PRN
  Filled 2022-04-10: qty 20, 10d supply, fill #0

## 2022-04-10 MED ORDER — SODIUM CHLORIDE 0.9 % IV SOLN
12.5000 mg | Freq: Once | INTRAVENOUS | Status: AC
  Administered 2022-04-10: 12.5 mg via INTRAVENOUS
  Filled 2022-04-10: qty 0.5

## 2022-04-10 NOTE — Progress Notes (Signed)
RN entered pt room to reassess pain after Levsin was given at 0616. Pt was asleep as RN entered room, but woke up. RN asked pt if her pain was better after the medication, pt responded "I don't know, you just woke me up".. pt stated "are you ready for me to go home" RN asked "are you ready to go home?" And pt stated "I'm ready to feel better than I do now" RN asked for clarification.. pt stated she was nauseous and "I feel like I'm about to barf, can't you hear it in my chest". RN asked pt if she would like medication to help relieve indigestion and pt stated "I don't know". CNM made aware.

## 2022-04-10 NOTE — Telephone Encounter (Signed)
Message received 04/10/22 from [place], [place] requesting infusion appt for patient. Requested provider put in orders and contact office once ready for scheduling.

## 2022-04-10 NOTE — MAU Note (Signed)
Wynelle Bourgeois CNM at bedside to talk with pt.

## 2022-04-10 NOTE — MAU Note (Signed)
.  Kristine Brown is a 31 y.o. at [redacted]w[redacted]d here in MAU reporting continued n/v. Was here yesterday for n/v. Unable to keep down meds. Reports pink vag d/c tonight.   Onset of complaint: ongoing n/v. Pink d/c tonight Pain score: 10 Vitals:   04/10/22 0040 04/10/22 0043  BP:  115/75  Pulse: 63   Resp: 17   Temp: 98.6 F (37 C)   SpO2: 100%      FHT: Lab orders placed from triage:  u/a

## 2022-04-10 NOTE — MAU Note (Signed)
Pt is ready for d/c but states she does not want to leave. States she felt better after flds and meds but after she drank some juice her stomach starting hurting. STates she cannot go home and take care of her 31yr old and does not want her to see her like this. Pt states she has not been able to take her HIV meds because she tries and throws them up. Pt states she may have to get an abortion as she cannot do this. Pt complains of upper abd pain and is crying out and very restless. She states she has not smoked weed in 6 days but feels like she is "high".

## 2022-04-10 NOTE — MAU Provider Note (Signed)
Chief Complaint: Emesis During Pregnancy and Vaginal Bleeding   Event Date/Time   First Provider Initiated Contact with Patient 04/10/22 0105        SUBJECTIVE HPI: Kristine Brown is a 31 y.o. G5P2012 at [redacted]w[redacted]d by LMP who presents to maternity admissions reporting recurrent nausea and vomiting.  Was seen here yesterday for the same complaint.   This is her 3rd visit in 2 days. .States she has tried all the medications given to her and has even tried suppositories.  Cannot remember names of the meds she took  States cannot keep anything down.  She denies urinary symptoms, h/a, dizziness, or fever/chills.   Told RN she had pink spotting, did not report to me Has known small subchorionic hemorrhage  RN Note: Kristine Brown is a 31 y.o. at [redacted]w[redacted]d here in MAU reporting continued n/v. Was here yesterday for n/v. Unable to keep down meds. Reports pink vag d/c tonight.    Emesis  This is a recurrent problem. The current episode started in the past 7 days. The problem has been unchanged. There has been no fever. Associated symptoms include weight loss (0.4kg in one week). Pertinent negatives include no abdominal pain, chills, diarrhea, fever, headaches or myalgias. Treatments tried: states took all meds.    Past Medical History:  Diagnosis Date   Anxiety    Asthma    Depression    HIV (human immunodeficiency virus infection) (HCC)    Past Surgical History:  Procedure Laterality Date   NO PAST SURGERIES     Social History   Socioeconomic History   Marital status: Single    Spouse name: Not on file   Number of children: Not on file   Years of education: Not on file   Highest education level: Not on file  Occupational History   Not on file  Tobacco Use   Smoking status: Former    Types: Cigarettes   Smokeless tobacco: Never  Vaping Use   Vaping Use: Never used  Substance and Sexual Activity   Alcohol use: Never   Drug use: Not Currently    Types: Marijuana   Sexual activity: Not  Currently    Comment: pt given condoms  Other Topics Concern   Not on file  Social History Narrative   Not on file   Social Determinants of Health   Financial Resource Strain: Not on file  Food Insecurity: Not on file  Transportation Needs: Not on file  Physical Activity: Not on file  Stress: Not on file  Social Connections: Not on file  Intimate Partner Violence: Not on file   No current facility-administered medications on file prior to encounter.   Current Outpatient Medications on File Prior to Encounter  Medication Sig Dispense Refill   hydrOXYzine (VISTARIL) 25 MG capsule Take 1 capsule (25 mg total) by mouth 3 (three) times daily as needed. 30 capsule 0   ondansetron (ZOFRAN-ODT) 8 MG disintegrating tablet Take 1 tablet (8 mg total) by mouth every 8 (eight) hours as needed for nausea or vomiting. 30 tablet 1   promethazine (PHENERGAN) 25 MG tablet Take 1 tablet (25 mg total) by mouth every 6 (six) hours as needed for nausea or vomiting. 30 tablet 1   acetaminophen (TYLENOL) 500 MG tablet Take 2 tablets (1,000 mg total) by mouth every 6 (six) hours as needed. 100 tablet 2   albuterol (VENTOLIN HFA) 108 (90 Base) MCG/ACT inhaler Inhale into the lungs.     dolutegravir (TIVICAY) 50 MG tablet Take  1 tablet (50 mg total) by mouth daily. 30 tablet 5   emtricitabine-tenofovir AF (DESCOVY) 200-25 MG tablet Take 1 tablet by mouth daily. 30 tablet 5   Multiple Vitamins-Minerals (ONCOVITE) TABS Take 1 tablet by mouth daily.     ondansetron (ZOFRAN) 8 MG tablet Take 1 tablet (8 mg total) by mouth every 8 (eight) hours as needed for nausea or vomiting. 20 tablet 0   potassium chloride (KLOR-CON M) 10 MEQ tablet Take 1 tablet (10 mEq total) by mouth 2 (two) times daily. 6 tablet 0   promethazine (PHENERGAN) 25 MG suppository Place 1 suppository (25 mg total) rectally every 6 (six) hours as needed for nausea or vomiting. 12 each 0   scopolamine (TRANSDERM-SCOP) 1 MG/3DAYS Place 1 patch onto  the skin every 3 (three) days as directed. 10 patch 0   No Known Allergies  I have reviewed patient's Past Medical Hx, Surgical Hx, Family Hx, Social Hx, medications and allergies.   ROS:  Review of Systems  Constitutional:  Positive for weight loss (0.4kg in one week). Negative for chills and fever.  Gastrointestinal:  Positive for vomiting. Negative for abdominal pain and diarrhea.  Musculoskeletal:  Negative for myalgias.  Neurological:  Negative for headaches.   Review of Systems  Other systems negative   Physical Exam  Physical Exam Patient Vitals for the past 24 hrs:  BP Temp Pulse Resp SpO2 Height Weight  04/10/22 0043 115/75 -- -- -- -- -- --  04/10/22 0040 -- 98.6 F (37 C) 63 17 100 % 5\' 2"  (1.575 m) 61.2 kg   Constitutional: Well-developed, well-nourished female in no acute distress.  Cardiovascular: normal rate Respiratory: normal effort GI: Abd soft, non-tender.  MS: Extremities nontender, no edema, normal ROM Neurologic: Alert and oriented x 4.  GU: Neg CVAT.   LAB RESULTS Results for orders placed or performed during the hospital encounter of 04/09/22 (from the past 24 hour(s))  Comprehensive metabolic panel     Status: Abnormal   Collection Time: 04/09/22  9:21 AM  Result Value Ref Range   Sodium 132 (L) 135 - 145 mmol/L   Potassium 3.5 3.5 - 5.1 mmol/L   Chloride 102 98 - 111 mmol/L   CO2 21 (L) 22 - 32 mmol/L   Glucose, Bld 109 (H) 70 - 99 mg/dL   BUN 6 6 - 20 mg/dL   Creatinine, Ser 13/09/23 0.44 - 1.00 mg/dL   Calcium 9.7 8.9 - 0.62 mg/dL   Total Protein 7.4 6.5 - 8.1 g/dL   Albumin 4.2 3.5 - 5.0 g/dL   AST 21 15 - 41 U/L   ALT 19 0 - 44 U/L   Alkaline Phosphatase 45 38 - 126 U/L   Total Bilirubin 0.5 0.3 - 1.2 mg/dL   GFR, Estimated 69.4 >85 mL/min   Anion gap 9 5 - 15  CBC with Differential     Status: Abnormal   Collection Time: 04/09/22  9:21 AM  Result Value Ref Range   WBC 9.0 4.0 - 10.5 K/uL   RBC 3.73 (L) 3.87 - 5.11 MIL/uL    Hemoglobin 11.6 (L) 12.0 - 15.0 g/dL   HCT 13/09/23 (L) 27.0 - 35.0 %   MCV 90.3 80.0 - 100.0 fL   MCH 31.1 26.0 - 34.0 pg   MCHC 34.4 30.0 - 36.0 g/dL   RDW 09.3 81.8 - 29.9 %   Platelets 328 150 - 400 K/uL   nRBC 0.0 0.0 - 0.2 %   Neutrophils Relative %  65 %   Neutro Abs 5.8 1.7 - 7.7 K/uL   Lymphocytes Relative 25 %   Lymphs Abs 2.2 0.7 - 4.0 K/uL   Monocytes Relative 10 %   Monocytes Absolute 0.9 0.1 - 1.0 K/uL   Eosinophils Relative 0 %   Eosinophils Absolute 0.0 0.0 - 0.5 K/uL   Basophils Relative 0 %   Basophils Absolute 0.0 0.0 - 0.1 K/uL   Immature Granulocytes 0 %   Abs Immature Granulocytes 0.02 0.00 - 0.07 K/uL  Urinalysis, Routine w reflex microscopic Urine, Clean Catch     Status: Abnormal   Collection Time: 04/09/22 10:14 AM  Result Value Ref Range   Color, Urine YELLOW YELLOW   APPearance HAZY (A) CLEAR   Specific Gravity, Urine 1.031 (H) 1.005 - 1.030   pH 5.0 5.0 - 8.0   Glucose, UA NEGATIVE NEGATIVE mg/dL   Hgb urine dipstick NEGATIVE NEGATIVE   Bilirubin Urine NEGATIVE NEGATIVE   Ketones, ur 80 (A) NEGATIVE mg/dL   Protein, ur 30 (A) NEGATIVE mg/dL   Nitrite NEGATIVE NEGATIVE   Leukocytes,Ua NEGATIVE NEGATIVE   RBC / HPF 0-5 0 - 5 RBC/hpf   WBC, UA 0-5 0 - 5 WBC/hpf   Bacteria, UA NONE SEEN NONE SEEN   Squamous Epithelial / LPF 6-10 0 - 5   Mucus PRESENT     --/--/O POS (12/12 1357)  IMAGING   MAU Management/MDM: I have reviewed the triage vital signs and the nursing notes.   Pertinent labs & imaging results that were available during my care of the patient were reviewed by me and considered in my medical decision making (see chart for details).      I have reviewed her medical records including past results, notes and treatments.   Ordered IV fluids (2 liters given), and meds.   Treatments in MAU included Promethazine and Solumedrol since she has failed several meds.   Did well for a while but then complained of worsening nausea.  No vomitng while  here.[name] Zofran given with relief.  Second liter of IVF given for extra hydration.  Patient slept for a while while finishing infusion.  Stated she felt better.  Discharged home, but then patient started gagging and admits she tried to make herself vomit.  Then complained of epigastric pain following this episode.  We gave her Levsin which relieved her pain.    ASSESSMENT Single IUP at [redacted]w[redacted]d Hyperemesis vs cannaboid hyperemesis (last use a week ago) Pink bleeding due to subchorionic hemorrhage  PLAN Discharge home Continue meds as previously prescribed Rx Levsin for intestinal cramping Advance diet as tolerated Message sent to try to get her in for infusion clinic (I could not get orders to go through) Encouraged to return if she develops worsening of symptoms, increase in pain, fever, or other concerning symptoms.    Pt stable at time of discharge. Encouraged to return here if she develops worsening of symptoms, increase in pain, fever, or other concerning symptoms.    Wynelle Bourgeois CNM, MSN Certified Nurse-Midwife 04/10/2022  1:05 AM

## 2022-04-10 NOTE — Progress Notes (Signed)
RN entered pt room to review discharge instructions. Pt reports nausea has subsided and denies pain at this time; vitals are as follows:  Vitals:   04/10/22 0430 04/10/22 0510  BP: (!) 101/50 118/72  Pulse: 60 82  Resp: 17   Temp: 99 F (37.2 C)   SpO2: 100%     Pt went to use the bathroom and when she returned she informed RN that she was dizzy and nauseous. Pt requesting ginger ale and cranberry juice. Pt given beverages and RN informed her I would return.   CNM made aware.   3276 RN returned to pt room to finish discharge. Pt states "I'm not leaving, if ya'll discharge me I will go sit in the ER". Pt states she is nauseous. Pt is tearful and walked into the restroom.   CNM notified.

## 2022-04-24 ENCOUNTER — Other Ambulatory Visit (HOSPITAL_COMMUNITY): Payer: Self-pay

## 2022-04-28 ENCOUNTER — Other Ambulatory Visit (HOSPITAL_COMMUNITY): Payer: Self-pay

## 2022-04-29 ENCOUNTER — Encounter: Payer: Self-pay | Admitting: Family

## 2022-04-29 ENCOUNTER — Ambulatory Visit (INDEPENDENT_AMBULATORY_CARE_PROVIDER_SITE_OTHER): Payer: Medicaid Other | Admitting: Family

## 2022-04-29 ENCOUNTER — Other Ambulatory Visit (HOSPITAL_COMMUNITY): Payer: Self-pay

## 2022-04-29 ENCOUNTER — Other Ambulatory Visit: Payer: Self-pay

## 2022-04-29 VITALS — BP 121/76 | HR 95 | Temp 98.5°F | Wt 136.0 lb

## 2022-04-29 DIAGNOSIS — O0991 Supervision of high risk pregnancy, unspecified, first trimester: Secondary | ICD-10-CM

## 2022-04-29 DIAGNOSIS — O0990 Supervision of high risk pregnancy, unspecified, unspecified trimester: Secondary | ICD-10-CM | POA: Insufficient documentation

## 2022-04-29 DIAGNOSIS — Z21 Asymptomatic human immunodeficiency virus [HIV] infection status: Secondary | ICD-10-CM | POA: Diagnosis present

## 2022-04-29 NOTE — Patient Instructions (Addendum)
Nice to see you.  We will check your lab work today.  Continue to take your medication daily as prescribed.  Refills have been sent to the pharmacy.  Plan for follow up in 4 months or sooner if needed with lab work on the same day.  Have a great day and stay safe!  

## 2022-04-29 NOTE — Assessment & Plan Note (Signed)
Rudine has continued to take her Tivicay and Descovy as prescribed. Reviewed previous lab work and discussed plan of care. Continue current dose of Tivicay and Descovy. Check lab work today. Plan for follow up in 4 months or sooner if needed with lab work on the same day.

## 2022-04-29 NOTE — Progress Notes (Signed)
Brief Narrative   Patient ID: Kristine Brown, female    DOB: 1990-10-01, 31 y.o.   MRN: [id number]  Ms. Organ is a 31 y/o AA female diagnosed with HIV in May of 2019 with risk factor of heterosexual contact. Initial CD4 count of 769 and viral load of 16,400. Genotype with no significant resistance patterns. Entered care at  [facility] Stage 1. [id number] negative. Sole medication regimen of Biktarvy.    Subjective:    Chief Complaint  Patient presents with   Follow-up    One missed dose/ mostion    HPI:  Kristine Brown is a 31 y.o. female with HIV disease last seen on 01/27/22 with well controlled virus and good adherence and tolerance to [employer]. In the interim she has had a positive pregnancy test and medication was switched from [place] to [place] and [place]. Has been seen for nausea and vomiting in the MAU on mulitple occasions. Here today for routine follow up.  Kristine Brown has continued to have nausea and vomiting and questions if it is related to her medication or pregnancy. Recalls having an extended period of time with Biktarvy where she had consistent nausea and vomiting. Is able to take her medication and prenatal vitamin but continues to have vomiting primarily in the morning and the sensation of miotion sickness. Has not smoked in several weeks. Nausea medications prescribed do not seem to be helping. Has follow up with OB tomorrow and is currently estimated at 8.5 weeks of pregnancy.   Denies fevers, chills, night sweats, headaches, changes in vision, neck pain/stiffness, diarrhea, lesions or rashes.   No Known Allergies    Outpatient Medications Prior to Visit  Medication Sig Dispense Refill   acetaminophen (TYLENOL) 500 MG tablet Take 2 tablets (1,000 mg total) by mouth every 6 (six) hours as needed. 100 tablet 2   albuterol (VENTOLIN HFA) 108 (90 Base) MCG/ACT inhaler Inhale into the lungs.     dolutegravir (TIVICAY) 50 MG tablet Take 1 tablet (50 mg total) by mouth daily.  30 tablet 5   emtricitabine-tenofovir AF (DESCOVY) 200-25 MG tablet Take 1 tablet by mouth daily. 30 tablet 5   hydrOXYzine (VISTARIL) 25 MG capsule Take 1 capsule (25 mg total) by mouth 3 (three) times daily as needed. 30 capsule 0   Hyoscyamine Sulfate SL (LEVSIN/SL) 0.125 MG SUBL Place 1 tablet under the tongue every 12 (twelve) hours as needed. 20 tablet 1   Multiple Vitamins-Minerals (ONCOVITE) TABS Take 1 tablet by mouth daily.     ondansetron (ZOFRAN) 8 MG tablet Take 1 tablet (8 mg total) by mouth every 8 (eight) hours as needed for nausea or vomiting. 20 tablet 0   ondansetron (ZOFRAN-ODT) 8 MG disintegrating tablet Take 1 tablet (8 mg total) by mouth every 8 (eight) hours as needed for nausea or vomiting. 30 tablet 1   potassium chloride (KLOR-CON M) 10 MEQ tablet Take 1 tablet (10 mEq total) by mouth 2 (two) times daily. 6 tablet 0   promethazine (PHENERGAN) 25 MG suppository Place 1 suppository (25 mg total) rectally every 6 (six) hours as needed for nausea or vomiting. 12 each 0   promethazine (PHENERGAN) 25 MG tablet Take 1 tablet (25 mg total) by mouth every 6 (six) hours as needed for nausea or vomiting. 30 tablet 1   scopolamine (TRANSDERM-SCOP) 1 MG/3DAYS Place 1 patch onto the skin every 3 (three) days as directed. 10 patch 0   No facility-administered medications prior to visit.     Past  Medical History:  Diagnosis Date   Anxiety    Asthma    Depression    HIV (human immunodeficiency virus infection) (HCC)      Past Surgical History:  Procedure Laterality Date   NO PAST SURGERIES        Review of Systems  Constitutional:  Negative for appetite change, chills, diaphoresis, fatigue, fever and unexpected weight change.  Eyes:        Negative for acute change in vision  Respiratory:  Negative for chest tightness, shortness of breath and wheezing.   Cardiovascular:  Negative for chest pain.  Gastrointestinal:  Positive for nausea and vomiting. Negative for  constipation and diarrhea.  Genitourinary:  Negative for dysuria, pelvic pain and vaginal discharge.  Musculoskeletal:  Negative for neck pain and neck stiffness.  Skin:  Negative for rash.  Neurological:  Positive for dizziness. Negative for seizures, syncope, weakness and headaches.  Hematological:  Negative for adenopathy. Does not bruise/bleed easily.  Psychiatric/Behavioral:  Negative for hallucinations.       Objective:    BP 121/76   Pulse 95   Temp 98.5 F (36.9 C) (Oral)   Wt 136 lb (61.7 kg)   LMP 02/27/2022   SpO2 94%   BMI 24.87 kg/m  Nursing note and vital signs reviewed.  Physical Exam Constitutional:      General: She is not in acute distress.    Appearance: She is well-developed.  Cardiovascular:     Rate and Rhythm: Normal rate and regular rhythm.     Heart sounds: Normal heart sounds.  Pulmonary:     Effort: Pulmonary effort is normal.     Breath sounds: Normal breath sounds.  Skin:    General: Skin is warm and dry.  Neurological:     Mental Status: She is alert and oriented to person, place, and time.         09/08/2021    3:19 PM 07/22/2021    9:24 AM 03/05/2021    4:06 PM  Depression screen PHQ 2/9  Decreased Interest 1 0 2  Down, Depressed, Hopeless 1 0 2  PHQ - 2 Score 2 0 4  Altered sleeping 1    Tired, decreased energy 1    Change in appetite 1    Feeling bad or failure about yourself  0    Trouble concentrating 1    Moving slowly or fidgety/restless 0    Suicidal thoughts 0    PHQ-9 Score 6         Assessment & Plan:    Patient Active Problem List   Diagnosis Date Noted   High-risk pregnancy in first trimester 04/29/2022   SAB (spontaneous abortion) 09/08/2021   Contraception management 09/08/2021   Healthcare maintenance 07/22/2021   Depression 11/17/2019   Anxiety 05/12/2018   Asymptomatic HIV infection (HCC) 05/12/2018   Abdominal pain in female 04/13/2016     Problem List Items Addressed This Visit       Other    Asymptomatic HIV infection (HCC) - Primary    Vincenza has continued to take her Tivicay and Descovy as prescribed. Reviewed previous lab work and discussed plan of care. Continue current dose of Tivicay and Descovy. Check lab work today. Plan for follow up in 4 months or sooner if needed with lab work on the same day.       Relevant Orders   Basic metabolic panel   T-helper cell (CD4)- ([facility] clinic only)   HIV-1 RNA quant-no reflex-bld  High-risk pregnancy in first trimester    Kristine Brown is currently about 8.[redacted] weeks pregnant and have nausea and vomiting of unclear origin related to either medication or pregnancy. Has follow up with OB for continued care.         I am having Kristine Brown maintain her albuterol, Oncovite, ondansetron, Tivicay, Descovy, scopolamine, ondansetron, hydrOXYzine, potassium chloride, promethazine, acetaminophen, promethazine, and Hyoscyamine Sulfate SL.   No orders of the defined types were placed in this encounter.    Follow-up: Return in about 4 months (around 08/28/2022), or if symptoms worsen or fail to improve.   Terri Piedra, MSN, FNP-C Nurse Practitioner [facility] [place] number: [phone]

## 2022-04-29 NOTE — Assessment & Plan Note (Signed)
Kristine Brown is currently about 8.[redacted] weeks pregnant and have nausea and vomiting of unclear origin related to either medication or pregnancy. Has follow up with OB for continued care.

## 2022-04-30 ENCOUNTER — Telehealth (INDEPENDENT_AMBULATORY_CARE_PROVIDER_SITE_OTHER): Payer: Medicaid Other

## 2022-04-30 DIAGNOSIS — O0990 Supervision of high risk pregnancy, unspecified, unspecified trimester: Secondary | ICD-10-CM

## 2022-04-30 DIAGNOSIS — Z3689 Encounter for other specified antenatal screening: Secondary | ICD-10-CM

## 2022-04-30 LAB — T-HELPER CELL (CD4) - (RCID CLINIC ONLY)
CD4 % Helper T Cell: 47 % (ref 33–65)
CD4 T Cell Abs: 1050 /uL (ref 400–1790)

## 2022-04-30 MED ORDER — BLOOD PRESSURE MONITORING DEVI: 1.0000 | 0 refills | Status: DC

## 2022-04-30 MED ORDER — GOJJI WEIGHT SCALE MISC: 1.0000 | 0 refills | Status: DC

## 2022-04-30 NOTE — Progress Notes (Deleted)
New OB Intake  I connected withNAME@ on 04/30/22 at  3:15 PM EST by {Contact:24193} Video Visit and verified that I am speaking with the correct person using two identifiers. Nurse is located at [facility] and pt is located at ***.  I discussed the limitations, risks, security and privacy concerns of performing an evaluation and management service by telephone and the availability of in person appointments. I also discussed with the patient that there may be a patient responsible charge related to this service. The patient expressed understanding and agreed to proceed.  I explained I am completing New OB Intake today. We discussed EDD of *** that is based on LMP of ***. Pt is G***/P***. I reviewed her allergies, medications, Medical/Surgical/OB history, and appropriate screenings. I informed her of [facility] services. [facility] information placed in AVS. Based on history, this is a {Pregnancy Risk:28335} pregnancy.  Patient Active Problem List   Diagnosis Date Noted   High-risk pregnancy in first trimester 04/29/2022   SAB (spontaneous abortion) 09/08/2021   Contraception management 09/08/2021   Healthcare maintenance 07/22/2021   Depression 11/17/2019   Anxiety 05/12/2018   Asymptomatic HIV infection (HCC) 05/12/2018   Abdominal pain in female 04/13/2016    Concerns addressed today  Delivery Plans Plans to deliver at [facility]. Patient given information for [facility] Healthy Baby website for more information about [facility]. Patient {Is/is not:9024} interested in water birth. Offered upcoming OB visit with CNM to discuss further.  MyChart/Babyscripts MyChart access verified. I explained pt will have some visits in office and some virtually. Babyscripts instructions given and order placed. Patient verifies receipt of registration text/e-mail. Account successfully created and app downloaded.  Blood Pressure Cuff/Weight Scale {blood pressure cuff:24241} Explained after first prenatal appt  pt will check weekly and document in Babyscripts. Patient {weight scale:28336}.  Anatomy US Explained first scheduled [place] will be around 19 weeks. Anatomy US scheduled for *** at ***. Pt notified to arrive at ***.  Labs Discussed Avelina Laine genetic screening with patient. Would like both Panorama and Horizon drawn at new OB visit. Routine prenatal labs needed.  COVID Vaccine Patient {HAS/HAS NOT:20194} had COVID vaccine.   Is patient a CenteringPregnancy candidate?  {Accepted:19197::"Accepted","Not a Candidate","Declined"} Declined due to {Declined:19197::"Schedule","Childcare","Group setting","Support person concern","Declined to say","Enrolled in [facility]","***"} Not a candidate due to {Not a Candidate:19197::"DM","CHTN, medication controlled","Language barrier",">28 weeks","Multiple gestation (mono-mono or mono-di)","Complex coordination of care needed","***"} If accepted,    Is patient a Mom+Baby Combined Care candidate?  {Accepted:19197::"Accepted","Declined","Not a candidate","***"}   If accepted, Mom+Baby staff notified  Social Determinants of Health Food Insecurity: {food:25541} WIC Referral: Patient {ACTION; IS/IS FHL:[id number]} interested in referral to [facility].  Transportation: {transportation:25542} Childcare: Discussed no children allowed at ultrasound appointments. Offered childcare services; {childcare:25540}  First visit review I reviewed new OB appt with patient. I explained they will have a provider visit that includes ***. Explained pt will be seen by *** at first visit; encounter routed to appropriate provider. Explained that patient will be seen by pregnancy navigator following visit with provider.   Henrietta Dine, CMA 04/30/2022  3:17 PM

## 2022-04-30 NOTE — Progress Notes (Signed)
New OB Intake  I connected withNAME@ on 04/30/22 at  3:15 PM EST by MyChart Video Visit and verified that I am speaking with the correct person using two identifiers. Nurse is located at [facility] and pt is located at home.  I discussed the limitations, risks, security and privacy concerns of performing an evaluation and management service by telephone and the availability of in person appointments. I also discussed with the patient that there may be a patient responsible charge related to this service. The patient expressed understanding and agreed to proceed.  I explained I am completing New OB Intake today. We discussed EDD of 12/04/22 that is based on LMP of 02/27/22. Pt is G10/P2. I reviewed her allergies, medications, Medical/Surgical/OB history, and appropriate screenings. I informed her of [facility] services. [facility] information placed in AVS. Based on history, this is a high risk pregnancy.  Patient Active Problem List   Diagnosis Date Noted   Supervision of high risk pregnancy, antepartum 04/29/2022   SAB (spontaneous abortion) 09/08/2021   Contraception management 09/08/2021   Healthcare maintenance 07/22/2021   Depression 11/17/2019   Anxiety 05/12/2018   Asymptomatic HIV infection (HCC) 05/12/2018   Abdominal pain in female 04/13/2016    Concerns addressed today  Delivery Plans Plans to deliver at [facility]. Patient given information for [facility] Healthy Baby website for more information about [facility]. Patient is not interested in water birth. Offered upcoming OB visit with CNM to discuss further.  MyChart/Babyscripts MyChart access verified. I explained pt will have some visits in office and some virtually. Babyscripts instructions given and order placed. Patient verifies receipt of registration text/e-mail. Account successfully created and app downloaded.  Blood Pressure Cuff/Weight Scale Blood pressure cuff ordered for patient to pick-up from [employer]. Explained  after first prenatal appt pt will check weekly and document in Babyscripts. Patient does not have weight scale; order sent to [facility], patient may track weight weekly in Babyscripts.  Anatomy US Explained first scheduled [place] will be around 19 weeks. Anatomy US scheduled for 07/10/22 at 1030a. Pt notified to arrive at 1015a.  Labs Discussed Avelina Laine genetic screening with patient. Would like both Panorama and Horizon drawn at new OB visit. Routine prenatal labs needed.  COVID Vaccine Patient has not had COVID vaccine.   Is patient a CenteringPregnancy candidate?  Not a Candidate Declined due to  NA Not a candidate due to Complex coordination of care needed If accepted,    Is patient a Mom+Baby Combined Care candidate?  Not a candidate   If accepted, Mom+Baby staff notified  Social Determinants of Health Food Insecurity: Patient denies food insecurity. WIC Referral: Patient is interested in referral to [facility].  Transportation: Patient denies transportation needs. Childcare: Discussed no children allowed at ultrasound appointments. Offered childcare services; patient declines childcare services at this time.  First visit review I reviewed new OB appt with patient. I explained they will have a provider visit that includes . Explained pt will be seen by Dr. Donavan Foil at first visit; encounter routed to appropriate provider. Explained that patient will be seen by pregnancy navigator following visit with provider.   Henrietta Dine, CMA 04/30/2022  3:41 PM

## 2022-04-30 NOTE — Patient Instructions (Signed)
AREA PEDIATRIC/FAMILY PRACTICE PHYSICIANS  Central/[place]outheast [place] ([address]) [facility] Chambliss, MD; Eniola, MD; Hale, MD; Hensel, MD; McDiarmid, MD; McIntyer, MD; Clay Menser, MD; Walden, MD [address] [place], [place] [address] [phone] Mon-Fri 8:30-12:30, 1:30-5:00 Providers come to see babies at [facility] Accepting Medicaid [facility] providers who accept newborns: Koirala, MD; Morrow, MD; Wolters, MD [address], [place], [place] [address] [phone] Mon-Fri 8:00-5:30 Babies seen by providers at [facility] Does NOT accept Medicaid Please call early in hospitalization for appointment (limited availability)  [facility] Mulberry, MD [address] [place], [place] [address] [phone] Mon, Tue, Thur, Fri 8:30-5:00, Wed 10:00-7:00 (closed 1-2pm) Babies seen by [facility] providers Accepting Medicaid Rubin - Pediatrician Rubin, MD [address], [place], [place] [address] [phone] Mon-Fri 8:30-5:00, Sat 8:30-12:00 Provider comes to see babies at [facility] Accepting Medicaid Must have been referred from current patients or contacted office prior to delivery [facility] ([facility]) Brown, MD; Chandler, MD; Ettefagh, MD; Grant, MD; Lester, MD; McCormick, MD; McQueen, MD; Prose, MD; Simha, MD; Stanley, MD; Stryffeler, NP; Tebben, NP [address], [place], [place] [address] [phone] Mon, Tue, Thur, Fri 8:30-5:30, Wed 9:30-5:30, Sat 8:30-12:30 Babies seen by [facility] providers Accepting Medicaid Only accepting infants of first-time parents or siblings of current patients Hospital discharge coordinator will make follow-up appointment Jack Amos [address], [place], [place]  [address] [phone]   Fax - [phone] [facility] [address], [place], [place]  [address] Phone - [phone]   Fax - [phone] Shilpa Gosrani [address], [place], [place]  [address] [phone]  [place] ([address]) [facility] Bates, MD; [place], MD; Cooper, Cox, MD; MD; Davis, MD; Dovico, MD; Ettefaugh, MD; Little, MD; Lowe, MD; Keiffer, MD; Melvin, MD; Sumner, MD; Williams, MD [address], [place], [place] [address] [phone] Mon-Fri 8:30-5:00 (extended evenings Mon-Thur as needed), Sat-Sun 10:00-1:00 Providers come to see babies at [facility] Accepting Medicaid for families of first-time babies and families with all children in the household age 3 and under. Must register with office prior to making appointment (M-F only). [facility] Henson, NP; Knapp, MD; Lalonde, MD; Tysinger, PA [address] [place], [place] [address] [phone] Mon-Fri 8:00-5:00 Babies seen by providers at [facility] Does NOT accept Medicaid/Commercial Insurance Only [facility] ([facility])  Artis, MD; Barnes, MD; Bratton, MD; Coccaro, MD; Lockett Gardner, MD; Kramer, MD; Marshall, MD; Netherton, MD; Poleto, MD; Skinner, MD [address] [place], [place] [address] [phone] Mon-Fri 8:30-5:30, Sat (Oct.-Mar.) 9:00-1:00 Babies seen by providers at [facility] Accepting Medicaid  [place] ([address]) [facility] Reid, MD; Warner, MD [address], [place],  [address] [phone] Mon-Fri 8:30-5:00, Sat 8:30-12:00 Providers come to see babies at [facility] Does NOT accept Medicaid [facility] Becker, PA; Hagler, MD; Scifres, PA; Sun, MD; Swayne, MD [address], [place],  [address] [phone] Mon-Fri 8:00-5:00 Babies seen by providers at [facility] Does NOT accept Medicaid Only accepting babies of parents who  are patients Please call early in hospitalization for appointment (limited availability)  Pediatricians Clark, MD; Frye, MD; Kelleher, MD; Mack, NP; Miller, MD; O'Keller, MD; Patterson, NP; Pudlo, MD; Puzio, MD; Thomas, MD; Tucker, MD; Twiselton, MD [address], [place], [place] [address] [phone] Mon-Fri 8:00-5:00, Sat 9:00-12:00 Providers come to see babies at [facility] Does NOT accept Medicaid  [place] ([address]) [facility] providers accepting new patients: Brake, NP; Wharton, PA [address], [place], [place] [address] [phone] Mon-Fri 8:00-5:00 Babies seen by providers at [facility] Does NOT accept Medicaid Only accepting babies of parents who are patients Please call early in hospitalization for appointment (limited availability) [facility] Gay, MD; Quinlan, MD [address] [place], [place] [address] [phone] (press 1 to schedule appointment) Mon-Fri 8:00-5:00 Providers come to see babies at [facility] Does NOT accept Medicaid [facility] Mazer, MD [address] [place], [place] [address] [phone] Mon-Fri 8:30-5:00 (lunch 12:30-1:00), extended hours by appointment only Wed 5:00-6:30 Babies seen by [facility] providers Accepting Medicaid [facility] Banks, MD; Jordan, MD; Koberlein, MD [address], [place], [place] [address] [phone] Mon-Fri 8:00-5:00 Babies seen by [facility] providers Does NOT accept Medicaid [facility] Parker, MD; Hunter, MD; Wallace, DO [address] [place], [place] [address] [phone] Mon-Fri 8:00-5:00 Babies seen by [facility] providers Does NOT accept Medicaid [facility] Brandon, PA; Brecken, PA; Christy, NP; Dees, MD; DeClaire, MD; DeWeese, MD; Hansen, NP; Mills, NP; Parrish, NP; Smoot, NP; Summer, MD; Vapne,  MD [address] [place], [place] [address] [phone] Mon-Fri 8:30-5:00, Sat 10:00-1:00 Providers come to see babies at [facility] Does NOT accept Medicaid Free prenatal information session Tuesdays at 4:45pm [facility] Bouska, MD; Gordon, PA; Jeffery, PA; Weber, PA [address] [place] [address] [phone] Mon-Fri 7:30-5:30 Babies seen by [facility] providers [facility] [address], [place], [place]  [address] [phone]   Fax - [phone]  [place] ([address] & [address]) [facility] [place] [address] [place], [place] [address] [phone] Mon-Thur 8:00-6:00 Providers come to see babies at [facility] Accepting Medicaid [facility] Anderson, NP; [place], MD; Beal, PA; Spencer, PA [address] [place], [place] [address] [phone] Mon-Thur 7:30-7:30, Fri 7:30-4:30 Babies seen by [facility] providers Accepting Medicaid [facility] Agbuya, MD; Klett, NP; Romgoolam, MD [address] [place] [address], [place], [place] [address] [phone] Mon-Fri 8:30-5:00, Sat 8:30-12:00 Providers come to see babies at [facility] Accepting Medicaid Must have "Meet & Greet" appointment at office prior to delivery [facility] ([facility] McCord, MD; Wallace, MD; Wood, MD [address], [place], [place] [address] [phone] Mon-Wed 8:00-6:00, Thur-Fri 8:00-5:00, Sat 9:00-12:00 Providers come to see babies at [facility] Does NOT accept Medicaid Only accepting siblings of current patients [facility]  [address], [place], [place]  [address] [phone]   Fax - [phone] [facility] [address], [place], [place]  [address] [phone]   Fax -  [phone]  [place]  ([address] & [address]) [facility] Cirigliano, DO; Matthews, DO [address] , [place] [address] [phone] Mon-Fri 7:00-5:00 Babies seen by [facility] providers Does NOT accept Medicaid [facility] Briscoe, MD; Howley, PA; Moreira, PA [address], [place], [place] [address] [phone] Mon-Fri 8:00-5:00 Babies seen by [facility] providers Accepting Medicaid [facility] Boyd, MD; Church, PA; Jones, NP; Osborn, PA [address], , [place] [address] [phone] Mon-Fri 8:00-5:00 Babies seen by providers at [facility] Accepting Medicaid  [place] ([address]) [facility] [place] Wend[place], DO [address] [place], [place] [address] [phone] Mon-Fri 8:00-5:00 Babies seen by [facility] providers Does NOT accept Medicaid Limited availability, please call early in hospitalization to schedule follow-up [facility] Calderon, PA; Cummings, MD; Dillard, MD; Martin, PA; Olson, MD; VanDeven, PA [address], [place], [place] [address] [phone] Mon-Fri 8:30-5:00, Sat 9:00-12:00 Babies seen by providers at [facility] Accepting Medicaid Please register online then schedule online or call office [email or web address] Hunter, NP; Kumar, MD; Martin Rogers, PA [address], [place], [place] [address] [phone] Mon-Fri 8:00-5:00 Babies seen by providers at [facility] Accepting Medicaid [facility] ([facility]) [place], MD; Kristi Fleenor, NP; West, MD [address], [place], [place] [address] [phone] Mon-Fri 8:00-5:30, Sat&Sun by appointment (phones open at  8:30) Babies seen by [facility] providers Accepting Medicaid Must be a first-time baby or sibling of current patient [facility] [place]  [address], [place], [place]  [address] [phone]   Fax - [phone]  [place] ([address]) [facility] Brown, PA; Cowen, PA; Rice, MD; Helton, PA; Spry, MD [address] [place], [place] [address] [phone] Mon-Thur 8:00-7:00, Fri 8:00-5:00, Sat 8:00-12:00, Sun 9:00-12:00 Babies seen by [facility] providers Accepting Medicaid [facility] [place] Coe-Goins, MD; Marshall, MD; Pierre-Louis, MD [address] [place] [address], [place], [place] [address] [phone] Mon-Thur 8:00-5:00 Babies seen by providers at [facility] Accepting Medicaid [facility] Bratton, MD; Coe-Goins, MD; Hayes, MD; Lewis, MD; List, MD; Lott, MD; Marshall, MD; Moran, MD; O'Angeni Chaudhuri, MD; Pierre-Louis, MD; Pitonzo, MD; Scholer, MD; Spangle, MD [address] [place], [place] [address] [phone] Mon-Fri 8:00-5:30, Sat (Oct.-Mar.) 9:00-1:00 Babies seen by providers at [facility] Accepting Medicaid Must fill out new patient packet, available online at [email or web address] [facility] ([facility]) Friddle, NP; Harris, NP; Kelly, NP; Logan, MD; Melvin, PA; Poth, MD; Ramadoss, MD; Stanton, NP [address], [place], [place] [address] [phone] Mon-Thur 8:00-5:30, Fri 8:00-5:00 Babies seen by providers at [facility] Accepting Medicaid  [place] ([address]) [facility] Dixon, PA; [place], MD; Pickard, MD; Tapia, PA [address], [place], [place] [address] [phone] Mon-Fri 8:00-5:00 Babies seen by providers at [facility] Accepting Medicaid   [place] ([address]) [facility] [place] Masneri, DO; Meyers, MD; Nelson, PA [address], [place], [place] [address] [phone] Mon-Fri 8:00-5:00 Babies seen by providers at [facility] Does NOT accept Medicaid Limited appointment availability, please call early in hospitalization   [facility] [place] Kunedd, DO; McGowen, MD [address], [place], [place] [address] [phone] Mon-Fri 8:00-5:00 Babies seen by [facility] providers Does NOT accept Medicaid [facility] [place] Cameron, MD; MacDonald, MD; Michaels, PA; Nayak, MD [address] [place] [address], [place], [place] [address] [phone] Mon-Fri 8:00-5:00 After hours clinic ([address] [place], [place] [address]) [phone] Mon-Fri 5:00-8:00, Sat 12:00-6:00, Sun 10:00-4:00 Babies seen by [facility] providers Accepting Medicaid [facility] [place] [address], [place], [place]  [address] [phone]   Fax - [phone]  [place] ([address]) [facility] [place] [facility] Andy, MD [address], [place], [place] [address] [phone] Mon-Fri 8:00-5:00 Babies seen by [facility] providers Does NOT accept Medicaid [facility] [place] ([facility] [place]) Eksir, MD [address], [place], [place] [address] [phone] Mon-Thur 8:00-7:00, Fri 8:00-5:00, Sat 8:00-12:00 Babies seen by providers at [facility] Accepting Medicaid - but does not have vaccinations in office (must be received elsewhere) Limited availability, please call early in hospitalization  [place] ([address]) [facility]  Charlene Flemming, MD [address], [place] [address] [phone]  Fax [phone]  [facility]  Kimberly Newton, MD, Annamarie Streilein, PA, Carla [place], PA [address] [place], [place]  [address] [phone] [facility]  [address], [place], [place] [address] [phone] [address], [place], [place] [address] [phone] (West Office)  [facility] [address], [place], [place] [address] [phone] [facility] [address] [place], [place], [place] [address] [phone] [facility] [address], [place], [place] [address] [phone] [facility] [address], [place], [place] [address] [phone] [facility] [address], [place], [place] [address] [phone] [facility] [address], [place], [place] [address] [phone] [facility]  [address], [place], [place] [address] [phone] Dr. [facility] Little [address] [place], , [place] [address] [phone] [facility] [address], [place], [place] [address] [phone] [facility] [address], ,  [address] [phone]  

## 2022-05-02 LAB — BASIC METABOLIC PANEL
BUN: 8 mg/dL (ref 7–25)
CO2: 24 mmol/L (ref 20–32)
Calcium: 9.6 mg/dL (ref 8.6–10.2)
Chloride: 102 mmol/L (ref 98–110)
Creat: 0.6 mg/dL (ref 0.50–0.97)
Glucose, Bld: 89 mg/dL (ref 65–99)
Potassium: 4.1 mmol/L (ref 3.5–5.3)
Sodium: 136 mmol/L (ref 135–146)

## 2022-05-02 LAB — HIV-1 RNA QUANT-NO REFLEX-BLD
HIV 1 RNA Quant: NOT DETECTED Copies/mL
HIV-1 RNA Quant, Log: NOT DETECTED Log cps/mL

## 2022-05-14 ENCOUNTER — Other Ambulatory Visit (HOSPITAL_COMMUNITY)
Admission: RE | Admit: 2022-05-14 | Discharge: 2022-05-14 | Disposition: A | Discharge status: Home / Self Care | Payer: Medicaid Other | Source: Ambulatory Visit | Attending: Obstetrics and Gynecology | Admitting: Obstetrics and Gynecology

## 2022-05-14 ENCOUNTER — Ambulatory Visit (INDEPENDENT_AMBULATORY_CARE_PROVIDER_SITE_OTHER): Payer: Medicaid Other | Admitting: Obstetrics and Gynecology

## 2022-05-14 ENCOUNTER — Encounter: Payer: Self-pay | Admitting: Obstetrics and Gynecology

## 2022-05-14 VITALS — BP 121/80 | HR 85 | Wt 138.2 lb

## 2022-05-14 DIAGNOSIS — O219 Vomiting of pregnancy, unspecified: Secondary | ICD-10-CM | POA: Diagnosis not present

## 2022-05-14 DIAGNOSIS — O0991 Supervision of high risk pregnancy, unspecified, first trimester: Secondary | ICD-10-CM

## 2022-05-14 DIAGNOSIS — O0990 Supervision of high risk pregnancy, unspecified, unspecified trimester: Secondary | ICD-10-CM | POA: Insufficient documentation

## 2022-05-14 DIAGNOSIS — Z21 Asymptomatic human immunodeficiency virus [HIV] infection status: Secondary | ICD-10-CM | POA: Diagnosis not present

## 2022-05-14 DIAGNOSIS — Z3A10 10 weeks gestation of pregnancy: Secondary | ICD-10-CM | POA: Diagnosis not present

## 2022-05-14 DIAGNOSIS — Z3A00 Weeks of gestation of pregnancy not specified: Secondary | ICD-10-CM | POA: Insufficient documentation

## 2022-05-14 NOTE — Progress Notes (Signed)
INITIAL PRENATAL VISIT NOTE  Subjective:  Kristine Brown is a 31 y.o. [id number] at [redacted]w[redacted]d by LMP being seen today for her initial prenatal visit. LMP is c/w early dating scan. She has an obstetric history significant for SVD x 2. She has a medical history significant for HIV infection but is compliant with meds and receives care with ID.  Patient reports  nausea, but that has been improved with the scopolamine patch. .  Contractions: Not present. Vag. Bleeding: None.  Movement: Absent. Denies leaking of fluid.    Past Medical History:  Diagnosis Date   Anxiety    Asthma    Depression    HIV (human immunodeficiency virus infection) (HCC)     Past Surgical History:  Procedure Laterality Date   NO PAST SURGERIES      OB History  Gravida Para Term Preterm AB Living  10 2 2  0 7 2  SAB IAB Ectopic Multiple Live Births  6 1     2     # Outcome Date GA Lbr Len/2nd Weight Sex Delivery Anes PTL Lv  10 Current           9 SAB           8 SAB           7 SAB           6 IAB           5 SAB           4 SAB           3 SAB           2 Term     F Vag-Spont  N LIV  1 Term     M Vag-Spont  N LIV    Social History   Socioeconomic History   Marital status: Single    Spouse name: Not on file   Number of children: Not on file   Years of education: Not on file   Highest education level: Not on file  Occupational History   Not on file  Tobacco Use   Smoking status: Former    Types: Cigarettes   Smokeless tobacco: Never  Vaping Use   Vaping Use: Never used  Substance and Sexual Activity   Alcohol use: Never   Drug use: Not Currently    Types: Marijuana   Sexual activity: Yes    Birth control/protection: None    Comment: pt given condoms  Other Topics Concern   Not on file  Social History Narrative   Not on file   Social Determinants of Health   Financial Resource Strain: Not on file  Food Insecurity: Not on file  Transportation Needs: Not on file  Physical Activity:  Not on file  Stress: Not on file  Social Connections: Not on file    Family History  Problem Relation Age of Onset   Healthy Mother    Cancer Father    COPD Father    Healthy Daughter    Healthy Son      Current Outpatient Medications:    acetaminophen (TYLENOL) 500 MG tablet, Take 2 tablets (1,000 mg total) by mouth every 6 (six) hours as needed., Disp: 100 tablet, Rfl: 2   albuterol (VENTOLIN HFA) 108 (90 Base) MCG/ACT inhaler, Inhale into the lungs., Disp: , Rfl:    dolutegravir (TIVICAY) 50 MG tablet, Take 1 tablet (50 mg total) by mouth daily., Disp: 30 tablet, Rfl: 5  emtricitabine-tenofovir AF (DESCOVY) 200-25 MG tablet, Take 1 tablet by mouth daily., Disp: 30 tablet, Rfl: 5   Prenatal Vit-Fe Fumarate-FA (MULTIVITAMIN-PRENATAL) 27-0.8 MG TABS tablet, Take 1 tablet by mouth daily at 12 noon., Disp: , Rfl:    scopolamine (TRANSDERM-SCOP) 1 MG/3DAYS, Place 1 patch onto the skin every 3 (three) days as directed., Disp: 10 patch, Rfl: 0   Blood Pressure Monitoring DEVI, 1 each by Does not apply route once a week. (Patient not taking: Reported on 05/14/2022), Disp: 1 each, Rfl: 0   hydrOXYzine (VISTARIL) 25 MG capsule, Take 1 capsule (25 mg total) by mouth 3 (three) times daily as needed. (Patient not taking: Reported on 05/14/2022), Disp: 30 capsule, Rfl: 0   Hyoscyamine Sulfate SL (LEVSIN/SL) 0.125 MG SUBL, Place 1 tablet under the tongue every 12 (twelve) hours as needed. (Patient not taking: Reported on 05/14/2022), Disp: 20 tablet, Rfl: 1   Misc. Devices (GOJJI WEIGHT SCALE) MISC, 1 each by Does not apply route once a week. (Patient not taking: Reported on 05/14/2022), Disp: 1 each, Rfl: 0   Multiple Vitamins-Minerals (ONCOVITE) TABS, Take 1 tablet by mouth daily. (Patient not taking: Reported on 04/30/2022), Disp: , Rfl:    ondansetron (ZOFRAN) 8 MG tablet, Take 1 tablet (8 mg total) by mouth every 8 (eight) hours as needed for nausea or vomiting. (Patient not taking: Reported on  05/14/2022), Disp: 20 tablet, Rfl: 0   ondansetron (ZOFRAN-ODT) 8 MG disintegrating tablet, Take 1 tablet (8 mg total) by mouth every 8 (eight) hours as needed for nausea or vomiting. (Patient not taking: Reported on 05/14/2022), Disp: 30 tablet, Rfl: 1   potassium chloride (KLOR-CON M) 10 MEQ tablet, Take 1 tablet (10 mEq total) by mouth 2 (two) times daily. (Patient not taking: Reported on 04/30/2022), Disp: 6 tablet, Rfl: 0   promethazine (PHENERGAN) 25 MG suppository, Place 1 suppository (25 mg total) rectally every 6 (six) hours as needed for nausea or vomiting. (Patient not taking: Reported on 04/30/2022), Disp: 12 each, Rfl: 0   promethazine (PHENERGAN) 25 MG tablet, Take 1 tablet (25 mg total) by mouth every 6 (six) hours as needed for nausea or vomiting. (Patient not taking: Reported on 04/30/2022), Disp: 30 tablet, Rfl: 1  No Known Allergies  Review of Systems: Negative except for what is mentioned in HPI.  Objective:   Vitals:   05/14/22 1459  BP: 121/80  Pulse: 85  Weight: 138 lb 3.2 oz (62.7 kg)    Fetal Status: Fetal Heart Rate (bpm): 161   Movement: Absent     Physical Exam: BP 121/80   Pulse 85   Wt 138 lb 3.2 oz (62.7 kg)   LMP 02/27/2022 (Exact Date)   BMI 25.28 kg/m  CONSTITUTIONAL: Well-developed, well-nourished female in no acute distress.  NEUROLOGIC: Alert and oriented to person, place, and time. Normal reflexes, muscle tone coordination. No cranial nerve deficit noted. PSYCHIATRIC: Normal mood and affect. Normal behavior. Normal judgment and thought content. SKIN: Skin is warm and dry. No rash noted. Not diaphoretic. No erythema. No pallor. HENT:  Normocephalic, atraumatic, External right and left ear normal. Oropharynx is clear and moist EYES: Conjunctivae and EOM are normal.  NECK: Normal range of motion, supple, no masses CARDIOVASCULAR: Normal heart rate noted, regular rhythm RESPIRATORY: Effort and breath sounds normal, no problems with respiration  noted BREASTS: deferred ABDOMEN: Soft, nontender, nondistended, gravid. GU: normal appearing external female genitalia, multiparous, normal appearing cervix, scant white discharge in vagina, no lesions noted Bimanual: 10 weeks  sized uterus, no adnexal tenderness or palpable lesions noted MUSCULOSKELETAL: Normal range of motion. EXT:  No edema and no tenderness. 2+ distal pulses.   Assessment and Plan:  Pregnancy: [id number] at [redacted]w[redacted]d by LMP  1. Supervision of high risk pregnancy, antepartum Continue routine prenatal care  - Culture, OB Urine - CBC/D/Plt+RPR+Rh+ABO+RubIgG... - Hemoglobin A1c - Cytology - PAP( [place]) - Panorama Prenatal Test Full Panel - [place] Custom - GC/Chlamydia probe amp ([place])not at [facility]  2. [redacted] weeks gestation of pregnancy   3. Asymptomatic HIV infection (HCC) Last viral load was undetectable.  Pt receives care with ID and is compliant with antivral meds  4. Nausea/vomiting in pregnancy Seems to have resolved with the use of the scopolamine patch.  Pt's appetite has returned   Preterm labor symptoms and general obstetric precautions including but not limited to vaginal bleeding, contractions, leaking of fluid and fetal movement were reviewed in detail with the patient.  Please refer to After Visit Summary for other counseling recommendations.   Return in about 4 weeks (around 06/11/2022) for [facility], in person.  Warden Fillers 05/14/2022 3:43 PM

## 2022-05-15 LAB — CBC/D/PLT+RPR+RH+ABO+RUBIGG...
Antibody Screen: NEGATIVE
Basophils Absolute: 0 10*3/uL (ref 0.0–0.2)
Basos: 0 %
EOS (ABSOLUTE): 0 10*3/uL (ref 0.0–0.4)
Eos: 1 %
HCV Ab: NONREACTIVE
HIV Screen 4th Generation wRfx: REACTIVE
Hematocrit: 34 % (ref 34.0–46.6)
Hemoglobin: 11 g/dL — ABNORMAL LOW (ref 11.1–15.9)
Hepatitis B Surface Ag: NEGATIVE
Immature Grans (Abs): 0 10*3/uL (ref 0.0–0.1)
Immature Granulocytes: 0 %
Lymphocytes Absolute: 2.4 10*3/uL (ref 0.7–3.1)
Lymphs: 29 %
MCH: 30.6 pg (ref 26.6–33.0)
MCHC: 32.4 g/dL (ref 31.5–35.7)
MCV: 94 fL (ref 79–97)
Monocytes Absolute: 1.2 10*3/uL — ABNORMAL HIGH (ref 0.1–0.9)
Monocytes: 14 %
Neutrophils Absolute: 4.6 10*3/uL (ref 1.4–7.0)
Neutrophils: 56 %
Platelets: 363 10*3/uL (ref 150–450)
RBC: 3.6 x10E6/uL — ABNORMAL LOW (ref 3.77–5.28)
RDW: 13.7 % (ref 11.7–15.4)
RPR Ser Ql: NONREACTIVE
Rh Factor: POSITIVE
Rubella Antibodies, IGG: 1.32 index (ref >0.99)
WBC: 8.3 10*3/uL (ref 3.4–10.8)

## 2022-05-15 LAB — HEMOGLOBIN A1C
Est. average glucose Bld gHb Est-mCnc: 117 mg/dL
Hgb A1c MFr Bld: 5.7 % — ABNORMAL HIGH (ref 4.8–5.6)

## 2022-05-15 LAB — HIV 1/2 AB DIFFERENTIATION
HIV 1 Ab: REACTIVE
HIV 2 Ab: NONREACTIVE
NOTE (HIV CONF MULTIP: POSITIVE — AB

## 2022-05-15 LAB — GC/CHLAMYDIA PROBE AMP (~~LOC~~) NOT AT ARMC
Chlamydia: NEGATIVE
Comment: NEGATIVE
Comment: NORMAL
Neisseria Gonorrhea: NEGATIVE

## 2022-05-15 LAB — HCV INTERPRETATION

## 2022-05-16 LAB — CULTURE, OB URINE

## 2022-05-16 LAB — URINE CULTURE, OB REFLEX

## 2022-05-19 ENCOUNTER — Other Ambulatory Visit: Payer: Self-pay

## 2022-05-19 LAB — CYTOLOGY - PAP
Comment: NEGATIVE
Diagnosis: NEGATIVE
High risk HPV: NEGATIVE

## 2022-05-21 ENCOUNTER — Other Ambulatory Visit: Payer: Self-pay

## 2022-05-21 ENCOUNTER — Other Ambulatory Visit (HOSPITAL_COMMUNITY): Payer: Self-pay

## 2022-05-23 LAB — PANORAMA PRENATAL TEST FULL PANEL:PANORAMA TEST PLUS 5 ADDITIONAL MICRODELETIONS: FETAL FRACTION: 9.3

## 2022-05-27 LAB — HORIZON CUSTOM: REPORT SUMMARY: POSITIVE — AB

## 2022-06-01 ENCOUNTER — Inpatient Hospital Stay (HOSPITAL_COMMUNITY)
Admission: AD | Admit: 2022-06-01 | Discharge: 2022-06-02 | Disposition: A | Discharge status: Home / Self Care | Payer: Medicaid Other | Attending: Obstetrics and Gynecology | Admitting: Obstetrics and Gynecology

## 2022-06-01 ENCOUNTER — Encounter (HOSPITAL_COMMUNITY): Payer: Self-pay | Admitting: Obstetrics and Gynecology

## 2022-06-01 DIAGNOSIS — Z3A13 13 weeks gestation of pregnancy: Secondary | ICD-10-CM | POA: Insufficient documentation

## 2022-06-01 DIAGNOSIS — K529 Noninfective gastroenteritis and colitis, unspecified: Secondary | ICD-10-CM | POA: Diagnosis not present

## 2022-06-01 DIAGNOSIS — O218 Other vomiting complicating pregnancy: Secondary | ICD-10-CM | POA: Diagnosis present

## 2022-06-01 DIAGNOSIS — Z1152 Encounter for screening for COVID-19: Secondary | ICD-10-CM | POA: Insufficient documentation

## 2022-06-01 DIAGNOSIS — O219 Vomiting of pregnancy, unspecified: Secondary | ICD-10-CM | POA: Diagnosis not present

## 2022-06-01 LAB — URINALYSIS, ROUTINE W REFLEX MICROSCOPIC
Bilirubin Urine: NEGATIVE
Glucose, UA: NEGATIVE mg/dL
Hgb urine dipstick: NEGATIVE
Ketones, ur: 20 mg/dL — AB
Nitrite: NEGATIVE
Protein, ur: 30 mg/dL — AB
Specific Gravity, Urine: 1.028 (ref 1.005–1.030)
pH: 5 (ref 5.0–8.0)

## 2022-06-01 LAB — COMPREHENSIVE METABOLIC PANEL
ALT: 15 U/L (ref 0–44)
AST: 20 U/L (ref 15–41)
Albumin: 3.3 g/dL — ABNORMAL LOW (ref 3.5–5.0)
Alkaline Phosphatase: 43 U/L (ref 38–126)
Anion gap: 7 (ref 5–15)
BUN: 7 mg/dL (ref 6–20)
CO2: 20 mmol/L — ABNORMAL LOW (ref 22–32)
Calcium: 9.3 mg/dL (ref 8.9–10.3)
Chloride: 104 mmol/L (ref 98–111)
Creatinine, Ser: 0.65 mg/dL (ref 0.44–1.00)
GFR, Estimated: >60 mL/min (ref >60)
Glucose, Bld: 92 mg/dL (ref 70–99)
Potassium: 3.5 mmol/L (ref 3.5–5.1)
Sodium: 131 mmol/L — ABNORMAL LOW (ref 135–145)
Total Bilirubin: 0.3 mg/dL (ref 0.3–1.2)
Total Protein: 6.5 g/dL (ref 6.5–8.1)

## 2022-06-01 LAB — CBC
HCT: 32.5 % — ABNORMAL LOW (ref 36.0–46.0)
Hemoglobin: 11.5 g/dL — ABNORMAL LOW (ref 12.0–15.0)
MCH: 32.2 pg (ref 26.0–34.0)
MCHC: 35.4 g/dL (ref 30.0–36.0)
MCV: 91 fL (ref 80.0–100.0)
Platelets: 322 10*3/uL (ref 150–400)
RBC: 3.57 MIL/uL — ABNORMAL LOW (ref 3.87–5.11)
RDW: 13.8 % (ref 11.5–15.5)
WBC: 12 10*3/uL — ABNORMAL HIGH (ref 4.0–10.5)
nRBC: 0 % (ref 0.0–0.2)

## 2022-06-01 LAB — RESP PANEL BY RT-PCR (RSV, FLU A&B, COVID)  RVPGX2
Influenza A by PCR: NEGATIVE
Influenza B by PCR: NEGATIVE
Resp Syncytial Virus by PCR: NEGATIVE
SARS Coronavirus 2 by RT PCR: NEGATIVE

## 2022-06-01 MED ORDER — ONDANSETRON HCL 4 MG/2ML IJ SOLN
4.0000 mg | Freq: Once | INTRAMUSCULAR | Status: AC
  Administered 2022-06-01: 4 mg via INTRAVENOUS
  Filled 2022-06-01: qty 2

## 2022-06-01 MED ORDER — LACTATED RINGERS IV BOLUS
1000.0000 mL | Freq: Once | INTRAVENOUS | Status: AC
  Administered 2022-06-01: 1000 mL via INTRAVENOUS

## 2022-06-01 MED ORDER — DICYCLOMINE HCL 10 MG/5ML PO SOLN
20.0000 mg | Freq: Once | ORAL | Status: AC
  Administered 2022-06-01: 20 mg via ORAL
  Filled 2022-06-01: qty 10

## 2022-06-01 NOTE — MAU Note (Signed)
..  Kristine Brown is a 32 y.o. at [redacted]w[redacted]d here in MAU reporting: this evening started having sharp upper abdominal pain and nausea/vomiting. The pain comes and goes.  Denies vaginal bleeding or leaking of fluid.    Pain score: 10/10 Vitals:   06/01/22 2127  BP: 122/66  Pulse: 90  Resp: (!) 21  Temp: 99.8 F (37.7 C)  SpO2: 100%     FHT:152 Lab orders placed from triage:  ua

## 2022-06-01 NOTE — MAU Provider Note (Signed)
History     CSN: [id number]  Arrival date and time: 06/01/22 2059   Event Date/Time   First Provider Initiated Contact with Patient 06/01/22 2157      Chief Complaint  Patient presents with   Abdominal Pain   Nausea   Emesis   Kristine Brown is a 32 y.o. [id number] at [redacted]w[redacted]d who presents with 1 days of N/V/D. She also endorses fatigue, chills, and stomach spasms. Her last meal was this morning and has not been able to keep anything down. She states she ate foods for new years she usually does not eat and has 3 sodas and she usually does not drink soda. She states her mother also has some stomach discomfort this morning as well.    OB History     Gravida  10   Para  2   Term  2   Preterm  0   AB  7   Living  2      SAB  6   IAB  1   Ectopic      Multiple      Live Births  2           Past Medical History:  Diagnosis Date   Anxiety    Asthma    Depression    HIV (human immunodeficiency virus infection) ([place])     Past Surgical History:  Procedure Laterality Date   NO PAST SURGERIES      Family History  Problem Relation Age of Onset   Healthy Mother    Cancer Father    COPD Father    Healthy Daughter    Healthy Son     Social History   Tobacco Use   Smoking status: Former    Types: Cigarettes   Smokeless tobacco: Never  Scientific laboratory technician Use: Never used  Substance Use Topics   Alcohol use: Never   Drug use: Not Currently    Types: Marijuana    Allergies: No Known Allergies  Medications Prior to Admission  Medication Sig Dispense Refill Last Dose   dolutegravir (TIVICAY) 50 MG tablet Take 1 tablet (50 mg total) by mouth daily. 30 tablet 5 06/01/2022   emtricitabine-tenofovir AF (DESCOVY) 200-25 MG tablet Take 1 tablet by mouth daily. 30 tablet 5 06/01/2022   Prenatal Vit-Fe Fumarate-FA (MULTIVITAMIN-PRENATAL) 27-0.8 MG TABS tablet Take 1 tablet by mouth daily at 12 noon.   06/01/2022   acetaminophen (TYLENOL) 500 MG tablet Take 2  tablets (1,000 mg total) by mouth every 6 (six) hours as needed. 100 tablet 2    albuterol (VENTOLIN HFA) 108 (90 Base) MCG/ACT inhaler Inhale into the lungs.      Blood Pressure Monitoring DEVI 1 each by Does not apply route once a week. (Patient not taking: Reported on 05/14/2022) 1 each 0    hydrOXYzine (VISTARIL) 25 MG capsule Take 1 capsule (25 mg total) by mouth 3 (three) times daily as needed. (Patient not taking: Reported on 05/14/2022) 30 capsule 0    Hyoscyamine Sulfate SL (LEVSIN/SL) 0.125 MG SUBL Place 1 tablet under the tongue every 12 (twelve) hours as needed. (Patient not taking: Reported on 05/14/2022) 20 tablet 1    Misc. Devices (GOJJI WEIGHT SCALE) MISC 1 each by Does not apply route once a week. (Patient not taking: Reported on 05/14/2022) 1 each 0    Multiple Vitamins-Minerals (ONCOVITE) TABS Take 1 tablet by mouth daily. (Patient not taking: Reported on 04/30/2022)      ondansetron (  ZOFRAN) 8 MG tablet Take 1 tablet (8 mg total) by mouth every 8 (eight) hours as needed for nausea or vomiting. (Patient not taking: Reported on 05/14/2022) 20 tablet 0    ondansetron (ZOFRAN-ODT) 8 MG disintegrating tablet Take 1 tablet (8 mg total) by mouth every 8 (eight) hours as needed for nausea or vomiting. (Patient not taking: Reported on 05/14/2022) 30 tablet 1    potassium chloride (KLOR-CON M) 10 MEQ tablet Take 1 tablet (10 mEq total) by mouth 2 (two) times daily. (Patient not taking: Reported on 04/30/2022) 6 tablet 0    promethazine (PHENERGAN) 25 MG suppository Place 1 suppository (25 mg total) rectally every 6 (six) hours as needed for nausea or vomiting. (Patient not taking: Reported on 04/30/2022) 12 each 0    promethazine (PHENERGAN) 25 MG tablet Take 1 tablet (25 mg total) by mouth every 6 (six) hours as needed for nausea or vomiting. (Patient not taking: Reported on 04/30/2022) 30 tablet 1    scopolamine (TRANSDERM-SCOP) 1 MG/3DAYS Place 1 patch onto the skin every 3 (three) days as  directed. 10 patch 0     Review of Systems  Constitutional:  Positive for activity change, appetite change, chills and fatigue.  HENT:  Negative for congestion, rhinorrhea and sore throat.   Respiratory:  Negative for shortness of breath.   Cardiovascular:  Negative for chest pain.  Gastrointestinal:  Positive for abdominal pain, diarrhea, nausea and vomiting.  Genitourinary:  Negative for difficulty urinating and vaginal bleeding.  Neurological:  Positive for weakness and headaches.   Physical Exam   Blood pressure (!) 107/58, pulse 87, temperature 99.8 F (37.7 C), temperature source Oral, resp. rate (!) 21, height 5\' 2"  (1.575 m), weight 64 kg, last menstrual period 02/27/2022, SpO2 100 %, unknown if currently breastfeeding.  Physical Exam Vitals reviewed.  Constitutional:      General: She is not in acute distress.    Appearance: She is ill-appearing. She is not toxic-appearing or diaphoretic.  HENT:     Head: Normocephalic.  Eyes:     General: No scleral icterus.    Extraocular Movements: Extraocular movements intact.  Cardiovascular:     Rate and Rhythm: Normal rate and regular rhythm.     Heart sounds: Normal heart sounds.  Pulmonary:     Effort: Pulmonary effort is normal. No respiratory distress.     Breath sounds: Normal breath sounds. No stridor. No wheezing, rhonchi or rales.  Abdominal:     General: Abdomen is protuberant. There is no distension.     Palpations: Abdomen is soft.     Tenderness: There is generalized abdominal tenderness.  Neurological:     Mental Status: She is alert and oriented to person, place, and time.  Psychiatric:        Mood and Affect: Mood normal.        Behavior: Behavior normal.     MAU Course  Procedures  MDM Results for orders placed or performed during the hospital encounter of 06/01/22 (from the past 24 hour(s))  Urinalysis, Routine w reflex microscopic Urine, Clean Catch     Status: Abnormal   Collection Time: 06/01/22   9:44 PM  Result Value Ref Range   Color, Urine AMBER (A) YELLOW   APPearance HAZY (A) CLEAR   Specific Gravity, Urine 1.028 1.005 - 1.030   pH 5.0 5.0 - 8.0   Glucose, UA NEGATIVE NEGATIVE mg/dL   Hgb urine dipstick NEGATIVE NEGATIVE   Bilirubin Urine NEGATIVE NEGATIVE  Ketones, ur 20 (A) NEGATIVE mg/dL   Protein, ur 30 (A) NEGATIVE mg/dL   Nitrite NEGATIVE NEGATIVE   Leukocytes,Ua TRACE (A) NEGATIVE   RBC / HPF 0-5 0 - 5 RBC/hpf   WBC, UA 6-10 0 - 5 WBC/hpf   Bacteria, UA FEW (A) NONE SEEN   Squamous Epithelial / LPF 6-10 0 - 5 /HPF   Mucus PRESENT   CBC     Status: Abnormal   Collection Time: 06/01/22 10:34 PM  Result Value Ref Range   WBC 12.0 (H) 4.0 - 10.5 K/uL   RBC 3.57 (L) 3.87 - 5.11 MIL/uL   Hemoglobin 11.5 (L) 12.0 - 15.0 g/dL   HCT 26.7 (L) 12.4 - 58.0 %   MCV 91.0 80.0 - 100.0 fL   MCH 32.2 26.0 - 34.0 pg   MCHC 35.4 30.0 - 36.0 g/dL   RDW 99.8 33.8 - 25.0 %   Platelets 322 150 - 400 K/uL   nRBC 0.0 0.0 - 0.2 %  Comprehensive metabolic panel     Status: Abnormal   Collection Time: 06/01/22 10:34 PM  Result Value Ref Range   Sodium 131 (L) 135 - 145 mmol/L   Potassium 3.5 3.5 - 5.1 mmol/L   Chloride 104 98 - 111 mmol/L   CO2 20 (L) 22 - 32 mmol/L   Glucose, Bld 92 70 - 99 mg/dL   BUN 7 6 - 20 mg/dL   Creatinine, Ser 5.39 0.44 - 1.00 mg/dL   Calcium 9.3 8.9 - 76.7 mg/dL   Total Protein 6.5 6.5 - 8.1 g/dL   Albumin 3.3 (L) 3.5 - 5.0 g/dL   AST 20 15 - 41 U/L   ALT 15 0 - 44 U/L   Alkaline Phosphatase 43 38 - 126 U/L   Total Bilirubin 0.3 0.3 - 1.2 mg/dL   GFR, Estimated >34 >19 mL/min   Anion gap 7 5 - 15  Resp panel by RT-PCR (RSV, Flu A&B, Covid) Anterior Nasal Swab     Status: None   Collection Time: 06/01/22 10:34 PM   Specimen: Anterior Nasal Swab  Result Value Ref Range   SARS Coronavirus 2 by RT PCR NEGATIVE NEGATIVE   Influenza A by PCR NEGATIVE NEGATIVE   Influenza B by PCR NEGATIVE NEGATIVE   Resp Syncytial Virus by PCR NEGATIVE NEGATIVE    Upon re-evaluation & s/p IVF, bentyl, and zofran the patient stated she was feeling much better and sitting up. She tolerated PO and voiced she wanted to go home.   Assessment and Plan  1. Gastroenteritis - Discussed supportive care and hydration  2. [redacted] weeks gestation of pregnancy - Routine OB follow up  Future Appointments  Date Time Provider Department Center  06/15/2022  3:55 PM Adam Phenix, MD [facility]  06/23/2022 10:30 AM Irene Shipper, LCSW [facility]  07/10/2022 10:15 AM [facility] NURSE [facility]  07/10/2022 10:30 AM [facility] US3 [facility]       Joeziah Voit [place] 06/01/2022, 9:58 PM

## 2022-06-02 ENCOUNTER — Encounter: Payer: Self-pay | Admitting: Obstetrics and Gynecology

## 2022-06-02 DIAGNOSIS — K529 Noninfective gastroenteritis and colitis, unspecified: Secondary | ICD-10-CM

## 2022-06-02 DIAGNOSIS — Z3A13 13 weeks gestation of pregnancy: Secondary | ICD-10-CM

## 2022-06-02 DIAGNOSIS — D563 Thalassemia minor: Secondary | ICD-10-CM | POA: Insufficient documentation

## 2022-06-15 ENCOUNTER — Encounter: Payer: Self-pay | Admitting: Obstetrics & Gynecology

## 2022-06-15 ENCOUNTER — Ambulatory Visit (INDEPENDENT_AMBULATORY_CARE_PROVIDER_SITE_OTHER): Payer: Medicaid Other | Admitting: Obstetrics & Gynecology

## 2022-06-15 ENCOUNTER — Other Ambulatory Visit: Payer: Self-pay

## 2022-06-15 ENCOUNTER — Other Ambulatory Visit (HOSPITAL_COMMUNITY): Payer: Self-pay

## 2022-06-15 VITALS — BP 111/63 | HR 86 | Wt 145.7 lb

## 2022-06-15 DIAGNOSIS — O0992 Supervision of high risk pregnancy, unspecified, second trimester: Secondary | ICD-10-CM

## 2022-06-15 DIAGNOSIS — O219 Vomiting of pregnancy, unspecified: Secondary | ICD-10-CM

## 2022-06-15 DIAGNOSIS — Z21 Asymptomatic human immunodeficiency virus [HIV] infection status: Secondary | ICD-10-CM

## 2022-06-15 DIAGNOSIS — D563 Thalassemia minor: Secondary | ICD-10-CM

## 2022-06-15 DIAGNOSIS — Z3A15 15 weeks gestation of pregnancy: Secondary | ICD-10-CM

## 2022-06-15 DIAGNOSIS — O0990 Supervision of high risk pregnancy, unspecified, unspecified trimester: Secondary | ICD-10-CM

## 2022-06-15 NOTE — Progress Notes (Signed)
   PRENATAL VISIT NOTE  Subjective:  Kristine Brown is a 32 y.o. [id number] at [redacted]w[redacted]d being seen today for ongoing prenatal care.  She is currently monitored for the following issues for this high-risk pregnancy and has Abdominal pain in female; Anxiety; Asymptomatic HIV infection ([place]); Depression; Healthcare maintenance; SAB (spontaneous abortion); Contraception management; Supervision of high risk pregnancy, antepartum; Nausea/vomiting in pregnancy; and Alpha thalassemia silent carrier on their problem list.  Patient reports no complaints.  Contractions: Irritability. Vag. Bleeding: None.  Movement: Present. Denies leaking of fluid.   The following portions of the patient's history were reviewed and updated as appropriate: allergies, current medications, past family history, past medical history, past social history, past surgical history and problem list.   Objective:   Vitals:   06/15/22 1617  BP: 111/63  Pulse: 86  Weight: 145 lb 11.2 oz (66.1 kg)    Fetal Status: Fetal Heart Rate (bpm): 145   Movement: Present     General:  Alert, oriented and cooperative. Patient is in no acute distress.  Skin: Skin is warm and dry. No rash noted.   Cardiovascular: Normal heart rate noted  Respiratory: Normal respiratory effort, no problems with respiration noted  Abdomen: Soft, gravid, appropriate for gestational age.  Pain/Pressure: Absent     Pelvic: Cervical exam deferred        Extremities: Normal range of motion.  Edema: None  Mental Status: Normal mood and affect. Normal behavior. Normal judgment and thought content.   Assessment and Plan:  Pregnancy: [id number] at [redacted]w[redacted]d 1. Supervision of high risk pregnancy, antepartum Anatomy scan 3 weeks Declines AFP 2. Nausea/vomiting in pregnancy resolved  3. Asymptomatic HIV infection ([place]) Low viral load  Preterm labor symptoms and general obstetric precautions including but not limited to vaginal bleeding, contractions, leaking of fluid and  fetal movement were reviewed in detail with the patient. Please refer to After Visit Summary for other counseling recommendations.   Return in about 4 weeks (around 07/13/2022).  Future Appointments  Date Time Provider [place]  06/23/2022 10:30 AM Ernesto Rutherford [facility]  07/10/2022 10:15 AM [facility] NURSE [facility]  07/10/2022 10:30 AM [facility] US3 [facility]  07/13/2022  3:15 PM Aletha Halim, MD [facility]    Emeterio Reeve, MD

## 2022-06-17 ENCOUNTER — Other Ambulatory Visit (HOSPITAL_COMMUNITY): Payer: Self-pay

## 2022-06-18 ENCOUNTER — Other Ambulatory Visit (HOSPITAL_COMMUNITY): Payer: Self-pay

## 2022-06-18 ENCOUNTER — Other Ambulatory Visit: Payer: Self-pay

## 2022-06-23 ENCOUNTER — Other Ambulatory Visit: Payer: Self-pay

## 2022-06-23 ENCOUNTER — Ambulatory Visit: Payer: Medicaid Other

## 2022-06-30 NOTE — Progress Notes (Unsigned)
OBJECTIVE: Outpatient Therapy, Assess Mental Health Risk  DIAGNOSIS (PROVISIONAL): Depression  GOALS ADDRESSED: Patient will Reduce Anxiety/Depression  INTERVENTIONS UTILIZED: Supportive Counseling Therapist met with client as scheduled to continue rapport building. Therapist introduced and shared some information about herself and encouraged the same from client in order to build trust and openness within the counseling relationship. Therapist discussed and reviewed treatment plans with client based on the result from client's assessment. Therapist discussed client current mental health symptoms /diagnosis of DSM-5, as well as recommendations and target population for various eligibility services. Therapist assessed for SI/HI during session and will follow-up with client during the next session.  EFFECTIVENESS/PLAN: Client attended session with therapist as scheduled and presented alert; orient X4. On start of session, client affect appeared euphoric euthymic; affect was congruent with client report. Appearance was relaxed/casual, and attitude was appropriate. Client was receptive to rapport building by sharing information about him/herself, such as: expressing likes, dislikes, and strengths. Client was able to make connections between consequences, challenges and alternative thoughts of mental health recovery. Client progress toward therapeutic goal(s) are minimal at this time. Client denied SI/HI. 

## 2022-07-08 ENCOUNTER — Other Ambulatory Visit (HOSPITAL_COMMUNITY): Payer: Self-pay

## 2022-07-10 ENCOUNTER — Ambulatory Visit: Payer: Medicaid Other

## 2022-07-10 ENCOUNTER — Other Ambulatory Visit (HOSPITAL_COMMUNITY): Payer: Self-pay

## 2022-07-10 ENCOUNTER — Ambulatory Visit: Payer: Medicaid Other | Attending: Obstetrics and Gynecology

## 2022-07-10 ENCOUNTER — Other Ambulatory Visit: Payer: Self-pay

## 2022-07-10 VITALS — BP 138/68 | HR 88

## 2022-07-10 DIAGNOSIS — B20 Human immunodeficiency virus [HIV] disease: Secondary | ICD-10-CM

## 2022-07-10 DIAGNOSIS — O99342 Other mental disorders complicating pregnancy, second trimester: Secondary | ICD-10-CM

## 2022-07-10 DIAGNOSIS — F419 Anxiety disorder, unspecified: Secondary | ICD-10-CM

## 2022-07-10 DIAGNOSIS — O09892 Supervision of other high risk pregnancies, second trimester: Secondary | ICD-10-CM

## 2022-07-10 DIAGNOSIS — O0990 Supervision of high risk pregnancy, unspecified, unspecified trimester: Secondary | ICD-10-CM

## 2022-07-10 DIAGNOSIS — O98712 Human immunodeficiency virus [HIV] disease complicating pregnancy, second trimester: Secondary | ICD-10-CM | POA: Diagnosis not present

## 2022-07-10 DIAGNOSIS — D563 Thalassemia minor: Secondary | ICD-10-CM

## 2022-07-10 DIAGNOSIS — Z3A20 20 weeks gestation of pregnancy: Secondary | ICD-10-CM

## 2022-07-13 ENCOUNTER — Ambulatory Visit (INDEPENDENT_AMBULATORY_CARE_PROVIDER_SITE_OTHER): Payer: Medicaid Other | Admitting: Obstetrics and Gynecology

## 2022-07-13 ENCOUNTER — Other Ambulatory Visit: Payer: Self-pay

## 2022-07-13 VITALS — BP 121/70 | HR 93 | Wt 159.0 lb

## 2022-07-13 DIAGNOSIS — O402XX0 Polyhydramnios, second trimester, not applicable or unspecified: Secondary | ICD-10-CM

## 2022-07-13 DIAGNOSIS — O0990 Supervision of high risk pregnancy, unspecified, unspecified trimester: Secondary | ICD-10-CM

## 2022-07-13 DIAGNOSIS — O98712 Human immunodeficiency virus [HIV] disease complicating pregnancy, second trimester: Secondary | ICD-10-CM | POA: Insufficient documentation

## 2022-07-13 DIAGNOSIS — O409XX0 Polyhydramnios, unspecified trimester, not applicable or unspecified: Secondary | ICD-10-CM | POA: Insufficient documentation

## 2022-07-13 DIAGNOSIS — O0992 Supervision of high risk pregnancy, unspecified, second trimester: Secondary | ICD-10-CM

## 2022-07-13 DIAGNOSIS — O99810 Abnormal glucose complicating pregnancy: Secondary | ICD-10-CM | POA: Insufficient documentation

## 2022-07-13 DIAGNOSIS — Z3A20 20 weeks gestation of pregnancy: Secondary | ICD-10-CM

## 2022-07-13 NOTE — Progress Notes (Signed)
   PRENATAL VISIT NOTE  Subjective:  Kristine Brown is a 32 y.o. [id number] at [redacted]w[redacted]d being seen today for ongoing prenatal care.  She is currently monitored for the following issues for this high-risk pregnancy and has Anxiety; Asymptomatic HIV infection ([place]); Depression; Supervision of high risk pregnancy, antepartum; Nausea/vomiting in pregnancy; Alpha thalassemia silent carrier; Maternal infection with human immunodeficiency virus in second trimester; Polyhydramnios affecting pregnancy; and Abnormal glucose affecting pregnancy on their problem list.  Patient reports no complaints.  Contractions: Not present. Vag. Bleeding: None.  Movement: Present. Denies leaking of fluid.   The following portions of the patient's history were reviewed and updated as appropriate: allergies, current medications, past family history, past medical history, past social history, past surgical history and problem list.   Objective:   Vitals:   07/13/22 1534  BP: 121/70  Pulse: 93  Weight: 159 lb (72.1 kg)    Fetal Status: Fetal Heart Rate (bpm): 150   Movement: Present     General:  Alert, oriented and cooperative. Patient is in no acute distress.  Skin: Skin is warm and dry. No rash noted.   Cardiovascular: Normal heart rate noted  Respiratory: Normal respiratory effort, no problems with respiration noted  Abdomen: Soft, gravid, appropriate for gestational age.  Pain/Pressure: Present     Pelvic: Cervical exam deferred        Extremities: Normal range of motion.  Edema: None  Mental Status: Normal mood and affect. Normal behavior. Normal judgment and thought content.   Assessment and Plan:  Pregnancy: [id number] at [redacted]w[redacted]d 1. Supervision of high risk pregnancy, antepartum Routine care 2/9: poly, mvp 10; 381g, efw 88%, ac 93%  2. [redacted] weeks gestation of pregnancy  3. Maternal infection with human immunodeficiency virus in second trimester 11/29 VL undetectable and CD4 1050. I don't see a f/u visit so  will CC her ID providers about f/u. Continue on tivicay and descovy  4. Polyhydramnios affecting pregnancy At anatomy u/s.d/w her and early A1c was 5.7; d/w her re: need for early GDM screening with GTT. Pt to make lab only visit pt also aware if she passes then would need repeat routine screen again around 28wks.   5. Abnormal glucose affecting pregnancy  Preterm labor symptoms and general obstetric precautions including but not limited to vaginal bleeding, contractions, leaking of fluid and fetal movement were reviewed in detail with the patient. Please refer to After Visit Summary for other counseling recommendations.   No follow-ups on file.  Future Appointments  Date Time Provider [place]  07/23/2022  9:30 AM [facility]  08/03/2022  3:55 PM Griffin Basil, MD [facility]  08/07/2022 10:30 AM [facility] NURSE [facility]  08/07/2022 10:45 AM [facility] US4 [facility]    Aletha Halim, MD

## 2022-07-15 ENCOUNTER — Other Ambulatory Visit: Payer: Self-pay

## 2022-07-21 ENCOUNTER — Other Ambulatory Visit (HOSPITAL_COMMUNITY): Payer: Self-pay

## 2022-07-23 ENCOUNTER — Other Ambulatory Visit: Payer: Self-pay | Admitting: *Deleted

## 2022-07-23 ENCOUNTER — Other Ambulatory Visit: Payer: Medicaid Other

## 2022-07-23 DIAGNOSIS — O99810 Abnormal glucose complicating pregnancy: Secondary | ICD-10-CM

## 2022-07-24 LAB — GLUCOSE TOLERANCE, 2 HOURS W/ 1HR
Glucose, 1 hour: 116 mg/dL (ref 70–179)
Glucose, 2 hour: 106 mg/dL (ref 70–152)
Glucose, Fasting: 79 mg/dL (ref 70–91)

## 2022-08-03 ENCOUNTER — Other Ambulatory Visit: Payer: Self-pay

## 2022-08-03 ENCOUNTER — Ambulatory Visit (INDEPENDENT_AMBULATORY_CARE_PROVIDER_SITE_OTHER): Payer: Medicaid Other | Admitting: Obstetrics and Gynecology

## 2022-08-03 VITALS — BP 118/75 | HR 94 | Wt 165.7 lb

## 2022-08-03 DIAGNOSIS — O98712 Human immunodeficiency virus [HIV] disease complicating pregnancy, second trimester: Secondary | ICD-10-CM | POA: Diagnosis not present

## 2022-08-03 DIAGNOSIS — D563 Thalassemia minor: Secondary | ICD-10-CM

## 2022-08-03 DIAGNOSIS — O0992 Supervision of high risk pregnancy, unspecified, second trimester: Secondary | ICD-10-CM | POA: Diagnosis not present

## 2022-08-03 DIAGNOSIS — O402XX0 Polyhydramnios, second trimester, not applicable or unspecified: Secondary | ICD-10-CM | POA: Diagnosis not present

## 2022-08-03 DIAGNOSIS — O0990 Supervision of high risk pregnancy, unspecified, unspecified trimester: Secondary | ICD-10-CM

## 2022-08-03 DIAGNOSIS — O409XX0 Polyhydramnios, unspecified trimester, not applicable or unspecified: Secondary | ICD-10-CM

## 2022-08-03 DIAGNOSIS — Z3A23 23 weeks gestation of pregnancy: Secondary | ICD-10-CM

## 2022-08-03 NOTE — Progress Notes (Signed)
   PRENATAL VISIT NOTE  Subjective:  Kristine Brown is a 32 y.o. [id number] at 82w3dbeing seen today for ongoing prenatal care.  She is currently monitored for the following issues for this high-risk pregnancy and has Anxiety; Asymptomatic HIV infection ([place]; Depression; Supervision of high risk pregnancy, antepartum; Nausea/vomiting in pregnancy; Alpha thalassemia silent carrier; Maternal infection with human immunodeficiency virus in second trimester; Polyhydramnios affecting pregnancy; and Abnormal glucose affecting pregnancy on their problem list.  Patient doing well with no acute concerns today. She reports no complaints.  Contractions: Not present. Vag. Bleeding: None.  Movement: Present. Denies leaking of fluid.   The following portions of the patient's history were reviewed and updated as appropriate: allergies, current medications, past family history, past medical history, past social history, past surgical history and problem list. Problem list updated.  Objective:   Vitals:   08/03/22 1604  BP: 118/75  Pulse: 94  Weight: 165 lb 11.2 oz (75.2 kg)    Fetal Status: Fetal Heart Rate (bpm): 152 Fundal Height: 24 cm Movement: Present     General:  Alert, oriented and cooperative. Patient is in no acute distress.  Skin: Skin is warm and dry. No rash noted.   Cardiovascular: Normal heart rate noted  Respiratory: Normal respiratory effort, no problems with respiration noted  Abdomen: Soft, gravid, appropriate for gestational age.  Pain/Pressure: Present     Pelvic: Cervical exam deferred        Extremities: Normal range of motion.  Edema: Trace  Mental Status:  Normal mood and affect. Normal behavior. Normal judgment and thought content.   Assessment and Plan:  Pregnancy: [id number] 242w3d1. [redacted] weeks gestation of pregnancy   2. Supervision of high risk pregnancy, antepartum Continue routine prenatal care. Pt has some swelling/fluid involving the las knuckle of her right  thumb  3. Polyhydramnios affecting pregnancy Largest vertical pocket 10, has rescan on 08/07/22  4. Maternal infection with human immunodeficiency virus in second trimester Per notes viral load is undetectable, pt compliant with meds  5. Alpha thalassemia silent carrier   Preterm labor symptoms and general obstetric precautions including but not limited to vaginal bleeding, contractions, leaking of fluid and fetal movement were reviewed in detail with the patient.  Please refer to After Visit Summary for other counseling recommendations.   Return in about 4 weeks (around 08/31/2022) [facility] G[facility], 3rd trim labs.   LaLynnda ShieldsMD Faculty Attending Center for [facility]

## 2022-08-04 ENCOUNTER — Other Ambulatory Visit: Payer: Self-pay

## 2022-08-04 ENCOUNTER — Encounter (HOSPITAL_COMMUNITY)
Admission: AD | Disposition: A | Discharge status: Home / Self Care | Payer: Self-pay | Source: Home / Self Care | Attending: Family Medicine

## 2022-08-04 ENCOUNTER — Inpatient Hospital Stay (HOSPITAL_COMMUNITY): Payer: Medicaid Other | Admitting: Anesthesiology

## 2022-08-04 ENCOUNTER — Encounter (HOSPITAL_COMMUNITY): Payer: Self-pay | Admitting: Obstetrics & Gynecology

## 2022-08-04 ENCOUNTER — Telehealth: Payer: Self-pay

## 2022-08-04 ENCOUNTER — Other Ambulatory Visit (HOSPITAL_COMMUNITY): Payer: Self-pay

## 2022-08-04 ENCOUNTER — Inpatient Hospital Stay (HOSPITAL_COMMUNITY)
Admission: AD | Admit: 2022-08-04 | Discharge: 2022-08-07 | DRG: 786 | Disposition: A | Discharge status: Home / Self Care | Payer: Medicaid Other | Attending: Family Medicine | Admitting: Family Medicine

## 2022-08-04 ENCOUNTER — Telehealth: Payer: Medicaid Other | Admitting: Physician Assistant

## 2022-08-04 DIAGNOSIS — O42912 Preterm premature rupture of membranes, unspecified as to length of time between rupture and onset of labor, second trimester: Principal | ICD-10-CM | POA: Diagnosis present

## 2022-08-04 DIAGNOSIS — O9952 Diseases of the respiratory system complicating childbirth: Secondary | ICD-10-CM | POA: Diagnosis not present

## 2022-08-04 DIAGNOSIS — Z3A23 23 weeks gestation of pregnancy: Secondary | ICD-10-CM

## 2022-08-04 DIAGNOSIS — O4593 Premature separation of placenta, unspecified, third trimester: Secondary | ICD-10-CM

## 2022-08-04 DIAGNOSIS — Z148 Genetic carrier of other disease: Secondary | ICD-10-CM

## 2022-08-04 DIAGNOSIS — O26899 Other specified pregnancy related conditions, unspecified trimester: Secondary | ICD-10-CM

## 2022-08-04 DIAGNOSIS — F419 Anxiety disorder, unspecified: Secondary | ICD-10-CM | POA: Diagnosis present

## 2022-08-04 DIAGNOSIS — O9872 Human immunodeficiency virus [HIV] disease complicating childbirth: Secondary | ICD-10-CM | POA: Diagnosis present

## 2022-08-04 DIAGNOSIS — Z21 Asymptomatic human immunodeficiency virus [HIV] infection status: Secondary | ICD-10-CM | POA: Diagnosis present

## 2022-08-04 DIAGNOSIS — O403XX1 Polyhydramnios, third trimester, fetus 1: Secondary | ICD-10-CM

## 2022-08-04 DIAGNOSIS — F418 Other specified anxiety disorders: Secondary | ICD-10-CM | POA: Diagnosis not present

## 2022-08-04 DIAGNOSIS — O4592 Premature separation of placenta, unspecified, second trimester: Secondary | ICD-10-CM | POA: Diagnosis present

## 2022-08-04 DIAGNOSIS — O42013 Preterm premature rupture of membranes, onset of labor within 24 hours of rupture, third trimester: Secondary | ICD-10-CM

## 2022-08-04 DIAGNOSIS — J45909 Unspecified asthma, uncomplicated: Secondary | ICD-10-CM

## 2022-08-04 DIAGNOSIS — O4590 Premature separation of placenta, unspecified, unspecified trimester: Secondary | ICD-10-CM | POA: Insufficient documentation

## 2022-08-04 DIAGNOSIS — R198 Other specified symptoms and signs involving the digestive system and abdomen: Secondary | ICD-10-CM

## 2022-08-04 DIAGNOSIS — O0990 Supervision of high risk pregnancy, unspecified, unspecified trimester: Principal | ICD-10-CM

## 2022-08-04 DIAGNOSIS — O402XX0 Polyhydramnios, second trimester, not applicable or unspecified: Secondary | ICD-10-CM | POA: Diagnosis present

## 2022-08-04 DIAGNOSIS — O99344 Other mental disorders complicating childbirth: Secondary | ICD-10-CM | POA: Diagnosis present

## 2022-08-04 DIAGNOSIS — O98712 Human immunodeficiency virus [HIV] disease complicating pregnancy, second trimester: Secondary | ICD-10-CM | POA: Diagnosis present

## 2022-08-04 DIAGNOSIS — O409XX0 Polyhydramnios, unspecified trimester, not applicable or unspecified: Secondary | ICD-10-CM | POA: Diagnosis present

## 2022-08-04 DIAGNOSIS — Z87891 Personal history of nicotine dependence: Secondary | ICD-10-CM

## 2022-08-04 DIAGNOSIS — D563 Thalassemia minor: Secondary | ICD-10-CM | POA: Diagnosis present

## 2022-08-04 LAB — TYPE AND SCREEN
ABO/RH(D): O POS
Antibody Screen: NEGATIVE

## 2022-08-04 LAB — CBC
HCT: 31.5 % — ABNORMAL LOW (ref 36.0–46.0)
Hemoglobin: 10.8 g/dL — ABNORMAL LOW (ref 12.0–15.0)
MCH: 32 pg (ref 26.0–34.0)
MCHC: 34.3 g/dL (ref 30.0–36.0)
MCV: 93.5 fL (ref 80.0–100.0)
Platelets: 301 10*3/uL (ref 150–400)
RBC: 3.37 MIL/uL — ABNORMAL LOW (ref 3.87–5.11)
RDW: 13.6 % (ref 11.5–15.5)
WBC: 12.3 10*3/uL — ABNORMAL HIGH (ref 4.0–10.5)
nRBC: 0 % (ref 0.0–0.2)

## 2022-08-04 SURGERY — Surgical Case
Anesthesia: General

## 2022-08-04 MED ORDER — IBUPROFEN 600 MG PO TABS
600.0000 mg | ORAL_TABLET | Freq: Four times a day (QID) | ORAL | Status: DC
  Administered 2022-08-05 – 2022-08-07 (×5): 600 mg via ORAL
  Filled 2022-08-04 (×5): qty 1

## 2022-08-04 MED ORDER — SENNOSIDES-DOCUSATE SODIUM 8.6-50 MG PO TABS
2.0000 | ORAL_TABLET | Freq: Every day | ORAL | Status: DC
  Administered 2022-08-05 – 2022-08-06 (×2): 2 via ORAL
  Filled 2022-08-04 (×2): qty 2

## 2022-08-04 MED ORDER — CEFAZOLIN SODIUM-DEXTROSE 2-3 GM-%(50ML) IV SOLR
INTRAVENOUS | Status: DC | PRN
  Administered 2022-08-04: 2 g via INTRAVENOUS

## 2022-08-04 MED ORDER — KETOROLAC TROMETHAMINE 30 MG/ML IJ SOLN
INTRAMUSCULAR | Status: AC
  Filled 2022-08-04: qty 1

## 2022-08-04 MED ORDER — MIDAZOLAM HCL 2 MG/2ML IJ SOLN
INTRAMUSCULAR | Status: AC
  Filled 2022-08-04: qty 2

## 2022-08-04 MED ORDER — COCONUT OIL OIL
1.0000 | TOPICAL_OIL | Status: DC | PRN
  Administered 2022-08-05: 1 via TOPICAL

## 2022-08-04 MED ORDER — HYDROMORPHONE HCL 1 MG/ML IJ SOLN
0.2500 mg | INTRAMUSCULAR | Status: DC | PRN
  Administered 2022-08-04 (×2): 0.25 mg via INTRAVENOUS

## 2022-08-04 MED ORDER — ENOXAPARIN SODIUM 40 MG/0.4ML IJ SOSY
40.0000 mg | PREFILLED_SYRINGE | INTRAMUSCULAR | Status: DC
  Filled 2022-08-04 (×2): qty 0.4

## 2022-08-04 MED ORDER — PROPOFOL 10 MG/ML IV BOLUS
INTRAVENOUS | Status: DC | PRN
  Administered 2022-08-04: 180 mg via INTRAVENOUS

## 2022-08-04 MED ORDER — HYDROMORPHONE HCL 1 MG/ML IJ SOLN
INTRAMUSCULAR | Status: DC | PRN
  Administered 2022-08-04 (×2): 1 mg via INTRAVENOUS

## 2022-08-04 MED ORDER — WITCH HAZEL-GLYCERIN EX PADS: 1.0000 | MEDICATED_PAD | CUTANEOUS | Status: DC | PRN

## 2022-08-04 MED ORDER — OXYTOCIN-SODIUM CHLORIDE 30-0.9 UT/500ML-% IV SOLN
INTRAVENOUS | Status: DC | PRN
  Administered 2022-08-04: 300 mL via INTRAVENOUS

## 2022-08-04 MED ORDER — OXYCODONE HCL 5 MG/5ML PO SOLN: 5.0000 mg | Freq: Once | ORAL | Status: DC | PRN

## 2022-08-04 MED ORDER — SUCCINYLCHOLINE CHLORIDE 200 MG/10ML IV SOSY
PREFILLED_SYRINGE | INTRAVENOUS | Status: DC | PRN
  Administered 2022-08-04: 120 mg via INTRAVENOUS

## 2022-08-04 MED ORDER — PRENATAL MULTIVITAMIN CH
1.0000 | ORAL_TABLET | Freq: Every day | ORAL | Status: DC
  Administered 2022-08-05 – 2022-08-07 (×3): 1 via ORAL
  Filled 2022-08-04 (×3): qty 1

## 2022-08-04 MED ORDER — FENTANYL CITRATE (PF) 250 MCG/5ML IJ SOLN
INTRAMUSCULAR | Status: AC
  Filled 2022-08-04: qty 5

## 2022-08-04 MED ORDER — OXYCODONE HCL 5 MG PO TABS: 5.0000 mg | ORAL_TABLET | Freq: Once | ORAL | Status: DC | PRN

## 2022-08-04 MED ORDER — HYDROMORPHONE HCL 1 MG/ML IJ SOLN
INTRAMUSCULAR | Status: AC
  Filled 2022-08-04: qty 0.5

## 2022-08-04 MED ORDER — ONDANSETRON HCL 4 MG/2ML IJ SOLN
INTRAMUSCULAR | Status: DC | PRN
  Administered 2022-08-04: 4 mg via INTRAVENOUS

## 2022-08-04 MED ORDER — DEXAMETHASONE SODIUM PHOSPHATE 10 MG/ML IJ SOLN
INTRAMUSCULAR | Status: AC
  Filled 2022-08-04: qty 1

## 2022-08-04 MED ORDER — HYDROMORPHONE HCL 1 MG/ML IJ SOLN
INTRAMUSCULAR | Status: AC
  Filled 2022-08-04: qty 2

## 2022-08-04 MED ORDER — PROMETHAZINE HCL 25 MG/ML IJ SOLN: 6.2500 mg | INTRAMUSCULAR | Status: DC | PRN

## 2022-08-04 MED ORDER — DIPHENHYDRAMINE HCL 25 MG PO CAPS: 25.0000 mg | ORAL_CAPSULE | Freq: Four times a day (QID) | ORAL | Status: DC | PRN

## 2022-08-04 MED ORDER — DOLUTEGRAVIR SODIUM 50 MG PO TABS
50.0000 mg | ORAL_TABLET | Freq: Every day | ORAL | Status: DC
  Administered 2022-08-05 – 2022-08-07 (×3): 50 mg via ORAL
  Filled 2022-08-04 (×3): qty 1

## 2022-08-04 MED ORDER — OXYCODONE HCL 5 MG PO TABS
5.0000 mg | ORAL_TABLET | ORAL | Status: DC | PRN
  Administered 2022-08-05 (×2): 10 mg via ORAL
  Administered 2022-08-05: 5 mg via ORAL
  Administered 2022-08-05: 10 mg via ORAL
  Administered 2022-08-06: 5 mg via ORAL
  Administered 2022-08-06 – 2022-08-07 (×7): 10 mg via ORAL
  Filled 2022-08-04: qty 1
  Filled 2022-08-04 (×5): qty 2
  Filled 2022-08-04: qty 1
  Filled 2022-08-04 (×5): qty 2

## 2022-08-04 MED ORDER — ACETAMINOPHEN 500 MG PO TABS
1000.0000 mg | ORAL_TABLET | Freq: Four times a day (QID) | ORAL | Status: DC
  Administered 2022-08-04 – 2022-08-07 (×9): 1000 mg via ORAL
  Filled 2022-08-04 (×9): qty 2

## 2022-08-04 MED ORDER — SODIUM CHLORIDE 0.9 % IR SOLN
Status: DC | PRN
  Administered 2022-08-04: 1000 mL

## 2022-08-04 MED ORDER — DEXAMETHASONE SODIUM PHOSPHATE 10 MG/ML IJ SOLN
INTRAMUSCULAR | Status: DC | PRN
  Administered 2022-08-04: 10 mg via INTRAVENOUS

## 2022-08-04 MED ORDER — KETOROLAC TROMETHAMINE 30 MG/ML IJ SOLN
30.0000 mg | Freq: Once | INTRAMUSCULAR | Status: AC | PRN
  Administered 2022-08-04: 30 mg via INTRAVENOUS

## 2022-08-04 MED ORDER — SIMETHICONE 80 MG PO CHEW
80.0000 mg | CHEWABLE_TABLET | Freq: Three times a day (TID) | ORAL | Status: DC
  Administered 2022-08-04 – 2022-08-07 (×5): 80 mg via ORAL
  Filled 2022-08-04 (×7): qty 1

## 2022-08-04 MED ORDER — SCOPOLAMINE 1 MG/3DAYS TD PT72
MEDICATED_PATCH | TRANSDERMAL | Status: DC | PRN
  Administered 2022-08-04: 1 via TRANSDERMAL

## 2022-08-04 MED ORDER — FENTANYL CITRATE (PF) 100 MCG/2ML IJ SOLN
INTRAMUSCULAR | Status: DC | PRN
  Administered 2022-08-04: 200 ug via INTRAVENOUS
  Administered 2022-08-04: 50 ug via INTRAVENOUS

## 2022-08-04 MED ORDER — MENTHOL 3 MG MT LOZG
1.0000 | LOZENGE | OROMUCOSAL | Status: DC | PRN
  Administered 2022-08-05: 3 mg via ORAL
  Filled 2022-08-04: qty 9

## 2022-08-04 MED ORDER — OXYTOCIN-SODIUM CHLORIDE 30-0.9 UT/500ML-% IV SOLN: 2.5000 [IU]/h | INTRAVENOUS | Status: AC

## 2022-08-04 MED ORDER — LIDOCAINE HCL (CARDIAC) PF 100 MG/5ML IV SOSY
PREFILLED_SYRINGE | INTRAVENOUS | Status: DC | PRN
  Administered 2022-08-04: 80 mg via INTRAVENOUS

## 2022-08-04 MED ORDER — MIDAZOLAM HCL 2 MG/2ML IJ SOLN
INTRAMUSCULAR | Status: DC | PRN
  Administered 2022-08-04: 2 mg via INTRAVENOUS

## 2022-08-04 MED ORDER — SIMETHICONE 80 MG PO CHEW: 80.0000 mg | CHEWABLE_TABLET | ORAL | Status: DC | PRN

## 2022-08-04 MED ORDER — ACETAMINOPHEN 10 MG/ML IV SOLN
INTRAVENOUS | Status: AC
  Filled 2022-08-04: qty 100

## 2022-08-04 MED ORDER — LACTATED RINGERS IV SOLN: INTRAVENOUS | Status: DC | PRN

## 2022-08-04 MED ORDER — STERILE WATER FOR IRRIGATION IR SOLN
Status: DC | PRN
  Administered 2022-08-04: 1000 mL

## 2022-08-04 MED ORDER — SODIUM CHLORIDE 0.9 % IV SOLN
500.0000 mg | Freq: Once | INTRAVENOUS | Status: DC
  Administered 2022-08-04: 500 mg via INTRAVENOUS
  Filled 2022-08-04: qty 5

## 2022-08-04 MED ORDER — SCOPOLAMINE 1 MG/3DAYS TD PT72
MEDICATED_PATCH | TRANSDERMAL | Status: AC
  Filled 2022-08-04: qty 1

## 2022-08-04 MED ORDER — KETOROLAC TROMETHAMINE 30 MG/ML IJ SOLN
30.0000 mg | Freq: Four times a day (QID) | INTRAMUSCULAR | Status: AC
  Administered 2022-08-04 – 2022-08-05 (×4): 30 mg via INTRAVENOUS
  Filled 2022-08-04 (×4): qty 1

## 2022-08-04 MED ORDER — ZOLPIDEM TARTRATE 5 MG PO TABS: 5.0000 mg | ORAL_TABLET | Freq: Every evening | ORAL | Status: DC | PRN

## 2022-08-04 MED ORDER — EMTRICITABINE-TENOFOVIR AF 200-25 MG PO TABS
1.0000 | ORAL_TABLET | Freq: Every day | ORAL | Status: DC
  Administered 2022-08-05 – 2022-08-07 (×3): 1 via ORAL
  Filled 2022-08-04 (×3): qty 1

## 2022-08-04 MED ORDER — ACETAMINOPHEN 10 MG/ML IV SOLN
INTRAVENOUS | Status: DC | PRN
  Administered 2022-08-04: 1000 mg via INTRAVENOUS

## 2022-08-04 MED ORDER — ONDANSETRON HCL 4 MG/2ML IJ SOLN
INTRAMUSCULAR | Status: AC
  Filled 2022-08-04: qty 2

## 2022-08-04 MED ORDER — OXYTOCIN-SODIUM CHLORIDE 30-0.9 UT/500ML-% IV SOLN: INTRAVENOUS | Status: DC | PRN

## 2022-08-04 MED ORDER — DIBUCAINE (PERIANAL) 1 % EX OINT: 1.0000 | TOPICAL_OINTMENT | CUTANEOUS | Status: DC | PRN

## 2022-08-04 SURGICAL SUPPLY — 32 items
ADH SKN CLS APL DERMABOND .7 (GAUZE/BANDAGES/DRESSINGS) ×2
APL PRP STRL LF DISP 70% ISPRP (MISCELLANEOUS) ×2
CHLORAPREP W/TINT 26 (MISCELLANEOUS) ×2 IMPLANT
CLAMP UMBILICAL CORD (MISCELLANEOUS) ×1 IMPLANT
CLOTH BEACON ORANGE TIMEOUT ST (SAFETY) ×1 IMPLANT
DERMABOND ADVANCED .7 DNX12 (GAUZE/BANDAGES/DRESSINGS) ×2 IMPLANT
DRSG OPSITE POSTOP 4X10 (GAUZE/BANDAGES/DRESSINGS) ×1 IMPLANT
ELECT REM PT RETURN 9FT ADLT (ELECTROSURGICAL) ×1
ELECTRODE REM PT RTRN 9FT ADLT (ELECTROSURGICAL) ×1 IMPLANT
EXTRACTOR VACUUM M CUP 4 TUBE (SUCTIONS) IMPLANT
GAUZE SPONGE 4X4 12PLY STRL LF (GAUZE/BANDAGES/DRESSINGS) IMPLANT
GLOVE BIOGEL PI IND STRL 7.0 (GLOVE) ×2 IMPLANT
GLOVE BIOGEL PI IND STRL 7.5 (GLOVE) ×2 IMPLANT
GLOVE ECLIPSE 7.5 STRL STRAW (GLOVE) ×1 IMPLANT
GOWN STRL REUS W/TWL LRG LVL3 (GOWN DISPOSABLE) ×3 IMPLANT
KIT ABG SYR 3ML LUER SLIP (SYRINGE) IMPLANT
NDL HYPO 25X5/8 SAFETYGLIDE (NEEDLE) IMPLANT
NEEDLE HYPO 25X5/8 SAFETYGLIDE (NEEDLE) ×1 IMPLANT
NS IRRIG 1000ML POUR BTL (IV SOLUTION) ×1 IMPLANT
PACK C SECTION WH (CUSTOM PROCEDURE TRAY) ×1 IMPLANT
PAD OB MATERNITY 4.3X12.25 (PERSONAL CARE ITEMS) ×1 IMPLANT
RTRCTR C-SECT PINK 25CM LRG (MISCELLANEOUS) ×1 IMPLANT
SUT MNCRL 0 VIOLET CTX 36 (SUTURE) ×2 IMPLANT
SUT MONOCRYL 0 CTX 36 (SUTURE) ×3
SUT VIC AB 0 CTX 36 (SUTURE) ×1
SUT VIC AB 0 CTX36XBRD ANBCTRL (SUTURE) ×1 IMPLANT
SUT VIC AB 2-0 CT1 27 (SUTURE) ×1
SUT VIC AB 2-0 CT1 TAPERPNT 27 (SUTURE) ×1 IMPLANT
SUT VIC AB 4-0 KS 27 (SUTURE) ×1 IMPLANT
TOWEL OR 17X24 6PK STRL BLUE (TOWEL DISPOSABLE) ×1 IMPLANT
TRAY FOLEY W/BAG SLVR 14FR LF (SET/KITS/TRAYS/PACK) ×1 IMPLANT
WATER STERILE IRR 1000ML POUR (IV SOLUTION) ×1 IMPLANT

## 2022-08-04 NOTE — MAU Note (Addendum)
...  Kristine Brown is a 32 y.o. at 87w4dhere in MAU reporting: Patient arrived to MAU via EMS with a complaint of heavy vaginal bleeding and rupture of membranes.   Upon arrival to MAU, patient brought to room 1S20. Patient's sheet and dress were saturated with bright red vaginal blood. Patient had IV access established by EMS [facility] bol[facility] attached to line immediately.  CLen Blalock CNM, SMaryelizabeth Kaufmann CNM, and KLubertha Sayres MD at bedside upon patient arrival. [facility], also at bedside.  NMaryruth Hancock CNM, and [place] performed immediate bedside U/S. KLubertha Sayres MD, performed vaginal exam. Patient found to be 2 cm.   FHR observed by bedside U/S in the 140's.   NReather Converse RN, established 2nd IV access in left anterior forearm.  KLubertha Sayres MD, gave verbal order for this RN to call a Code Cesarean.  Code Cesarean called immediately at 1407.

## 2022-08-04 NOTE — Discharge Summary (Signed)
Postpartum Discharge Summary  Date of Service updated: 08/07/22     Patient Name: Kristine Brown DOB: 1991/03/11 MRN: [id number]  Date of admission: 08/04/2022 Delivery date:08/04/2022  Delivering provider: Truett Mainland  Date of discharge: 08/07/2022  Admitting diagnosis: Cesarean delivery delivered [O82] Intrauterine pregnancy: [redacted]w[redacted]d     Secondary diagnosis:  Principal Problem:   Cesarean delivery delivered Active Problems:   Anxiety   Asymptomatic HIV infection ([place])   Supervision of high risk pregnancy, antepartum   Alpha thalassemia silent carrier   Maternal infection with human immunodeficiency virus in second trimester   Polyhydramnios affecting pregnancy   Placental abruption   Delivery by classical cesarean section   Preterm delivery  Additional problems: n/a    Discharge diagnosis: Preterm Pregnancy Delivered                                              Post partum procedures: none Augmentation: N/A Complications: Placental Abruption  Hospital course: Onset of Labor With Unplanned C/S   32 y.o. yo [id number] at [redacted]w[redacted]d was admitted having a placental abruption on 08/04/2022. Patient had a labor course significant for patient started bleeding at home and arrives via EMS with signs concerning for placental abruption.. The patient went for cesarean section due to  placental abruption . Delivery details as follows: Membrane Rupture Time/Date: 2:18 PM ,08/04/2022   Delivery Method:C-Section, Classical  Details of operation can be found in separate operative note. Patient had an uncomplicated postpartum course .  She is ambulating,tolerating a regular diet, passing flatus, and urinating well.  Patient is discharged home in stable condition 08/07/22.  Newborn Data: Birth date:08/04/2022  Birth time:2:18 PM  Gender:Female  Living status:Living  Apgars:2 ,7  Weight:780 g   Magnesium Sulfate received: No BMZ received: No Rhophylac:No MMR:No T-DaP: unknown Flu:  No Transfusion:No  Physical exam  Vitals:   08/06/22 1845 08/06/22 1942 08/07/22 0017 08/07/22 0824  BP:  121/71 114/72 121/66  Pulse:  79 72 81  Resp:   18 18  Temp:  98 F (36.7 C)  98.1 F (36.7 C)  TempSrc:  Oral  Oral  SpO2: 100%   99%   General: alert, cooperative, and no distress Lochia: appropriate Uterine Fundus: firm Incision: Dressing is clean, dry, and intact DVT Evaluation: No evidence of DVT seen on physical exam. Negative Homan's sign. Labs: Lab Results  Component Value Date   WBC 21.2 (H) 08/05/2022   HGB 9.5 (L) 08/05/2022   HCT 28.3 (L) 08/05/2022   MCV 94.6 08/05/2022   PLT 294 08/05/2022      Latest Ref Rng & Units 06/01/2022   10:34 PM  CMP  Glucose 70 - 99 mg/dL 92   BUN 6 - 20 mg/dL 7   Creatinine 0.44 - 1.00 mg/dL 0.65   Sodium 135 - 145 mmol/L 131   Potassium 3.5 - 5.1 mmol/L 3.5   Chloride 98 - 111 mmol/L 104   CO2 22 - 32 mmol/L 20   Calcium 8.9 - 10.3 mg/dL 9.3   Total Protein 6.5 - 8.1 g/dL 6.5   Total Bilirubin 0.3 - 1.2 mg/dL 0.3   Alkaline Phos 38 - 126 U/L 43   AST 15 - 41 U/L 20   ALT 0 - 44 U/L 15    Edinburgh Score:     No data to display  After visit meds:  Allergies as of 08/07/2022   No Known Allergies      Medication List     STOP taking these medications    Blood Pressure Monitoring Devi   Gojji Weight Scale Misc   hydrOXYzine 25 MG capsule Commonly known as: Vistaril   multivitamin-prenatal 27-0.8 MG Tabs tablet   [place] Tabs   ondansetron 8 MG disintegrating tablet Commonly known as: ZOFRAN-ODT   ondansetron 8 MG tablet Commonly known as: Zofran   Oscimin 0.125 MG Subl Generic drug: Hyoscyamine Sulfate SL   potassium chloride 10 MEQ tablet Commonly known as: KLOR-CON M   promethazine 25 MG suppository Commonly known as: PHENERGAN   promethazine 25 MG tablet Commonly known as: PHENERGAN   Transderm-Scop 1 MG/3DAYS Generic drug: scopolamine       TAKE these  medications    acetaminophen 500 MG tablet Commonly known as: TYLENOL Take 2 tablets (1,000 mg total) by mouth every 6 (six) hours as needed.   albuterol 108 (90 Base) MCG/ACT inhaler Commonly known as: VENTOLIN HFA Inhale into the lungs.   Descovy 200-25 MG tablet Generic drug: emtricitabine-tenofovir AF Take 1 tablet by mouth daily.   ferrous sulfate 325 (65 FE) MG tablet Take 1 tablet (325 mg total) by mouth every other day.   ibuprofen 600 MG tablet Commonly known as: ADVIL Take 1 tablet (600 mg total) by mouth every 6 (six) hours.   oxyCODONE 5 MG immediate release tablet Commonly known as: Oxy IR/ROXICODONE Take 1 tablet (5 mg total) by mouth every 4 (four) hours as needed for moderate pain.   Tivicay 50 MG tablet Generic drug: dolutegravir Take 1 tablet (50 mg total) by mouth daily.         Discharge home in stable condition Infant Feeding: Breast Infant Disposition:NICU Discharge instruction: per After Visit Summary and Postpartum booklet. Activity: Advance as tolerated. Pelvic rest for 6 weeks.  Diet: routine diet Future Appointments: Future Appointments  Date Time Provider Comfort  08/12/2022 10:20 AM [facility] NURSE [facility]  09/14/2022  1:15 PM Woodroe Mode, MD [facility]   Follow up Visit:  [place] for [place] at [facility]. Schedule an appointment as soon as possible for a visit in 1 week(s).   Specialty: Obstetrics and Gynecology Why: wound check Contact information: [address] [place] [address] [place] for [place] at [facility] Follow up in 5 week(s).   Specialty: Obstetrics and Gynecology Why: postpartum appointment Contact information: [place] [address] [phone]               Message sent   Please schedule this patient for a In person  postpartum visit in 6 weeks with the following provider: Any provider. Additional Postpartum F/U:Incision check 1 week  High risk pregnancy complicated by:  HIV infection Delivery mode:  C-Section, Classical  Anticipated Birth Control:  Unsure   08/07/2022 Griffin Basil, MD

## 2022-08-04 NOTE — Transfer of Care (Signed)
Immediate Anesthesia Transfer of Care Note  Patient: Kristine Brown  Procedure(s) Performed: CESAREAN SECTION  Patient Location: PACU  Anesthesia Type:General  Level of Consciousness: awake, alert , and oriented  Airway & Oxygen Therapy: Patient Spontanous Breathing  Post-op Assessment: Report given to RN and Post -op Vital signs reviewed and stable  Post vital signs: Reviewed and stable  Last Vitals:  Vitals Value Taken Time  BP 132/92   Temp    Pulse 89   Resp 16   SpO2 100     Last Pain:  Vitals:   08/04/22 1522  PainSc: 5          Complications: No notable events documented.

## 2022-08-04 NOTE — MAU Provider Note (Signed)
CNM met patient at [place]. EMS notified team that patient had SROM at approximately 1330 and was coming for evaluation. Approximately 10 minutes prior to arrival, EMS notified MAU that patient was now hemorrhaging and feeling pressure.   Patient states she had been feeling some pressure and cramping earlier today before her water broke. She reports that improved after SROM. She denies any obstetric complications in the pregnancy. She reports she has HIV with an undetectable viral load and 2 previous vaginal deliveries at term.   Upon arrival to the unit, large amount of bright red bleeding with clots on stretcher and coming from introitus. Dr. Ernestina Patches at bedside for exam- cervix 2cm dilated with large clots in vagina. Bedside ultrasound performed and confirmed FHR 143 bpm.   Dr. Ernestina Patches called CODE CESAREAN and Dr. Nehemiah Settle at bedside to consent patient for emergency c/s.   Wende Mott, CNM 08/04/22 2:27 PM

## 2022-08-04 NOTE — Progress Notes (Signed)
I was handed the patient's necklace, in the OR. I put gold heart pendant and yellow chain into a small plastic container.  I placed it  with patient's phone together with a small plastic bag containing rings and bracelet into a bio-hazard bag. The patient's clothes, which are soiled with blood, are in a patient belongings bag. The patient belongings bag and the bio- hazard bag were placed on the tray table in the PACU bay. They were seen there when the patient was rolled into the PACU bay for recovery care.   When the patient's mother visited in the PACU, I attempted to hand them to the patient's mother for safe keeping.  The mother stated, she will get them as soon as she gets back and left the PACU.  Later when the patient was admitted to [facility], she asked for her jewelry.  She had her phone with her.   We asked her to call her mother, who was at home with the patient's children.   Sharman Crate, checked with the patient at 6 pm.  The patient reported she had spoken to her mother and her mother has her clothes and her jewelry.

## 2022-08-04 NOTE — H&P (Addendum)
Faculty Practice H&P  Kristine Brown is a 32 y.o. female [phone] with IUP at 16w4dbrought to the hospital via EMS due to PPROM with contractions at home with profuse vaginal bleeding in route to the hospital. On exam in the MAU, patient had large amount of clot and was found to be 2cm dilated. Bedside UKoreadone, showing FHR of 140.   Pregnancy complicated by HIV on antiviral medications with an undetectable viral load last done in 12/23.      Prenatal Course Source of Care: [facility] with onset of care at 10 weeks  Pregnancy complications or risks: Patient Active Problem List   Diagnosis Date Noted   Maternal infection with human immunodeficiency virus in second trimester 07/13/2022   Polyhydramnios affecting pregnancy 07/13/2022   Abnormal glucose affecting pregnancy 07/13/2022   Alpha thalassemia silent carrier 06/02/2022   Nausea/vomiting in pregnancy 05/14/2022   Supervision of high risk pregnancy, antepartum 04/29/2022   Depression 11/17/2019   Anxiety 05/12/2018   Asymptomatic HIV infection ([place] 05/12/2018   She desires no method for contraception (same sex partner) She plans to breastfeed  Prenatal labs and studies: ABO, Rh: O/Positive/-- (12/14 1552) Antibody: Negative (12/14 1552) Rubella: 1.32 (12/14 1552) RPR: Non Reactive (12/14 1552)  HBsAg: Negative (12/14 1552)  HIV: Preliminary Reactive (12/14 1552)  GBS:    2hr Glucola: negative Genetic screening: normal Anatomy UKorea normal  Past Medical History:  Past Medical History:  Diagnosis Date   Anxiety    Asthma    Depression    HIV (human immunodeficiency virus infection) ([place]     Past Surgical History:  Past Surgical History:  Procedure Laterality Date   NO PAST SURGERIES      Obstetrical History:  OB History     Gravida  10   Para  3   Term  2   Preterm  1   AB  7   Living  3      SAB  6   IAB  1   Ectopic      Multiple  0   Live Births  3           Gynecological  History:  OB History     Gravida  10   Para  3   Term  2   Preterm  1   AB  7   Living  3      SAB  6   IAB  1   Ectopic      Multiple  0   Live Births  3           Social History:  Social History   Socioeconomic History   Marital status: Single    Spouse name: Not on file   Number of children: Not on file   Years of education: Not on file   Highest education level: Not on file  Occupational History   Not on file  Tobacco Use   Smoking status: Former    Types: Cigarettes   Smokeless tobacco: Never  Vaping Use   Vaping Use: Never used  Substance and Sexual Activity   Alcohol use: Never   Drug use: Not Currently    Types: Marijuana   Sexual activity: Yes    Birth control/protection: None    Comment: pt given condoms  Other Topics Concern   Not on file  Social History Narrative   Not on file   Social Determinants of Health   Financial Resource Strain: Not on  file  Food Insecurity: Not on file  Transportation Needs: Not on file  Physical Activity: Not on file  Stress: Not on file  Social Connections: Not on file    Family History:  Family History  Problem Relation Age of Onset   Healthy Mother    Cancer Father    COPD Father    Healthy Daughter    Healthy Son    Asthma Neg Hx    Diabetes Neg Hx    Hearing loss Neg Hx    Hyperlipidemia Neg Hx    Stroke Neg Hx     Medications:  Prenatal vitamins,  Current Facility-Administered Medications  Medication Dose Route Frequency Provider Last Rate Last Admin   sodium chloride irrigation 0.9 %    PRN Truett Mainland, DO   1,000 mL at 08/04/22 1439   sterile water for irrigation for irrigation    PRN Truett Mainland, DO   1,000 mL at 08/04/22 1439   Facility-Administered Medications Ordered in Other Encounters  Medication Dose Route Frequency Provider Last Rate Last Admin   acetaminophen (OFIRMEV) IV   Intravenous Anesthesia Intra-op Rhymer, Colleen S, CRNA   1,000 mg at 08/04/22 1439    ceFAZolin (ANCEF) IVPB 2 g/50 mL premix   Intravenous Anesthesia Intra-op Nilda Simmer, MD   2 g at 08/04/22 1417   dexamethasone (DECADRON) injection   Intravenous Anesthesia Intra-op Rhymer, Waynard Reeds, CRNA   10 mg at 08/04/22 1423   fentaNYL (SUBLIMAZE) injection   Intravenous Anesthesia Intra-op Rhymer, Waynard Reeds, CRNA   50 mcg at 08/04/22 1423   HYDROmorphone (DILAUDID) injection   Intravenous Anesthesia Intra-op Rhymer, Waynard Reeds, CRNA   1 mg at 08/04/22 1426   lactated ringers infusion   Intravenous Continuous PRN Rhymer, Waynard Reeds, CRNA   Stopped at 08/04/22 1450   lidocaine (cardiac) 100 mg/60m (XYLOCAINE) injection 2%   Intravenous Anesthesia Intra-op ANilda Simmer MD   80 mg at 08/04/22 1413   midazolam (VERSED) injection   Intravenous Anesthesia Intra-op ANilda Simmer MD   2 mg at 08/04/22 1420   ondansetron (ZOFRAN) injection   Intravenous Anesthesia Intra-op Rhymer, CWaynard Reeds CRNA   4 mg at 08/04/22 1426   oxytocin (PITOCIN) IV infusion 30 units in NS 500 mL - Premix   Intravenous Anesthesia Intra-op Rhymer, CWaynard Reeds CRNA   300 mL at 08/04/22 1419   propofol (DIPRIVAN) 10 mg/mL bolus/IV push   Intravenous Anesthesia Intra-op ANilda Simmer MD   180 mg at 08/04/22 1413   scopolamine (TRANSDERM-SCOP) 1 MG/3DAYS   Transdermal Anesthesia Intra-op Rhymer, CWaynard Reeds CRNA   1 patch at 08/04/22 1441   succinylcholine (ANECTINE) syringe   Intravenous Anesthesia Intra-op ANilda Simmer MD   120 mg at 08/04/22 1413    Allergies: No Known Allergies  Review of Systems: -  as in HPI  Physical Exam: Last menstrual period 02/27/2022, unknown if currently breastfeeding. GENERAL: Well-developed, well-nourished female in no acute distress.  LUNGS: Clear to auscultation bilaterally.  HEART: Regular rate and rhythm. ABDOMEN: Soft, nontender, nondistended, gravid to 23 weeks   Pertinent Labs/Studies:   Lab Results  Component Value  Date   WBC 12.0 (H) 06/01/2022   HGB 11.5 (L) 06/01/2022   HCT 32.5 (L) 06/01/2022   MCV 91.0 06/01/2022   PLT 322 06/01/2022    Assessment : SElvina Ranis a 32y.o. [id number] 292w4deing admitted for cesarean section secondary due to PPROM with placental abruption  remote from delivery.  Plan: The risks of cesarean section discussed with the patient included but were not limited to: bleeding which may require transfusion or reoperation; infection which may require antibiotics; injury to bowel, bladder, ureters or other surrounding organs; injury to the fetus; need for additional procedures including hysterectomy in the event of a life-threatening hemorrhage; placental abnormalities wth subsequent pregnancies, incisional problems, thromboembolic phenomenon and other postoperative/anesthesia complications. I also discussed possibility of vertical uterine incision, which would necessitate future cesarean sections.   Preoperative prophylactic Ancef ordered on call to the OR.   Patient just recently ate, but as this is an emergent situation, will proceed with emergency cesarean section with general anesthesia.  Due to emergency situation, this documentation occurred after the surgery.   Written consent was not obtained - patient was verbally consented.  Truett Mainland, DO 08/04/2022, 3:00 PM

## 2022-08-04 NOTE — Anesthesia Procedure Notes (Signed)
Procedure Name: Intubation Date/Time: 08/04/2022 2:14 PM  Performed by: Nilda Simmer, MDPre-anesthesia Checklist: Patient identified, Emergency Drugs available, Suction available and Patient being monitored Patient Re-evaluated:Patient Re-evaluated prior to induction Oxygen Delivery Method: Circle system utilized Preoxygenation: Pre-oxygenation with 100% oxygen Induction Type: IV induction and Rapid sequence Laryngoscope Size: Glidescope and 3 Grade View: Grade I Tube type: Oral Tube size: 7.0 mm Number of attempts: 1 Airway Equipment and Method: Stylet and Oral airway Placement Confirmation: ETT inserted through vocal cords under direct vision, positive ETCO2 and breath sounds checked- equal and bilateral Secured at: 21 cm Tube secured with: Tape Dental Injury: Teeth and Oropharynx as per pre-operative assessment

## 2022-08-04 NOTE — Anesthesia Preprocedure Evaluation (Signed)
Anesthesia Evaluation  Patient identified by MRN, date of birth, ID band Patient awake    Reviewed: Allergy & Precautions, NPO status , Patient's Chart, lab work & pertinent test results  History of Anesthesia Complications Negative for: history of anesthetic complications  Airway Mallampati: II       Dental  (+) Dental Advisory Given   Pulmonary asthma , former smoker   breath sounds clear to auscultation       Cardiovascular  Rhythm:Regular Rate:Normal     Neuro/Psych  PSYCHIATRIC DISORDERS Anxiety Depression       GI/Hepatic   Endo/Other    Renal/GU      Musculoskeletal   Abdominal   Peds  Hematology  (+) HIV  Anesthesia Other Findings   Reproductive/Obstetrics                              Anesthesia Physical Anesthesia Plan  ASA: 2 and emergent  Anesthesia Plan: General   Post-op Pain Management:    Induction: Intravenous and Rapid sequence  PONV Risk Score and Plan: 3 and Ondansetron, Dexamethasone and Treatment may vary due to age or medical condition  Airway Management Planned: Oral ETT and Video Laryngoscope Planned  Additional Equipment:   Intra-op Plan:   Post-operative Plan: Extubation in OR  Informed Consent: I have reviewed the patients History and Physical, chart, labs and discussed the procedure including the risks, benefits and alternatives for the proposed anesthesia with the patient or authorized representative who has indicated his/her understanding and acceptance.     Only emergency history available  Plan Discussed with: CRNA and Anesthesiologist  Anesthesia Plan Comments: (Patient rolled into OR for stat c-section. Pertinent history obtained. Quick verbal consent performed.)         Anesthesia Quick Evaluation

## 2022-08-04 NOTE — Anesthesia Postprocedure Evaluation (Signed)
Anesthesia Post Note  Patient: Kristine Brown  Procedure(s) Performed: CESAREAN SECTION     Patient location during evaluation: PACU Anesthesia Type: General Level of consciousness: awake Pain management: pain level controlled Vital Signs Assessment: post-procedure vital signs reviewed and stable Respiratory status: spontaneous breathing, nonlabored ventilation and respiratory function stable Cardiovascular status: blood pressure returned to baseline and stable Postop Assessment: no apparent nausea or vomiting Anesthetic complications: no   No notable events documented.  Last Vitals:  Vitals:   08/04/22 1601 08/04/22 1615  BP: 135/77 127/78  Pulse:    Temp: 36.9 C     Last Pain:  Vitals:   08/04/22 1601  TempSrc: Oral  PainSc:    Pain Goal:                   Nilda Simmer

## 2022-08-04 NOTE — Op Note (Cosign Needed Addendum)
Operative Note   Patient: Kristine Brown  Date of Procedure: 08/04/2022  Procedure: Primary Classical Cesarean Section    Indications: abruptio placenta  Pre-operative Diagnosis: abruption.   Post-operative Diagnosis: Same  TOLAC Candidate: No  Surgeon: Surgeon(s) and Role:    * Stinson, Tanna Savoy, DO - Primary    * Jowel Waltner, Angelyn Punt, MD - Fellow  Assistants: none  An experienced assistant was required given the standard of surgical care given the complexity of the case.  This assistant was needed for exposure, dissection, suctioning, retraction, instrument exchange, assisting with delivery with administration of fundal pressure, and for overall help during the procedure.   Anesthesia: general  Anesthesiologist: Nilda Simmer, MD   Antibiotics: Cefazolin   Estimated Blood Loss: 450 ml   Total IV Fluids: 1000 ml  Urine Output:  150 cc OF clear urine  Specimens: Placenta   Complications: no complications   Indications: Kristine Brown is a 32 y.o. [id number] with an IUP [redacted]w[redacted]d presenting for unscheduled, emergent cesarean secondary to the indications listed above. Clinical course notable for arrival to the MAU with considerable bleeding and abdominal pain.  Signs of placental abruption.  Patient emergently taken to the OR for cesarean delivery..  The risks of cesarean section discussed with the patient included but were not limited to: bleeding which may require transfusion or reoperation; infection which may require antibiotics; injury to bowel, bladder, ureters or other surrounding organs; injury to the fetus; need for additional procedures including hysterectomy in the event of a life-threatening hemorrhage; placental abnormalities with subsequent pregnancies, incisional problems, thromboembolic phenomenon and other postoperative/anesthesia complications. The patient concurred with the proposed plan, giving informed written consent for the procedure. Patient NPO  status waived given urgency of case. Anesthesia and OR aware. Preoperative prophylactic antibiotics and SCDs ordered on call to the OR.    Findings: Viable infant in cephalic presentation, no nuchal cord present. Apgars , , . Weight 780 g .  No amniotic fluid appreciated  amniotic fluid. Normal placenta, three vessel cord. Normal uterus, Normal bilateral fallopian tubes, Normal bilateral ovaries.  Procedure Details: A Time Out was held and the above information confirmed. The patient received intravenous antibiotics and had sequential compression devices applied to her lower extremities preoperatively. The patient was taken back to the operative suite where general anesthesia was administered. After induction of anesthesia, the patient was draped and prepped in the usual sterile manner and placed in a dorsal supine position with a leftward tilt. A low transverse skin incision was made with scalpel and carried down through the subcutaneous tissue to the fascia. Fascial incision was made and extended transversely. The fascia was separated from the underlying rectus tissue superiorly and inferiorly. The rectus muscles were separated in the midline bluntly and the peritoneum was entered bluntly. A Rich retractor and bladder blade were placed to aid in visualization of the uterus. A bladder flap was not developed. A classical uterine incision was made. The infant was successfully delivered from cephalic presentation, the umbilical cord was clamped immediately. Cord ph was sent, and cord blood was obtained for evaluation. The placenta was removed Intact and appeared normal.  An Alexis retractor was then placed.  The uterine incision was closed with 3 layers of running locked suture of 0-Monocryl. Overall, excellent hemostasis was noted. The abdomen and the pelvis were cleared of all clot and debris and the Ubaldo Glassing was removed. Hemostasis was confirmed on all surfaces.  The peritoneum was reapproximated using 2-0  vicryl . The  fascia was then closed using 0 Vicryl in a running fashion. The subcutaneous layer was not reapproximated. The skin was closed with a 4-0 vicryl subcuticular stitch. The patient tolerated the procedure well. Sponge, lap, instrument and needle counts were correct x 2. She was taken to the recovery room in stable condition.  Disposition: PACU - hemodynamically stable.    Signed: Concepcion Living, MD [facility] [place]

## 2022-08-04 NOTE — Telephone Encounter (Addendum)
VM left on nurse line at 10:44 AM stating "I don't feel so good today, I have a lot of pressure in my bottom" requesting call back regarding if she should go to hospital to be seen. Per chart review patient presented to MAU today following time of VM message.

## 2022-08-04 NOTE — Progress Notes (Signed)
Virtual Visit Consent   Kristine Brown, you are scheduled for a virtual visit with a [facility] provider today. Just as with appointments in the office, your consent must be obtained to participate. Your consent will be active for this visit and any virtual visit you may have with one of our providers in the next 365 days. If you have a MyChart account, a copy of this consent can be sent to you electronically.  As this is a virtual visit, video technology does not allow for your provider to perform a traditional examination. This may limit your provider's ability to fully assess your condition. If your provider identifies any concerns that need to be evaluated in person or the need to arrange testing (such as labs, EKG, etc.), we will make arrangements to do so. Although advances in technology are sophisticated, we cannot ensure that it will always work on either your end or our end. If the connection with a video visit is poor, the visit may have to be switched to a telephone visit. With either a video or telephone visit, we are not always able to ensure that we have a secure connection.  By engaging in this virtual visit, you consent to the provision of healthcare and authorize for your insurance to be billed (if applicable) for the services provided during this visit. Depending on your insurance coverage, you may receive a charge related to this service.  I need to obtain your verbal consent now. Are you willing to proceed with your visit today? [name] has provided verbal consent on 08/04/2022 for a virtual visit (video or telephone). Kristine Brown, [place]  Date: 08/04/2022 12:07 PM  Virtual Visit via Video Note   I, Kristine Brown, connected with  Dajanique Bailly  ([id number], 13-Jan-1991) on 08/04/22 at 12:00 PM EST by a video-enabled telemedicine application and verified that I am speaking with the correct person using two identifiers.  Location: Patient: Virtual Visit Location  Patient: Home Provider: Virtual Visit Location Provider: Home Office   I discussed the limitations of evaluation and management by telemedicine and the availability of in person appointments. The patient expressed understanding and agreed to proceed.    History of Present Illness: Kristine Brown is a 32 y.o. who identifies as a female who was assigned female at birth, and is being seen today for subsantial pelvic and rectal pressure with discomfort starting this morning. Denies change to bowel or bladder habits. Last BM was last night and normal per patient. Denies vaginal spotting or bleeding. She is 23+ weeks pregnant, last follow-up with OB yesterday. 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 07/13/2022   Polyhydramnios affecting pregnancy 07/13/2022   Abnormal glucose affecting pregnancy 07/13/2022   Alpha thalassemia silent carrier 06/02/2022   Nausea/vomiting in pregnancy 05/14/2022   Supervision of high risk pregnancy, antepartum 04/29/2022   Depression 11/17/2019   Anxiety 05/12/2018   Asymptomatic HIV infection ([place]) 05/12/2018    Allergies: No Known Allergies Medications:  Current Outpatient Medications:    acetaminophen (TYLENOL) 500 MG tablet, Take 2 tablets (1,000 mg total) by mouth every 6 (six) hours as needed., Disp: 100 tablet, Rfl: 2   albuterol (VENTOLIN HFA) 108 (90 Base) MCG/ACT inhaler, Inhale into the lungs., Disp: , Rfl:    Blood Pressure Monitoring DEVI, 1 each by Does not apply  route once a week., Disp: 1 each, Rfl: 0   dolutegravir (TIVICAY) 50 MG tablet, Take 1 tablet (50 mg total) by mouth daily., Disp: 30 tablet, Rfl: 5   emtricitabine-tenofovir AF (DESCOVY) 200-25 MG tablet, Take 1 tablet by mouth daily., Disp: 30 tablet, Rfl: 5   hydrOXYzine (VISTARIL) 25 MG capsule, Take 1 capsule (25 mg total) by  mouth 3 (three) times daily as needed. (Patient not taking: Reported on 05/14/2022), Disp: 30 capsule, Rfl: 0   Hyoscyamine Sulfate SL (LEVSIN/SL) 0.125 MG SUBL, Place 1 tablet under the tongue every 12 (twelve) hours as needed. (Patient not taking: Reported on 05/14/2022), Disp: 20 tablet, Rfl: 1   Misc. Devices (GOJJI WEIGHT SCALE) MISC, 1 each by Does not apply route once a week. (Patient not taking: Reported on 05/14/2022), Disp: 1 each, Rfl: 0   Multiple Vitamins-Minerals (ONCOVITE) TABS, Take 1 tablet by mouth daily. (Patient not taking: Reported on 04/30/2022), Disp: , Rfl:    ondansetron (ZOFRAN) 8 MG tablet, Take 1 tablet (8 mg total) by mouth every 8 (eight) hours as needed for nausea or vomiting. (Patient not taking: Reported on 05/14/2022), Disp: 20 tablet, Rfl: 0   ondansetron (ZOFRAN-ODT) 8 MG disintegrating tablet, Take 1 tablet (8 mg total) by mouth every 8 (eight) hours as needed for nausea or vomiting. (Patient not taking: Reported on 05/14/2022), Disp: 30 tablet, Rfl: 1   potassium chloride (KLOR-CON M) 10 MEQ tablet, Take 1 tablet (10 mEq total) by mouth 2 (two) times daily. (Patient not taking: Reported on 04/30/2022), Disp: 6 tablet, Rfl: 0   Prenatal Vit-Fe Fumarate-FA (MULTIVITAMIN-PRENATAL) 27-0.8 MG TABS tablet, Take 1 tablet by mouth daily at 12 noon. (Patient not taking: Reported on 08/03/2022), Disp: , Rfl:    promethazine (PHENERGAN) 25 MG suppository, Place 1 suppository (25 mg total) rectally every 6 (six) hours as needed for nausea or vomiting. (Patient not taking: Reported on 04/30/2022), Disp: 12 each, Rfl: 0   promethazine (PHENERGAN) 25 MG tablet, Take 1 tablet (25 mg total) by mouth every 6 (six) hours as needed for nausea or vomiting. (Patient not taking: Reported on 04/30/2022), Disp: 30 tablet, Rfl: 1   scopolamine (TRANSDERM-SCOP) 1 MG/3DAYS, Place 1 patch onto the skin every 3 (three) days as directed. (Patient not taking: Reported on 07/10/2022), Disp: 10 patch, Rfl:  0  Observations/Objective: Patient is well-developed, well-nourished in no acute distress.  Resting comfortably in bed at home.  Head is normocephalic, atraumatic.  No labored breathing.  Speech is clear and coherent with logical content.  Patient is alert and oriented at baseline.   Assessment and Plan: 1. Pelvic pressure in pregnancy  2. Rectal pressure  Giving pregnancy status and level of discomfort, needs in-person evaluation ASAP. She is to call her OB. MAU recommended by this provider. She is stating she has no one to look after kids when they get out of school so she cannot go anywhere until they get home. Discussed 911 for any worsening symptoms.   No charge for video since she needs in person eval.  Follow Up Instructions: I discussed the assessment and treatment plan with the patient. The patient was provided an opportunity to ask questions and all were answered. The patient agreed with the plan and demonstrated an understanding of the instructions.  A copy of instructions were sent to the patient via MyChart unless otherwise noted below.   The patient was advised to call back or seek an in-person evaluation if the symptoms worsen or if  the condition fails to improve as anticipated.  Time:  I spent 10 minutes with the patient via telehealth technology discussing the above problems/concerns.    Kristine Rio, PA-C

## 2022-08-05 ENCOUNTER — Other Ambulatory Visit: Payer: Self-pay

## 2022-08-05 LAB — CBC
HCT: 28.3 % — ABNORMAL LOW (ref 36.0–46.0)
Hemoglobin: 9.5 g/dL — ABNORMAL LOW (ref 12.0–15.0)
MCH: 31.8 pg (ref 26.0–34.0)
MCHC: 33.6 g/dL (ref 30.0–36.0)
MCV: 94.6 fL (ref 80.0–100.0)
Platelets: 294 10*3/uL (ref 150–400)
RBC: 2.99 MIL/uL — ABNORMAL LOW (ref 3.87–5.11)
RDW: 13.2 % (ref 11.5–15.5)
WBC: 21.2 10*3/uL — ABNORMAL HIGH (ref 4.0–10.5)
nRBC: 0 % (ref 0.0–0.2)

## 2022-08-05 LAB — HIV-1 RNA QUANT-NO REFLEX-BLD
HIV 1 RNA Quant: 30 copies/mL
LOG10 HIV-1 RNA: 1.477 log10copy/mL

## 2022-08-05 LAB — RPR: RPR Ser Ql: NONREACTIVE

## 2022-08-05 MED ORDER — FERROUS SULFATE 325 (65 FE) MG PO TABS
325.0000 mg | ORAL_TABLET | ORAL | Status: DC
  Administered 2022-08-05 – 2022-08-07 (×2): 325 mg via ORAL
  Filled 2022-08-05 (×2): qty 1

## 2022-08-05 NOTE — Progress Notes (Signed)
Subjective: pain well controlled Postpartum Day 1: Cesarean Delivery Patient reports tolerating PO and no problems voiding.    Objective: Vital signs in last 24 hours: Temp:  [97.8 F (36.6 C)-98.7 F (37.1 C)] 98 F (36.7 C) (03/06 0226) Pulse Rate:  [63-87] 72 (03/06 0517) Resp:  [12-19] 18 (03/06 0517) BP: (108-135)/(54-92) 131/76 (03/06 0517) SpO2:  [98 %-100 %] 99 % (03/06 0517)  Physical Exam:  General: alert, cooperative, and no distress Lochia: appropriate Uterine Fundus: firm Incision: no significant drainage DVT Evaluation: No evidence of DVT seen on physical exam.  Recent Labs    08/04/22 1521 08/05/22 0630  HGB 10.8* 9.5*  HCT 31.5* 28.3*    Assessment/Plan: Status post Cesarean section. Doing well postoperatively.  Continue current care Baby is in NICU.  Emeterio Reeve, MD 08/05/2022, 11:02 AM

## 2022-08-05 NOTE — Lactation Note (Signed)
This note was copied from a baby's chart.  NICU Lactation Consultation Note  Patient Name: Kristine Brown [id number] Date: 08/05/2022 Age:32 years  Subjective Reason for consult: Initial assessment; Other (Comment); NICU baby; Preterm <34wks; Infant < 6lbs (maternal HIV (+))  Visited with family of 19 hours old pre-term NICU female; Ms. Kristine Brown is a P4 and experienced breastfeeding. She reported she started pumping yesterday and already getting small droplets of colostrum, praised her for her efforts. Ms. Kristine Brown has HIV (+) status but with undetectable load. Dr. Tacy Dura advised breastfeeding/providing breastmilk for baby due to new protocol; Ms. Facey agreeable with plan. Reviewed pumping schedule, lactogenesis II, benefits of premature milk and anticipatory guidelines.  Objective Infant data: Mother's Current Feeding Choice: -- (NPO)   Maternal data: [id number]  C-Section, Classical Significant Breast History:: (++) changes during the pregnancy Current breast feeding challenges:: NICU admission  Does the patient have breastfeeding experience prior to this delivery?: Yes How long did the patient breastfeed?: Pumped for 6 months, breastfeed for 9 months  Pumping frequency: Started pumping at 9 hours post-partum Pumped volume: 0 mL (drops) Flange Size: 24  Risk factor for low milk supply:: prematurity, maternal blood loss of 450 cc.   Assessment Infant: Feeding Status: NPO  Maternal: Milk volume: Normal  Intervention/Plan Interventions: Breast feeding basics reviewed; DEBP; Education; Coconut oil; [facility] brochure  Tools: Pump; Flanges; Coconut oil Pump Education: Setup, frequency, and cleaning; Milk Storage  Plan of care: Encouraged pumping every 3 hours, ideally 8 pumping sessions/24 hours Breast massage, hand expression and coconut oil were also encouraged prior pumping  No other support person at this time. All questions and concerns answered, family to  contact [facility] services PRN.  Consult Status: NICU follow-up  NICU Follow-up type: New admission follow up; Maternal D/C visit   Lenise Herald 08/05/2022, 2:58 PM

## 2022-08-06 LAB — SURGICAL PATHOLOGY

## 2022-08-06 NOTE — Lactation Note (Signed)
This note was copied from a baby's chart.  NICU Lactation Consultation Note  Patient Name: Kristine Brown [id number] Date: 08/06/2022 Age:34 hours  Reason for consult: Follow-up assessment; Other (Comment); NICU baby; Preterm <34wks; Infant < 6lbs (Maternal HIV (+) with undetectable load)  Subjective  Visited with family of 46 hours old pre-term NICU female; Kristine Brown is a P3 and reports she's pumping consistently and already getting small amounts of colostrum, praised her for her efforts. She's been mainly rooming in with baby; she's expecting to be discharged tomorrow. Revised pumping schedule, lactogenesis II and anticipatory guidelines.  Objective Infant data: Mother's Current Feeding Choice: -- (NPO)  Maternal data: [id number]  C-Section, Classical Significant Breast History:: (++) changes during the pregnancy  Current breast feeding challenges:: NICU admission  Does the patient have breastfeeding experience prior to this delivery?: Yes How long did the patient breastfeed?: Pumped for 6 months, breastfeed for 9 months  Pumping frequency: 8 times/24 hours Pumped volume: 2 mL Flange Size: 24  Risk factor for low milk supply:: prematurity, maternal blood loss of 450 cc.  Has patient been taught Hand Expression?: Yes  Hand Expression Comments: colostrum noted  What county?: [place]  [place] Program: No WIC Referral Sent?: Yes  Assessment Infant: Feeding Status: NPO  Maternal: Milk volume: Normal  Intervention/Plan Interventions: Breast feeding basics reviewed; Coconut oil; DEBP; Education  Tools: Pump; Flanges; Coconut oil Pump Education: Setup, frequency, and cleaning; Milk Storage  Plan of care: Encouraged to continue pumping every 3 hours, ideally 8 pumping sessions/24 hours Breast massage, hand expression and coconut oil were also encouraged prior pumping   No other support person at this time. All questions and concerns answered, family to contact [facility]  services PRN.  Consult Status: NICU follow-up  NICU Follow-up type: Maternal D/C visit; Verify onset of copious milk; Verify absence of engorgement   Kristine Brown S Kristine Brown 08/06/2022, 1:54 PM

## 2022-08-06 NOTE — Progress Notes (Signed)
POSTPARTUM PROGRESS NOTE  POD # 2  Subjective:  Kristine Brown is a 32 y.o. [id number] s/p classical c section at [redacted]w[redacted]d  She reports she doing well. No acute events overnight.  She denies any problems with ambulating, voiding or po intake. Denies nausea or vomiting. She has  passed flatus. Pain is moderately controlled.  Lochia is normal.  Objective: Blood pressure 129/64, pulse 78, temperature 98.4 F (36.9 C), temperature source Oral, resp. rate 19, last menstrual period 02/27/2022, SpO2 96 %, unknown if currently breastfeeding.  Physical Exam:  General: alert, cooperative and no distress Chest: no respiratory distress Heart:regular rate, distal pulses intact Abdomen: soft, nontender, nondistended, positive bowel sounds Uterine Fundus: firm, appropriately tender DVT Evaluation: No calf swelling or tenderness Extremities: no edema Skin: warm, dry; incision clean/dry/intact w/ honeycomb dressing in place  Recent Labs    08/04/22 1521 08/05/22 0630  HGB 10.8* 9.5*  HCT 31.5* 28.3*    Assessment/Plan: Kristine Brown a 32y.o. [id number]/p classical c section at 211w4dor placental abruption.  POD#2 -  Pt is stable and is doingwell  Dispo: Plan for discharge : pt desires discharge on 08/07/22.   LOS: 2 days   Kristine ShieldsMdNew Carlislettending, Kristine Brown/11/2022, 3:17 PM

## 2022-08-06 NOTE — Clinical Social Work Maternal (Signed)
CLINICAL SOCIAL WORK MATERNAL/CHILD NOTE  Patient Details  Name: Kristine Brown MRN: [id number] Date of Birth: Dec 27, 1990  Date:  08/06/2022  Clinical Social Worker Initiating Note:  Laurey Arrow Date/Time: Initiated:  08/05/22/1017     Child's Name:  Kristine Brown   Biological Parents:  Mother (MOB's partner is Kristine Brown 04-05-96)   Need for Interpreter:  None   Reason for Referral:  Behavioral Health Concerns, Parental Support of Premature Babies < 32 weeks/or Critically Ill babies   Address:  [place] [address]    Phone number:  [phone] (home)     Additional phone number: MOB's partner's Number Kristine Brown [phone]).  Household Members/Support Persons (HM/SP):   Household Member/Support Person 1, Household Member/Support Person 2, Household Member/Support Person 3   HM/SP Name Relationship DOB or Age  HM/SP -1 Kristine Brown son 05/06/09  HM/SP -2 Kristine Brown daughter 08/10/15  HM/SP -3 Kristine Brown brother 08/12/09  HM/SP -4        HM/SP -5        HM/SP -6        HM/SP -7        HM/SP -8          Natural Supports (not living in the home):  Immediate Family, Parent, Spouse/significant other, Extended Family   Professional Supports: None   Employment: Unemployed   Type of Work:     Education:  [place] arranged:    Museum/gallery curator Resources:  [employer]   Other Resources:  Physicist, medical   (CSW gave MOB information to apply for [employer].)   Cultural/Religious Considerations Which May Impact Care:  None reported  Strengths:  Ability to meet basic needs  , Home prepared for child  , Understanding of illness   Psychotropic Medications:         Pediatrician:       Pediatrician List:   [place]      Pediatrician Fax Number:    Risk Factors/Current Problems:  Mental Health Concerns     Cognitive State:   Alert  , Able to Concentrate     Mood/Affect:  Interested  , Comfortable  , Calm  , Flat  , Relaxed     CSW Assessment: CSW meet with MOB in room 116 to complete an assessment for mental health hx and NICU admission.  When CSW arrived, MOB was in the bed resting.  MOB appeared flat however she was polite, easy to engage and receptive to meeting with CSW.   CSW inquired about MOB's MH and MOB acknowledged a hx of anxiety and depression and reported that she was dx in 2019.  Per MOB she is not currently taking any meds and her symtoms have subsided.  MOB shared that her symptoms presented to due the unexpected death of her daughters father. MOB denied having any PMAD symptoms with her older 2 children. CSW educated MOB about PMADs. CSW informed MOB of possible supports and interventions to decrease PPD.  CSW also encouraged MOB to seek medical attention if needed for increased signs and symptoms of PPD.  CSW also offered MOB resources for outpatient behavioral health services and MOB declined. CSW encouraged MOB to evaluate her mental health throughout the postpartum period and notify a medical professional if symptoms arise; MOB agreed. MOB presented with insight and awareness and  denied SI, HI, and DV when assessed for safety. MOB reported having a good support team that will be willing to help if needed. CSW reviewed safe sleep and SIDS. MOB and was knowledgeable and asked appropriate questions. MOB communicated that MOB has everything she needs for the baby and is prepared to meet her infant's needs. MOB requested that MD provided MOB's partner an update via telephone.  Per MOB, MOB's partner is currently inpatient in a hospital in [place] for sickle cell crisis (MD agreed to call and CSW provide MOB with telephone number).  MOB did not have any further questions, concerns, or needs currently.  CSW made MOB aware that infant's UDS was negative and CSW will continue to monitor infant's CDS and will make a report  to [place] is warranted. MOB denied having any CPS hx.   CSW will continue to offer resources and supports to family while infant remains in NICU.   CSW Plan/Description:  Psychosocial Support and Ongoing Assessment of Needs, Sudden Infant Death Syndrome (SIDS) Education, Perinatal Mood and Anxiety Disorder (PMADs) Education, Other Patient/Family Education, Other Information/Referral to [place], CSW Will Continue to Monitor Umbilical Cord Tissue Drug Screen Results and Make Report if Warranted   Laurey Arrow, MSW, LCSW Clinical Social Work [phone]   Dimple Nanas, LCSW 08/06/2022, 10:22 AM

## 2022-08-07 ENCOUNTER — Ambulatory Visit: Payer: Medicaid Other

## 2022-08-07 ENCOUNTER — Other Ambulatory Visit (HOSPITAL_COMMUNITY): Payer: Self-pay

## 2022-08-07 MED ORDER — OXYCODONE HCL 5 MG PO TABS
5.0000 mg | ORAL_TABLET | ORAL | 0 refills | Status: DC | PRN
  Filled 2022-08-07: qty 24, 4d supply, fill #0

## 2022-08-07 MED ORDER — IBUPROFEN 600 MG PO TABS
600.0000 mg | ORAL_TABLET | Freq: Four times a day (QID) | ORAL | 0 refills | Status: DC
  Filled 2022-08-07: qty 30, 8d supply, fill #0

## 2022-08-07 MED ORDER — FERROUS SULFATE 325 (65 FE) MG PO TABS
325.0000 mg | ORAL_TABLET | ORAL | 3 refills | Status: AC
  Filled 2022-08-07: qty 30, 60d supply, fill #0

## 2022-08-07 NOTE — Progress Notes (Signed)
Discharge instructions and prescriptions given to pt. Discussed post c-section care, signs and symptoms to report to the MD, upcoming appointments, and meds. Pt verbalizes understanding and has no questions at this time. Pt discharged home from hospital in stable condition.

## 2022-08-09 ENCOUNTER — Inpatient Hospital Stay (EMERGENCY_DEPARTMENT_HOSPITAL)
Admission: AD | Admit: 2022-08-09 | Discharge: 2022-08-09 | Disposition: A | Discharge status: Home / Self Care | Payer: Medicaid Other | Source: Home / Self Care | Attending: Obstetrics & Gynecology | Admitting: Obstetrics & Gynecology

## 2022-08-09 DIAGNOSIS — Z98891 History of uterine scar from previous surgery: Secondary | ICD-10-CM | POA: Diagnosis not present

## 2022-08-09 DIAGNOSIS — R109 Unspecified abdominal pain: Secondary | ICD-10-CM | POA: Insufficient documentation

## 2022-08-09 DIAGNOSIS — O99893 Other specified diseases and conditions complicating puerperium: Secondary | ICD-10-CM | POA: Insufficient documentation

## 2022-08-09 DIAGNOSIS — L7682 Other postprocedural complications of skin and subcutaneous tissue: Secondary | ICD-10-CM

## 2022-08-09 DIAGNOSIS — O9089 Other complications of the puerperium, not elsewhere classified: Secondary | ICD-10-CM | POA: Insufficient documentation

## 2022-08-09 DIAGNOSIS — Z87891 Personal history of nicotine dependence: Secondary | ICD-10-CM | POA: Insufficient documentation

## 2022-08-09 LAB — CBC WITH DIFFERENTIAL/PLATELET
Abs Immature Granulocytes: 0.06 10*3/uL (ref 0.00–0.07)
Basophils Absolute: 0 10*3/uL (ref 0.0–0.1)
Basophils Relative: 0 %
Eosinophils Absolute: 0 10*3/uL (ref 0.0–0.5)
Eosinophils Relative: 0 %
HCT: 30.5 % — ABNORMAL LOW (ref 36.0–46.0)
Hemoglobin: 10.3 g/dL — ABNORMAL LOW (ref 12.0–15.0)
Immature Granulocytes: 0 %
Lymphocytes Relative: 9 %
Lymphs Abs: 1.4 10*3/uL (ref 0.7–4.0)
MCH: 31.6 pg (ref 26.0–34.0)
MCHC: 33.8 g/dL (ref 30.0–36.0)
MCV: 93.6 fL (ref 80.0–100.0)
Monocytes Absolute: 1.7 10*3/uL — ABNORMAL HIGH (ref 0.1–1.0)
Monocytes Relative: 11 %
Neutro Abs: 12.8 10*3/uL — ABNORMAL HIGH (ref 1.7–7.7)
Neutrophils Relative %: 80 %
Platelets: 392 10*3/uL (ref 150–400)
RBC: 3.26 MIL/uL — ABNORMAL LOW (ref 3.87–5.11)
RDW: 12.7 % (ref 11.5–15.5)
WBC: 16 10*3/uL — ABNORMAL HIGH (ref 4.0–10.5)
nRBC: 0 % (ref 0.0–0.2)

## 2022-08-09 NOTE — MAU Provider Note (Signed)
History     CSN: [id number]  Arrival date and time: 08/09/22 2144   Event Date/Time   First Provider Initiated Contact with Patient 08/09/22 2218      Chief Complaint  Patient presents with   Abdominal Pain   Incisional Pain   Abdominal Pain Pertinent negatives include no fever.   Kristine Brown is a 32 y.o. [id number] post-op patient. She is s/p primary classical cesarean for PPROM with placental abruption on 08/04/2022. She presents to MAU with multiple concerns: asymmetrical abdomen, swollen mons pubis, abdominal pain and pain along her skin incision. Most recent bowel movement was two days ago but she does not feel constipated. She denies fever, dysuria, vomiting, joint pain.  Patient states she took her first brief trip home yesterday and spent about one hour at [place] today. She used a store-provided motorized chair.   OB History     Gravida  10   Para  3   Term  2   Preterm  1   AB  7   Living  3      SAB  6   IAB  1   Ectopic      Multiple  0   Live Births  3           Past Medical History:  Diagnosis Date   Anxiety    Asthma    Depression    HIV (human immunodeficiency virus infection) ([place])     Past Surgical History:  Procedure Laterality Date   CESAREAN SECTION N/A 08/04/2022   Procedure: CESAREAN SECTION;  Surgeon: Truett Mainland, DO;  [facility]: [facility];  Service: Obstetrics;  Laterality: N/A;   NO PAST SURGERIES      Family History  Problem Relation Age of Onset   Healthy Mother    Cancer Father    COPD Father    Healthy Daughter    Healthy Son    Asthma Neg Hx    Diabetes Neg Hx    Hearing loss Neg Hx    Hyperlipidemia Neg Hx    Stroke Neg Hx     Social History   Tobacco Use   Smoking status: Former    Types: Cigarettes   Smokeless tobacco: Never  Vaping Use   Vaping Use: Never used  Substance Use Topics   Alcohol use: Never   Drug use: Not Currently    Types: Marijuana    Allergies: No Known  Allergies  Medications Prior to Admission  Medication Sig Dispense Refill Last Dose   dolutegravir (TIVICAY) 50 MG tablet Take 1 tablet (50 mg total) by mouth daily. 30 tablet 5 08/09/2022   emtricitabine-tenofovir AF (DESCOVY) 200-25 MG tablet Take 1 tablet by mouth daily. 30 tablet 5 08/09/2022   ferrous sulfate 325 (65 FE) MG tablet Take 1 tablet (325 mg total) by mouth every other day. 30 tablet 3 08/09/2022   ibuprofen (ADVIL) 600 MG tablet Take 1 tablet (600 mg total) by mouth every 6 (six) hours. 30 tablet 0 08/09/2022   oxyCODONE (OXY IR/ROXICODONE) 5 MG immediate release tablet Take 1 tablet (5 mg total) by mouth every 4 (four) hours as needed for moderate pain. 24 tablet 0 08/09/2022   acetaminophen (TYLENOL) 500 MG tablet Take 2 tablets (1,000 mg total) by mouth every 6 (six) hours as needed. 100 tablet 2    albuterol (VENTOLIN HFA) 108 (90 Base) MCG/ACT inhaler Inhale into the lungs.       Review of Systems  Constitutional:  Positive for fatigue. Negative for appetite change, chills and fever.  Cardiovascular:  Negative for leg swelling.  Gastrointestinal:  Positive for abdominal pain.  All other systems reviewed and are negative.  Physical Exam   Blood pressure 131/66, pulse (!) 112, temperature 99 F (37.2 C), temperature source Oral, resp. rate 16, weight 70.8 kg, last menstrual period 02/27/2022, SpO2 99 %, unknown if currently breastfeeding.  Physical Exam Vitals and nursing note reviewed. Exam conducted with a chaperone present.  Constitutional:      Appearance: She is well-developed.  Cardiovascular:     Rate and Rhythm: Normal rate.  Pulmonary:     Effort: Pulmonary effort is normal.  Abdominal:     General: Bowel sounds are normal.     Palpations: Abdomen is soft.     Tenderness: There is generalized abdominal tenderness. There is guarding. There is no rebound.  Skin:    General: Skin is warm and dry.     Capillary Refill: Capillary refill takes less than 2  seconds.  Neurological:     Mental Status: She is alert and oriented to person, place, and time.  Psychiatric:        Mood and Affect: Mood normal.        Behavior: Behavior normal.    Incision: well-approximated. No drainage, bruising or streaking observed. No foul odor noted    MAU Course  Procedures  MDM --Well-appearing patient with no acute signs of atypical healing or infection --Honeycomb removed by CNM. Incision healing appropriately --Reviewed op note: patient unaware of classical uterine incision. Discussed this and attendant edema as likely and justifiable cause of her abdominal edema --ROS, physical assessment and image of wound reviewed with Dr. Harolyn Rutherford during encounter  Orders Placed This Encounter  Procedures   Culture, OB Urine   CBC with Differential/Platelet   Patient Vitals for the past 24 hrs:  BP Temp Temp src Pulse Resp SpO2 Weight  08/09/22 2157 131/66 99 F (37.2 C) Oral (!) 112 16 99 % --  08/09/22 2152 -- -- -- -- -- -- 70.8 kg   Results for orders placed or performed during the hospital encounter of 08/09/22 (from the past 24 hour(s))  CBC with Differential/Platelet     Status: Abnormal   Collection Time: 08/09/22 11:08 PM  Result Value Ref Range   WBC 16.0 (H) 4.0 - 10.5 K/uL   RBC 3.26 (L) 3.87 - 5.11 MIL/uL   Hemoglobin 10.3 (L) 12.0 - 15.0 g/dL   HCT 30.5 (L) 36.0 - 46.0 %   MCV 93.6 80.0 - 100.0 fL   MCH 31.6 26.0 - 34.0 pg   MCHC 33.8 30.0 - 36.0 g/dL   RDW 12.7 11.5 - 15.5 %   Platelets 392 150 - 400 K/uL   nRBC 0.0 0.0 - 0.2 %   Neutrophils Relative % 80 %   Neutro Abs 12.8 (H) 1.7 - 7.7 K/uL   Lymphocytes Relative 9 %   Lymphs Abs 1.4 0.7 - 4.0 K/uL   Monocytes Relative 11 %   Monocytes Absolute 1.7 (H) 0.1 - 1.0 K/uL   Eosinophils Relative 0 %   Eosinophils Absolute 0.0 0.0 - 0.5 K/uL   Basophils Relative 0 %   Basophils Absolute 0.0 0.0 - 0.1 K/uL   Immature Granulocytes 0 %   Abs Immature Granulocytes 0.06 0.00 - 0.07 K/uL    Assessment and Plan  --32 y.o. [id number] 5 days s/p primary classical cesarean  --Post-operative edema --Reassuring CBC --Incision healing appropriately --  Discharge home in stable condition  F/U: --Patient scheduled for incision check at [facility] on 08/14/2022  Darlina Rumpf, [place], MSN, CNM 08/09/2022, 11:39 PM

## 2022-08-09 NOTE — MAU Note (Signed)
..  Kristine Brown is a 32 y.o. at [facility] c-seciton 5 days here in MAU reporting: Had abdominal pain all day 10/10 around an hour ago took 10 mg of oxycodone and 600 mg ibuprofen. Pain is better now 6/10 and   woke up to shower and took clear stuff off "stomach looks weird and feels like something is in there" Swollen under where bandage was.   Pain score: 6/10 Vitals:   08/09/22 2157  BP: 131/66  Pulse: (!) 112  Resp: 16  Temp: 99 F (37.2 C)  SpO2: 99%

## 2022-08-10 ENCOUNTER — Inpatient Hospital Stay (HOSPITAL_COMMUNITY): Payer: Medicaid Other

## 2022-08-10 ENCOUNTER — Encounter (HOSPITAL_COMMUNITY): Payer: Self-pay | Admitting: Obstetrics & Gynecology

## 2022-08-10 ENCOUNTER — Inpatient Hospital Stay (HOSPITAL_COMMUNITY)
Admission: AD | Admit: 2022-08-10 | Discharge: 2022-08-17 | DRG: 776 | Disposition: A | Discharge status: Home / Self Care | Payer: Medicaid Other | Attending: Obstetrics and Gynecology | Admitting: Obstetrics and Gynecology

## 2022-08-10 DIAGNOSIS — O8600 Infection of obstetric surgical wound, unspecified: Secondary | ICD-10-CM | POA: Diagnosis present

## 2022-08-10 DIAGNOSIS — B9562 Methicillin resistant Staphylococcus aureus infection as the cause of diseases classified elsewhere: Secondary | ICD-10-CM | POA: Diagnosis present

## 2022-08-10 DIAGNOSIS — R109 Unspecified abdominal pain: Secondary | ICD-10-CM | POA: Diagnosis present

## 2022-08-10 DIAGNOSIS — Z87891 Personal history of nicotine dependence: Secondary | ICD-10-CM

## 2022-08-10 DIAGNOSIS — T8149XA Infection following a procedure, other surgical site, initial encounter: Secondary | ICD-10-CM | POA: Diagnosis present

## 2022-08-10 DIAGNOSIS — O9081 Anemia of the puerperium: Secondary | ICD-10-CM | POA: Diagnosis present

## 2022-08-10 DIAGNOSIS — F32A Depression, unspecified: Secondary | ICD-10-CM | POA: Diagnosis present

## 2022-08-10 DIAGNOSIS — O9873 Human immunodeficiency virus [HIV] disease complicating the puerperium: Secondary | ICD-10-CM | POA: Diagnosis present

## 2022-08-10 DIAGNOSIS — Z98891 History of uterine scar from previous surgery: Secondary | ICD-10-CM | POA: Diagnosis not present

## 2022-08-10 DIAGNOSIS — Z21 Asymptomatic human immunodeficiency virus [HIV] infection status: Secondary | ICD-10-CM | POA: Diagnosis present

## 2022-08-10 DIAGNOSIS — L7682 Other postprocedural complications of skin and subcutaneous tissue: Secondary | ICD-10-CM | POA: Diagnosis not present

## 2022-08-10 DIAGNOSIS — O99893 Other specified diseases and conditions complicating puerperium: Secondary | ICD-10-CM | POA: Diagnosis present

## 2022-08-10 DIAGNOSIS — O9089 Other complications of the puerperium, not elsewhere classified: Principal | ICD-10-CM

## 2022-08-10 DIAGNOSIS — O8604 Sepsis following an obstetrical procedure: Secondary | ICD-10-CM | POA: Diagnosis present

## 2022-08-10 LAB — COMPREHENSIVE METABOLIC PANEL
ALT: 32 U/L (ref 0–44)
AST: 29 U/L (ref 15–41)
Albumin: 2.6 g/dL — ABNORMAL LOW (ref 3.5–5.0)
Alkaline Phosphatase: 85 U/L (ref 38–126)
Anion gap: 14 (ref 5–15)
BUN: 7 mg/dL (ref 6–20)
CO2: 20 mmol/L — ABNORMAL LOW (ref 22–32)
Calcium: 8.9 mg/dL (ref 8.9–10.3)
Chloride: 97 mmol/L — ABNORMAL LOW (ref 98–111)
Creatinine, Ser: 0.69 mg/dL (ref 0.44–1.00)
GFR, Estimated: >60 mL/min (ref >60)
Glucose, Bld: 94 mg/dL (ref 70–99)
Potassium: 3.3 mmol/L — ABNORMAL LOW (ref 3.5–5.1)
Sodium: 131 mmol/L — ABNORMAL LOW (ref 135–145)
Total Bilirubin: 0.7 mg/dL (ref 0.3–1.2)
Total Protein: 6.6 g/dL (ref 6.5–8.1)

## 2022-08-10 LAB — CBC
HCT: 28.7 % — ABNORMAL LOW (ref 36.0–46.0)
Hemoglobin: 10 g/dL — ABNORMAL LOW (ref 12.0–15.0)
MCH: 32.1 pg (ref 26.0–34.0)
MCHC: 34.8 g/dL (ref 30.0–36.0)
MCV: 92 fL (ref 80.0–100.0)
Platelets: 379 10*3/uL (ref 150–400)
RBC: 3.12 MIL/uL — ABNORMAL LOW (ref 3.87–5.11)
RDW: 12.8 % (ref 11.5–15.5)
WBC: 20.5 10*3/uL — ABNORMAL HIGH (ref 4.0–10.5)
nRBC: 0 % (ref 0.0–0.2)

## 2022-08-10 LAB — MRSA NEXT GEN BY PCR, NASAL: MRSA by PCR Next Gen: NOT DETECTED

## 2022-08-10 LAB — CULTURE, OB URINE: Culture: NO GROWTH

## 2022-08-10 LAB — LACTIC ACID, PLASMA: Lactic Acid, Venous: 1 mmol/L (ref 0.5–1.9)

## 2022-08-10 MED ORDER — POTASSIUM CHLORIDE CRYS ER 20 MEQ PO TBCR
20.0000 meq | EXTENDED_RELEASE_TABLET | Freq: Two times a day (BID) | ORAL | Status: AC
  Administered 2022-08-10 – 2022-08-13 (×7): 20 meq via ORAL
  Filled 2022-08-10 (×8): qty 1

## 2022-08-10 MED ORDER — PRENATAL MULTIVITAMIN CH
1.0000 | ORAL_TABLET | Freq: Every day | ORAL | Status: DC
  Administered 2022-08-12 – 2022-08-17 (×6): 1 via ORAL
  Filled 2022-08-10 (×7): qty 1

## 2022-08-10 MED ORDER — OXYCODONE HCL 5 MG PO TABS
5.0000 mg | ORAL_TABLET | ORAL | Status: DC | PRN
  Administered 2022-08-12: 5 mg via ORAL
  Filled 2022-08-10: qty 1

## 2022-08-10 MED ORDER — VANCOMYCIN HCL 750 MG/150ML IV SOLN
750.0000 mg | Freq: Three times a day (TID) | INTRAVENOUS | Status: DC
  Administered 2022-08-11 – 2022-08-16 (×15): 750 mg via INTRAVENOUS
  Filled 2022-08-10 (×18): qty 150

## 2022-08-10 MED ORDER — LACTATED RINGERS IV BOLUS
1000.0000 mL | Freq: Once | INTRAVENOUS | Status: AC
  Administered 2022-08-10: 1000 mL via INTRAVENOUS

## 2022-08-10 MED ORDER — SODIUM CHLORIDE 0.9 % IV SOLN: INTRAVENOUS | Status: DC

## 2022-08-10 MED ORDER — IBUPROFEN 600 MG PO TABS
600.0000 mg | ORAL_TABLET | Freq: Four times a day (QID) | ORAL | Status: DC | PRN
  Administered 2022-08-11: 600 mg via ORAL
  Filled 2022-08-10: qty 1

## 2022-08-10 MED ORDER — SODIUM CHLORIDE 0.9 % IV SOLN
2.0000 g | INTRAVENOUS | Status: DC
  Administered 2022-08-10 – 2022-08-15 (×6): 2 g via INTRAVENOUS
  Filled 2022-08-10 (×6): qty 20

## 2022-08-10 MED ORDER — IOHEXOL 350 MG/ML SOLN
75.0000 mL | Freq: Once | INTRAVENOUS | Status: AC | PRN
  Administered 2022-08-10: 75 mL via INTRAVENOUS

## 2022-08-10 MED ORDER — CYCLOBENZAPRINE HCL 10 MG PO TABS
10.0000 mg | ORAL_TABLET | Freq: Three times a day (TID) | ORAL | Status: DC | PRN
  Administered 2022-08-17: 10 mg via ORAL
  Filled 2022-08-10: qty 1

## 2022-08-10 MED ORDER — CEFAZOLIN SODIUM-DEXTROSE 1-4 GM/50ML-% IV SOLN
1.0000 g | Freq: Three times a day (TID) | INTRAVENOUS | Status: DC
  Administered 2022-08-10: 1 g via INTRAVENOUS
  Filled 2022-08-10 (×2): qty 50

## 2022-08-10 MED ORDER — ACETAMINOPHEN 325 MG PO TABS
650.0000 mg | ORAL_TABLET | ORAL | Status: DC | PRN
  Administered 2022-08-11 – 2022-08-13 (×6): 650 mg via ORAL
  Filled 2022-08-10 (×6): qty 2

## 2022-08-10 MED ORDER — ONDANSETRON 4 MG PO TBDP: 4.0000 mg | ORAL_TABLET | Freq: Four times a day (QID) | ORAL | Status: DC | PRN

## 2022-08-10 MED ORDER — HYDROXYZINE HCL 50 MG PO TABS: 50.0000 mg | ORAL_TABLET | Freq: Three times a day (TID) | ORAL | Status: DC | PRN

## 2022-08-10 MED ORDER — HYDROMORPHONE HCL 1 MG/ML IJ SOLN
0.5000 mg | Freq: Once | INTRAMUSCULAR | Status: AC
  Administered 2022-08-10: 0.5 mg via INTRAVENOUS
  Filled 2022-08-10: qty 1

## 2022-08-10 MED ORDER — DIPHENHYDRAMINE HCL 25 MG PO CAPS: 25.0000 mg | ORAL_CAPSULE | Freq: Four times a day (QID) | ORAL | Status: DC | PRN

## 2022-08-10 MED ORDER — HYDROMORPHONE HCL 1 MG/ML IJ SOLN
0.2000 mg | INTRAMUSCULAR | Status: DC | PRN
  Administered 2022-08-10 – 2022-08-15 (×23): 0.6 mg via INTRAVENOUS
  Filled 2022-08-10 (×24): qty 1

## 2022-08-10 MED ORDER — VANCOMYCIN HCL 1500 MG/300ML IV SOLN
1500.0000 mg | Freq: Once | INTRAVENOUS | Status: AC
  Administered 2022-08-10: 1500 mg via INTRAVENOUS
  Filled 2022-08-10: qty 300

## 2022-08-10 MED ORDER — DOCUSATE SODIUM 100 MG PO CAPS
100.0000 mg | ORAL_CAPSULE | Freq: Two times a day (BID) | ORAL | Status: DC | PRN
  Administered 2022-08-12: 100 mg via ORAL
  Filled 2022-08-10: qty 1

## 2022-08-10 MED ORDER — OXYCODONE HCL 5 MG PO TABS
10.0000 mg | ORAL_TABLET | ORAL | Status: DC | PRN
  Administered 2022-08-11 – 2022-08-17 (×18): 10 mg via ORAL
  Filled 2022-08-10 (×18): qty 2

## 2022-08-10 MED ORDER — ACETAMINOPHEN 500 MG PO TABS
1000.0000 mg | ORAL_TABLET | Freq: Once | ORAL | Status: AC
  Administered 2022-08-10: 1000 mg via ORAL
  Filled 2022-08-10: qty 2

## 2022-08-10 MED ORDER — DOLUTEGRAVIR SODIUM 50 MG PO TABS
50.0000 mg | ORAL_TABLET | Freq: Every day | ORAL | Status: DC
  Administered 2022-08-12 – 2022-08-17 (×6): 50 mg via ORAL
  Filled 2022-08-10 (×7): qty 1

## 2022-08-10 MED ORDER — EMTRICITABINE-TENOFOVIR AF 200-25 MG PO TABS
1.0000 | ORAL_TABLET | Freq: Every day | ORAL | Status: DC
  Administered 2022-08-12 – 2022-08-17 (×6): 1 via ORAL
  Filled 2022-08-10 (×7): qty 1

## 2022-08-10 MED ORDER — METRONIDAZOLE 500 MG/100ML IV SOLN
500.0000 mg | Freq: Two times a day (BID) | INTRAVENOUS | Status: DC
  Administered 2022-08-10 – 2022-08-16 (×11): 500 mg via INTRAVENOUS
  Filled 2022-08-10 (×11): qty 100

## 2022-08-10 NOTE — Progress Notes (Signed)
Pharmacy Antibiotic Note  Kristine Brown is a 32 y.o. female admitted on 08/10/2022 s/p classical Csection on 08/04/22.  Pharmacy has been consulted for Vancomycin dosing for wound infection/sepsis.  Plan: Vancomycin '1500mg'$  IV x 1 then '750mg'$  IV q8h using AUC =508.5. Will check levels to targt AUC 400-550.      Temp (24hrs), Avg:100.6 F (38.1 C), Min:99 F (37.2 C), Max:103 F (39.4 C)  Recent Labs  Lab 08/04/22 1521 08/05/22 0630 08/09/22 2308 08/10/22 1922  WBC 12.3* 21.2* 16.0* 20.5*  CREATININE  --   --   --  0.69  LATICACIDVEN  --   --   --  1.0    Estimated Creatinine Clearance: 91.4 mL/min (by C-G formula based on SCr of 0.69 mg/dL).    No Known Allergies  Antimicrobials this admission: Cefazolin 1 gram IV 3/11 Metronidazole '500mg'$  IV q12h 3/11 >> Ceftriaxone 2 gram IV q24h 3/11 >>    Microbiology results: 3/11 BCx: pending 3/11 BCx: pending    Thank you for allowing pharmacy to be a part of this patient's care.  Vernie Ammons 08/10/2022 9:13 PM

## 2022-08-10 NOTE — MAU Note (Signed)
.  Kristine Brown is a 32 y.o. at [facility] here in MAU reporting: pain at her incision site and feeling like she has a fever and chills. She also feels like her incision is hot and has to take her pain medication around the clock to control the pain.   Pain score: 10 Vitals:   08/10/22 1830  BP: 133/72  Pulse: (!) 148  Resp: 17  Temp: (!) 103 F (39.4 C)  SpO2: 99%

## 2022-08-10 NOTE — Sepsis Progress Note (Signed)
Elink monitoring for the code sepsis protocol.  

## 2022-08-10 NOTE — H&P (Signed)
History    CSN: [id number]   Arrival date and time: 08/10/22 1807    None      No chief complaint on file.   HPI This is a 32yo [id number] who is 6 days status post classical c/s due to placental abruption. She was seen in the MAU last night due to concerns of incision infection. She was evaluated, then discharged to home with return precautions. She returns now with fever, increasing pain to the incision. The incision is quite warm to the touch. No palliating or provoking factors. She hasn't had much to eat today.   OB History       Gravida  10   Para  3   Term  2   Preterm  1   AB  7   Living  3        SAB  6   IAB  1   Ectopic      Multiple  0   Live Births  3                   Past Medical History:  Diagnosis Date   Anxiety     Asthma     Depression     HIV (human immunodeficiency virus infection) ([place])             Past Surgical History:  Procedure Laterality Date   CESAREAN SECTION N/A 08/04/2022    Procedure: CESAREAN SECTION;  Surgeon: Truett Mainland, DO;  Location: [facility];  Service: Obstetrics;  Laterality: N/A;   NO PAST SURGERIES               Family History  Problem Relation Age of Onset   Healthy Mother     Cancer Father     COPD Father     Healthy Daughter     Healthy Son     Asthma Neg Hx     Diabetes Neg Hx     Hearing loss Neg Hx     Hyperlipidemia Neg Hx     Stroke Neg Hx        Social History         Tobacco Use   Smoking status: Former      Types: Cigarettes   Smokeless tobacco: Never  Vaping Use   Vaping Use: Never used  Substance Use Topics   Alcohol use: Never   Drug use: Not Currently      Types: Marijuana      Allergies: No Known Allergies          Medications Prior to Admission  Medication Sig Dispense Refill Last Dose   acetaminophen (TYLENOL) 500 MG tablet Take 2 tablets (1,000 mg total) by mouth every 6 (six) hours as needed. 100 tablet 2 08/09/2022   dolutegravir (TIVICAY) 50 MG tablet Take 1  tablet (50 mg total) by mouth daily. 30 tablet 5 08/10/2022   emtricitabine-tenofovir AF (DESCOVY) 200-25 MG tablet Take 1 tablet by mouth daily. 30 tablet 5 08/10/2022   ferrous sulfate 325 (65 FE) MG tablet Take 1 tablet (325 mg total) by mouth every other day. 30 tablet 3 08/10/2022   ibuprofen (ADVIL) 600 MG tablet Take 1 tablet (600 mg total) by mouth every 6 (six) hours. 30 tablet 0 08/10/2022   oxyCODONE (OXY IR/ROXICODONE) 5 MG immediate release tablet Take 1 tablet (5 mg total) by mouth every 4 (four) hours as needed for moderate pain. 24 tablet 0 08/10/2022   albuterol (VENTOLIN  HFA) 108 (90 Base) MCG/ACT inhaler Inhale into the lungs.            Review of Systems Physical Exam    Blood pressure 133/72, pulse (!) 148, temperature (!) 103 F (39.4 C), temperature source Oral, resp. rate 17, SpO2 99 %, unknown if currently breastfeeding.   Physical Exam Vitals and nursing note reviewed.  Constitutional:      Appearance: Normal appearance.  Cardiovascular:     Rate and Rhythm: Normal rate and regular rhythm.  Pulmonary:     Effort: Pulmonary effort is normal.  Abdominal:     General: Abdomen is flat.     Palpations: Abdomen is soft.     Comments: Continue the patient still incision and erythematous and quite warm to the touch.  There is a degree of induration to the incision into the area approximately 4 cm above the incision that extends all along.  The mons tenderness not indurated, however there is some erythema and tenderness to that area as well.  Neurological:     Mental Status: She is alert.  Psychiatric:        Mood and Affect: Mood normal.        Behavior: Behavior normal.        Thought Content: Thought content normal.        Judgment: Judgment normal.      Lab Results Last 24 Hours       Results for orders placed or performed during the hospital encounter of 08/10/22 (from the past 24 hour(s))  CBC     Status: Abnormal    Collection Time: 08/10/22  7:22 PM  Result  Value Ref Range    WBC 20.5 (H) 4.0 - 10.5 K/uL    RBC 3.12 (L) 3.87 - 5.11 MIL/uL    Hemoglobin 10.0 (L) 12.0 - 15.0 g/dL    HCT 28.7 (L) 36.0 - 46.0 %    MCV 92.0 80.0 - 100.0 fL    MCH 32.1 26.0 - 34.0 pg    MCHC 34.8 30.0 - 36.0 g/dL    RDW 12.8 11.5 - 15.5 %    Platelets 379 150 - 400 K/uL    nRBC 0.0 0.0 - 0.2 %  Comprehensive metabolic panel     Status: Abnormal    Collection Time: 08/10/22  7:22 PM  Result Value Ref Range    Sodium 131 (L) 135 - 145 mmol/L    Potassium 3.3 (L) 3.5 - 5.1 mmol/L    Chloride 97 (L) 98 - 111 mmol/L    CO2 20 (L) 22 - 32 mmol/L    Glucose, Bld 94 70 - 99 mg/dL    BUN 7 6 - 20 mg/dL    Creatinine, Ser 0.69 0.44 - 1.00 mg/dL    Calcium 8.9 8.9 - 10.3 mg/dL    Total Protein 6.6 6.5 - 8.1 g/dL    Albumin 2.6 (L) 3.5 - 5.0 g/dL    AST 29 15 - 41 U/L    ALT 32 0 - 44 U/L    Alkaline Phosphatase 85 38 - 126 U/L    Total Bilirubin 0.7 0.3 - 1.2 mg/dL    GFR, Estimated >60 >60 mL/min    Anion gap 14 5 - 15  Lactic acid, plasma     Status: None    Collection Time: 08/10/22  7:22 PM  Result Value Ref Range    Lactic Acid, Venous 1.0 0.5 - 1.9 mmol/L  MAU Course  Procedures   MDM With a heart rate in the 140s.  Temperature of 103, patient meets criteria for sepsis.  Will obtain CBC, CMP, lactic acid.  Will need to get imaging of the abdomen to evaluate for abscess.  Start IV fluid bolus x 2 L (30 mg/kg).  Due to patient's HIV status, will check CD4 count.   Patient signed out to Gavin Pound, CNM at [place], DO     Assessment and Plan  Reassessment (9:12 PM) -Patient results as above. -Dr. Jeani Sow. Jhace Fennell contacted and updated on patient results. Advised: *Proceed with imaging; CT scan of abdomen and pelvis. *Await results.    Reassessment (10:32 PM) -Dr. Harolyn Rutherford calls and states patient should be admitted. -Orders placed.    Maryann Conners MSN, CNM Advanced Practice Provider, Center for [place] [facility] Attending Supervision of Advanced Practice Provider (CNM/NP/PA): Evaluation and management procedures were performed by the Advanced Practice Provider under my supervision and collaboration.  I have reviewed the Advanced Practice Provider's note and chart, and I agree with the management and plan. I have also made any necessary editorial changes.  Patient met and evaluated.  Meets criteria for sepsis.  She has been started on Rocephin, Vancomycin and Flagyl for presumed incisional infection (cellulitis vs abscess).  Received 2L of IV fluids.  Currently afebrile, last fever was 103 F at 1800 today.   WBC 20K, Lactic acid 1.0.  Patient is HIV positive, undetectable viral load, on Descovy and Tivicay.  CT scan ordered, patient is still waiting to go for this.  - Admit to [facility] to continue inpatient observation until at least 24 hours afebrile - Follow up CT scan results and manage accordingly - Analgesia as needed - Encourage OOB for VTE prophylaxis, consider prophylactic Lovenox if no anticipated procedures - Routine postpartum and floor care.   Fatima Sanger, MD, [place] Attending [place], [facility] for [employer], [place]

## 2022-08-10 NOTE — MAU Provider Note (Addendum)
History     CSN: [id number]  Arrival date and time: 08/10/22 1807   None     No chief complaint on file.  HPI This is a 32yo [id number] who is 6 days status post classical c/s due to placental abruption. She was seen in the MAU last night due to concerns of incision infection. She was evaluated, then discharged to home with return precautions. She returns now with fever, increasing pain to the incision. The incision is quite warm to the touch. No palliating or provoking factors. She hasn't had much to eat today.  OB History     Gravida  10   Para  3   Term  2   Preterm  1   AB  7   Living  3      SAB  6   IAB  1   Ectopic      Multiple  0   Live Births  3           Past Medical History:  Diagnosis Date   Anxiety    Asthma    Depression    HIV (human immunodeficiency virus infection) ([place])     Past Surgical History:  Procedure Laterality Date   CESAREAN SECTION N/A 08/04/2022   Procedure: CESAREAN SECTION;  Surgeon: Truett Mainland, DO;  Location: [facility];  Service: Obstetrics;  Laterality: N/A;   NO PAST SURGERIES      Family History  Problem Relation Age of Onset   Healthy Mother    Cancer Father    COPD Father    Healthy Daughter    Healthy Son    Asthma Neg Hx    Diabetes Neg Hx    Hearing loss Neg Hx    Hyperlipidemia Neg Hx    Stroke Neg Hx     Social History   Tobacco Use   Smoking status: Former    Types: Cigarettes   Smokeless tobacco: Never  Vaping Use   Vaping Use: Never used  Substance Use Topics   Alcohol use: Never   Drug use: Not Currently    Types: Marijuana    Allergies: No Known Allergies  Medications Prior to Admission  Medication Sig Dispense Refill Last Dose   acetaminophen (TYLENOL) 500 MG tablet Take 2 tablets (1,000 mg total) by mouth every 6 (six) hours as needed. 100 tablet 2 08/09/2022   dolutegravir (TIVICAY) 50 MG tablet Take 1 tablet (50 mg total) by mouth daily. 30 tablet 5 08/10/2022    emtricitabine-tenofovir AF (DESCOVY) 200-25 MG tablet Take 1 tablet by mouth daily. 30 tablet 5 08/10/2022   ferrous sulfate 325 (65 FE) MG tablet Take 1 tablet (325 mg total) by mouth every other day. 30 tablet 3 08/10/2022   ibuprofen (ADVIL) 600 MG tablet Take 1 tablet (600 mg total) by mouth every 6 (six) hours. 30 tablet 0 08/10/2022   oxyCODONE (OXY IR/ROXICODONE) 5 MG immediate release tablet Take 1 tablet (5 mg total) by mouth every 4 (four) hours as needed for moderate pain. 24 tablet 0 08/10/2022   albuterol (VENTOLIN HFA) 108 (90 Base) MCG/ACT inhaler Inhale into the lungs.       Review of Systems Physical Exam   Blood pressure 133/72, pulse (!) 148, temperature (!) 103 F (39.4 C), temperature source Oral, resp. rate 17, SpO2 99 %, unknown if currently breastfeeding.  Physical Exam Vitals and nursing note reviewed.  Constitutional:      Appearance: Normal appearance.  Cardiovascular:  Rate and Rhythm: Normal rate and regular rhythm.  Pulmonary:     Effort: Pulmonary effort is normal.  Abdominal:     General: Abdomen is flat.     Palpations: Abdomen is soft.     Comments: Continue the patient still incision and erythematous and quite warm to the touch.  There is a degree of induration to the incision into the area approximately 4 cm above the incision that extends all along.  The mons tenderness not indurated, however there is some erythema and tenderness to that area as well.  Neurological:     Mental Status: She is alert.  Psychiatric:        Mood and Affect: Mood normal.        Behavior: Behavior normal.        Thought Content: Thought content normal.        Judgment: Judgment normal.    Results for orders placed or performed during the hospital encounter of 08/10/22 (from the past 24 hour(s))  CBC     Status: Abnormal   Collection Time: 08/10/22  7:22 PM  Result Value Ref Range   WBC 20.5 (H) 4.0 - 10.5 K/uL   RBC 3.12 (L) 3.87 - 5.11 MIL/uL   Hemoglobin 10.0 (L)  12.0 - 15.0 g/dL   HCT 28.7 (L) 36.0 - 46.0 %   MCV 92.0 80.0 - 100.0 fL   MCH 32.1 26.0 - 34.0 pg   MCHC 34.8 30.0 - 36.0 g/dL   RDW 12.8 11.5 - 15.5 %   Platelets 379 150 - 400 K/uL   nRBC 0.0 0.0 - 0.2 %  Comprehensive metabolic panel     Status: Abnormal   Collection Time: 08/10/22  7:22 PM  Result Value Ref Range   Sodium 131 (L) 135 - 145 mmol/L   Potassium 3.3 (L) 3.5 - 5.1 mmol/L   Chloride 97 (L) 98 - 111 mmol/L   CO2 20 (L) 22 - 32 mmol/L   Glucose, Bld 94 70 - 99 mg/dL   BUN 7 6 - 20 mg/dL   Creatinine, Ser 0.69 0.44 - 1.00 mg/dL   Calcium 8.9 8.9 - 10.3 mg/dL   Total Protein 6.6 6.5 - 8.1 g/dL   Albumin 2.6 (L) 3.5 - 5.0 g/dL   AST 29 15 - 41 U/L   ALT 32 0 - 44 U/L   Alkaline Phosphatase 85 38 - 126 U/L   Total Bilirubin 0.7 0.3 - 1.2 mg/dL   GFR, Estimated >60 >60 mL/min   Anion gap 14 5 - 15  Lactic acid, plasma     Status: None   Collection Time: 08/10/22  7:22 PM  Result Value Ref Range   Lactic Acid, Venous 1.0 0.5 - 1.9 mmol/L     MAU Course  Procedures  MDM With a heart rate in the 140s.  Temperature of 103, patient meets criteria for sepsis.  Will obtain CBC, CMP, lactic acid.  Will need to get imaging of the abdomen to evaluate for abscess.  Start IV fluid bolus x 2 L (30 mg/kg).  Due to patient's HIV status, will check CD4 count.  Patient signed out to Gavin Pound, CNM at [place], DO   Assessment and Plan  Reassessment (9:12 PM) -Patient results as above. -Dr. Jeani Sow. Anyanwu contacted and updated on patient results. Advised: *Proceed with imaging; CT scan of abdomen and pelvis. *Await results.   Reassessment (10:32 PM) -Dr. Harolyn Rutherford calls and states patient should be  admitted. -Orders placed.   Maryann Conners MSN, CNM Advanced Practice Provider, Center for [employer]

## 2022-08-11 ENCOUNTER — Other Ambulatory Visit: Payer: Self-pay

## 2022-08-11 DIAGNOSIS — T8149XA Infection following a procedure, other surgical site, initial encounter: Secondary | ICD-10-CM

## 2022-08-11 LAB — CBC WITH DIFFERENTIAL/PLATELET
Abs Immature Granulocytes: 0.38 10*3/uL — ABNORMAL HIGH (ref 0.00–0.07)
Basophils Absolute: 0.1 10*3/uL (ref 0.0–0.1)
Basophils Relative: 0 %
Eosinophils Absolute: 0 10*3/uL (ref 0.0–0.5)
Eosinophils Relative: 0 %
HCT: 26.1 % — ABNORMAL LOW (ref 36.0–46.0)
Hemoglobin: 8.8 g/dL — ABNORMAL LOW (ref 12.0–15.0)
Immature Granulocytes: 2 %
Lymphocytes Relative: 4 %
Lymphs Abs: 0.8 10*3/uL (ref 0.7–4.0)
MCH: 31.8 pg (ref 26.0–34.0)
MCHC: 33.7 g/dL (ref 30.0–36.0)
MCV: 94.2 fL (ref 80.0–100.0)
Monocytes Absolute: 2 10*3/uL — ABNORMAL HIGH (ref 0.1–1.0)
Monocytes Relative: 9 %
Neutro Abs: 19 10*3/uL — ABNORMAL HIGH (ref 1.7–7.7)
Neutrophils Relative %: 85 %
Platelets: 358 10*3/uL (ref 150–400)
RBC: 2.77 MIL/uL — ABNORMAL LOW (ref 3.87–5.11)
RDW: 12.9 % (ref 11.5–15.5)
WBC: 22.4 10*3/uL — ABNORMAL HIGH (ref 4.0–10.5)
nRBC: 0 % (ref 0.0–0.2)

## 2022-08-11 LAB — CD4/CD8 (T-HELPER/T-SUPPRESSOR CELL)
CD4 absolute: 354 /uL — ABNORMAL LOW (ref 400–1790)
CD4%: 42.25 % (ref 33–65)
CD8 T Cell Abs: 226 /uL (ref 190–1000)
CD8tox: 26.97 % (ref 12–40)
Ratio: 1.57 (ref 1.0–3.0)
Total lymphocyte count: 838 /uL — ABNORMAL LOW (ref 1000–4000)

## 2022-08-11 LAB — BASIC METABOLIC PANEL
Anion gap: 9 (ref 5–15)
BUN: 6 mg/dL (ref 6–20)
CO2: 22 mmol/L (ref 22–32)
Calcium: 8.5 mg/dL — ABNORMAL LOW (ref 8.9–10.3)
Chloride: 104 mmol/L (ref 98–111)
Creatinine, Ser: 0.59 mg/dL (ref 0.44–1.00)
GFR, Estimated: >60 mL/min (ref >60)
Glucose, Bld: 112 mg/dL — ABNORMAL HIGH (ref 70–99)
Potassium: 3.8 mmol/L (ref 3.5–5.1)
Sodium: 135 mmol/L (ref 135–145)

## 2022-08-11 MED ORDER — IRON SUCROSE 500 MG IVPB - SIMPLE MED
500.0000 mg | Freq: Once | INTRAVENOUS | Status: AC
  Administered 2022-08-11: 500 mg via INTRAVENOUS
  Filled 2022-08-11: qty 275

## 2022-08-11 MED ORDER — KETOROLAC TROMETHAMINE 30 MG/ML IJ SOLN
30.0000 mg | Freq: Four times a day (QID) | INTRAMUSCULAR | Status: DC
  Administered 2022-08-11 – 2022-08-12 (×5): 30 mg via INTRAVENOUS
  Filled 2022-08-11 (×5): qty 1

## 2022-08-11 MED ORDER — KETOROLAC TROMETHAMINE 30 MG/ML IJ SOLN: 30.0000 mg | Freq: Four times a day (QID) | INTRAMUSCULAR | Status: DC

## 2022-08-11 MED ORDER — ENOXAPARIN SODIUM 40 MG/0.4ML IJ SOSY
40.0000 mg | PREFILLED_SYRINGE | Freq: Every morning | INTRAMUSCULAR | Status: DC
  Administered 2022-08-11: 40 mg via SUBCUTANEOUS
  Filled 2022-08-11 (×2): qty 0.4

## 2022-08-11 MED ORDER — COCONUT OIL OIL
1.0000 | TOPICAL_OIL | Status: DC | PRN
  Administered 2022-08-16: 1 via TOPICAL

## 2022-08-11 NOTE — Progress Notes (Addendum)
Patient stated she took her home medications (Tivicay and Descovy). Patient educated that she can not take home medications while in the hospital without pharmacy approval. Patient refused to give medications to RN, and said the medications would be sent home today with a family member.

## 2022-08-11 NOTE — Progress Notes (Signed)
Faculty Practice OB/GYN Attending Note  Subjective:  Patient still reports some pain around incision and mon pubis. No more fevers, chills. Eating well, voiding and OOB without difficulty. Going to NICU to see her baby.  Admitted on 08/10/2022 for Infected incision.    Objective:  Patient Vitals for the past 24 hrs:  BP Temp Temp src Pulse Resp SpO2  08/11/22 0425 (!) 98/50 98.4 F (36.9 C) Oral 98 17 99 %  08/11/22 0037 129/71 98.2 F (36.8 C) Oral (!) 114 18 99 %  08/10/22 2332 108/66 98 F (36.7 C) Oral (!) 112 18 99 %  08/10/22 1959 121/69 99.7 F (37.6 C) -- (!) 124 -- --  08/10/22 1830 133/72 (!) 103 F (39.4 C) Oral (!) 148 17 99 %   Gen: NAD HENT: Normocephalic, atraumatic Lungs: Normal respiratory effort Heart: Regular rate noted Abdomen: Warm, edematous, mildly erythematous area from around 4 cm above incision extending to mons pubis. Very tender to touch. No drainage. No collections palpated, Ext: 2+ DTRs, no edema, no cyanosis, negative Homan's sign  Labs and Imaging:    Latest Ref Rng & Units 08/11/2022    5:35 AM 08/10/2022    7:22 PM 08/09/2022   11:08 PM  CBC  WBC 4.0 - 10.5 K/uL 22.4  20.5  16.0   Hemoglobin 12.0 - 15.0 g/dL 8.8  10.0  10.3   Hematocrit 36.0 - 46.0 % 26.1  28.7  30.5   Platelets 150 - 400 K/uL 358  379  392       Latest Ref Rng & Units 08/11/2022    5:35 AM 08/10/2022    7:22 PM 06/01/2022   10:34 PM  CMP  Glucose 70 - 99 mg/dL 112  94  92   BUN 6 - 20 mg/dL '6  7  7   '$ Creatinine 0.44 - 1.00 mg/dL 0.59  0.69  0.65   Sodium 135 - 145 mmol/L 135  131  131   Potassium 3.5 - 5.1 mmol/L 3.8  3.3  3.5   Chloride 98 - 111 mmol/L 104  97  104   CO2 22 - 32 mmol/L '22  20  20   '$ Calcium 8.9 - 10.3 mg/dL 8.5  8.9  9.3   Total Protein 6.5 - 8.1 g/dL  6.6  6.5   Total Bilirubin 0.3 - 1.2 mg/dL  0.7  0.3   Alkaline Phos 38 - 126 U/L  85  43   AST 15 - 41 U/L  29  20   ALT 0 - 44 U/L  32  15    CT ABDOMEN PELVIS W CONTRAST  Result Date:  08/10/2022 CLINICAL DATA:  Status post cesarean section, incisional infection. EXAM: CT ABDOMEN AND PELVIS WITH CONTRAST TECHNIQUE: Multidetector CT imaging of the abdomen and pelvis was performed using the standard protocol following bolus administration of intravenous contrast. RADIATION DOSE REDUCTION: This exam was performed according to the departmental dose-optimization program which includes automated exposure control, adjustment of the mA and/or kV according to patient size and/or use of iterative reconstruction technique. CONTRAST:  42m OMNIPAQUE IOHEXOL 350 MG/ML SOLN COMPARISON:  CT examination dated February 07, 2020 FINDINGS: Lower chest: No acute abnormality. Hepatobiliary: No focal liver abnormality is seen. No gallstones, gallbladder wall thickening, or biliary dilatation. Pancreas: Unremarkable. No pancreatic ductal dilatation or surrounding inflammatory changes. Spleen: Normal in size without focal abnormality. Adrenals/Urinary Tract: Adrenal glands are unremarkable. Kidneys are normal, without renal calculi, focal lesion, or hydronephrosis.  Bladder is unremarkable. Stomach/Bowel: Stomach is within normal limits. Appendix not clearly identified. No evidence of bowel wall thickening, distention, or inflammatory changes. Vascular/Lymphatic: No significant vascular findings are present. No enlarged abdominal or pelvic lymph nodes. Reproductive: Large heterogeneously enhancing post gravid uterus with fluid in the endometrial canal. Other: There is skin thickening and marked subcutaneous soft tissue edema. Subcutaneous fluid collection about the site of prior C-section incision which may be postsurgical hematoma/seroma. Scattered area of subcutaneous gas, likely postsurgical. Musculoskeletal: No acute or significant osseous findings. IMPRESSION: 1. Skin thickening and marked subcutaneous soft tissue edema. Subcutaneous fluid collection about the site of prior C-section incision which may be  postsurgical hematoma/seroma. Scattered area of subcutaneous gas, likely postsurgical. Infectious/inflammatory process can not be completely excluded, clinical correlation is suggested 2. Large heterogeneously enhancing post gravid uterus with fluid in the endometrial canal. Electronically Signed   By: Keane Police D.O.   On: 08/10/2022 23:47     Assessment & Plan:  32 y.o. [id number] POD#7 admitted for incisional cellulitis - Continue Rocephin, Vancomycin and Flagyl.  WBC slightly increased to 22K, but patient afebrile. Follow up blood cultures. - Continue analgesia as needed - Counseled about Venofer for asymptomatic anemia, she agreed. This was ordered - Encouraged OOB for VTE prophylaxis, also ordered prophylactic Lovenox. - Routine postpartum and floor care. - Continue inpatient observation for now until at least 24 hours afebrile, last fever was 103 F at 1800 on 3/11.   Verita Schneiders, MD, [place] for [employer] [facility], [place]

## 2022-08-11 NOTE — Progress Notes (Addendum)
   Results Addendum  CT ABDOMEN PELVIS W CONTRAST  Result Date: 08/10/2022 CLINICAL DATA:  Status post cesarean section, incisional infection. EXAM: CT ABDOMEN AND PELVIS WITH CONTRAST TECHNIQUE: Multidetector CT imaging of the abdomen and pelvis was performed using the standard protocol following bolus administration of intravenous contrast. RADIATION DOSE REDUCTION: This exam was performed according to the departmental dose-optimization program which includes automated exposure control, adjustment of the mA and/or kV according to patient size and/or use of iterative reconstruction technique. CONTRAST:  40mL OMNIPAQUE IOHEXOL 350 MG/ML SOLN COMPARISON:  CT examination dated February 07, 2020 FINDINGS: Lower chest: No acute abnormality. Hepatobiliary: No focal liver abnormality is seen. No gallstones, gallbladder wall thickening, or biliary dilatation. Pancreas: Unremarkable. No pancreatic ductal dilatation or surrounding inflammatory changes. Spleen: Normal in size without focal abnormality. Adrenals/Urinary Tract: Adrenal glands are unremarkable. Kidneys are normal, without renal calculi, focal lesion, or hydronephrosis. Bladder is unremarkable. Stomach/Bowel: Stomach is within normal limits. Appendix not clearly identified. No evidence of bowel wall thickening, distention, or inflammatory changes. Vascular/Lymphatic: No significant vascular findings are present. No enlarged abdominal or pelvic lymph nodes. Reproductive: Large heterogeneously enhancing post gravid uterus with fluid in the endometrial canal. Other: There is skin thickening and marked subcutaneous soft tissue edema. Subcutaneous fluid collection about the site of prior C-section incision which may be postsurgical hematoma/seroma. Scattered area of subcutaneous gas, likely postsurgical. Musculoskeletal: No acute or significant osseous findings. IMPRESSION: 1. Skin thickening and marked subcutaneous soft tissue edema. Subcutaneous fluid  collection about the site of prior C-section incision which may be postsurgical hematoma/seroma. Scattered area of subcutaneous gas, likely postsurgical. Infectious/inflammatory process can not be completely excluded, clinical correlation is suggested 2. Large heterogeneously enhancing post gravid uterus with fluid in the endometrial canal. Electronically Signed   By: Keane Police D.O.   On: 08/10/2022 23:47    I called the Radiology Reading Room but Dr. Genene Churn was already gone for the day. I talked to one of the other radiologists, and we reviewed the images.  The fluid collections noted are very small about 2-3 cm in diameter bilaterally as in the image copied and pasted below, not amenable to drainage at this time.  However, there is impressive subcutaneous inflammation consistent with diffuse cellulitis.      Message was left for Dr. Genene Churn to addend his report with the measurement of the collections, as this will be the baseline in the event patient need future imaging for this condition.   Discussed results with patient, all questions answered.    Will continue antibiotic therapy as ordered and close observation of patient.     Verita Schneiders, MD, [place] for [employer] [facility], [place]

## 2022-08-12 ENCOUNTER — Inpatient Hospital Stay (HOSPITAL_COMMUNITY): Payer: Medicaid Other

## 2022-08-12 ENCOUNTER — Ambulatory Visit: Payer: Medicaid Other

## 2022-08-12 LAB — CBC WITH DIFFERENTIAL/PLATELET
Abs Immature Granulocytes: 0.41 10*3/uL — ABNORMAL HIGH (ref 0.00–0.07)
Basophils Absolute: 0.1 10*3/uL (ref 0.0–0.1)
Basophils Relative: 0 %
Eosinophils Absolute: 0.2 10*3/uL (ref 0.0–0.5)
Eosinophils Relative: 1 %
HCT: 24.6 % — ABNORMAL LOW (ref 36.0–46.0)
Hemoglobin: 8.6 g/dL — ABNORMAL LOW (ref 12.0–15.0)
Immature Granulocytes: 2 %
Lymphocytes Relative: 5 %
Lymphs Abs: 1.3 10*3/uL (ref 0.7–4.0)
MCH: 32.2 pg (ref 26.0–34.0)
MCHC: 35 g/dL (ref 30.0–36.0)
MCV: 92.1 fL (ref 80.0–100.0)
Monocytes Absolute: 2 10*3/uL — ABNORMAL HIGH (ref 0.1–1.0)
Monocytes Relative: 8 %
Neutro Abs: 20.5 10*3/uL — ABNORMAL HIGH (ref 1.7–7.7)
Neutrophils Relative %: 84 %
Platelets: 366 10*3/uL (ref 150–400)
RBC: 2.67 MIL/uL — ABNORMAL LOW (ref 3.87–5.11)
RDW: 13.3 % (ref 11.5–15.5)
Smear Review: NORMAL
WBC: 24.5 10*3/uL — ABNORMAL HIGH (ref 4.0–10.5)
nRBC: 0.1 % (ref 0.0–0.2)

## 2022-08-12 LAB — BASIC METABOLIC PANEL
Anion gap: 9 (ref 5–15)
BUN: 7 mg/dL (ref 6–20)
CO2: 23 mmol/L (ref 22–32)
Calcium: 8.5 mg/dL — ABNORMAL LOW (ref 8.9–10.3)
Chloride: 105 mmol/L (ref 98–111)
Creatinine, Ser: 0.69 mg/dL (ref 0.44–1.00)
GFR, Estimated: >60 mL/min (ref >60)
Glucose, Bld: 107 mg/dL — ABNORMAL HIGH (ref 70–99)
Potassium: 3.4 mmol/L — ABNORMAL LOW (ref 3.5–5.1)
Sodium: 137 mmol/L (ref 135–145)

## 2022-08-12 MED ORDER — POLYETHYLENE GLYCOL 3350 17 G PO PACK
17.0000 g | PACK | Freq: Two times a day (BID) | ORAL | Status: DC
  Administered 2022-08-12 – 2022-08-16 (×6): 17 g via ORAL
  Filled 2022-08-12 (×8): qty 1

## 2022-08-12 MED ORDER — IOHEXOL 9 MG/ML PO SOLN
500.0000 mL | ORAL | Status: AC
  Administered 2022-08-12 (×2): 500 mL via ORAL

## 2022-08-12 MED ORDER — BISACODYL 5 MG PO TBEC
5.0000 mg | DELAYED_RELEASE_TABLET | Freq: Every day | ORAL | Status: AC | PRN
  Administered 2022-08-14 – 2022-08-15 (×2): 5 mg via ORAL
  Filled 2022-08-12 (×2): qty 1

## 2022-08-12 MED ORDER — SODIUM CHLORIDE 0.9 % IV SOLN: INTRAVENOUS | Status: DC

## 2022-08-12 MED ORDER — IOHEXOL 350 MG/ML SOLN
75.0000 mL | Freq: Once | INTRAVENOUS | Status: AC | PRN
  Administered 2022-08-12: 75 mL via INTRAVENOUS

## 2022-08-12 NOTE — Progress Notes (Addendum)
CSW looked for parents at bedside to offer support and assess for needs, concerns, and resources; they were not present at this time.  There were 2 other guest visiting at the time of CSW visit.   CSW called and spoke with MOB via telephone.  Without prompting, MOB shared that she was re-admitted for having an infection. MOB denied having any psychosocial stressors and she reported feeling well informed by NICU team. MOB reported having a good support team that has been visiting with infant.  When CSW assessed for PMADs, MOB disclosed "I cry all the time."  CSW reviewed PMAD education and encouraged MOB to reach out to her OB provider of her symptoms increase or if her symptoms starts to impact her day to day activities; MOB agreed. CSW assessed for safety and MOB denied SI and HI.    CSW will continue to offer support and resources to family while infant remains in NICU.    Laurey Arrow, MSW, LCSW Clinical Social Work [phone]

## 2022-08-12 NOTE — Progress Notes (Signed)
Obstetrics Progress Note  Admission Date: 08/10/2022 Current Date: 08/12/2022 9:13 AM  Kristine Brown is a 32 y.o. [id number] HD#3 readmitted for SSI after stat c/s on 3/5 for abruption at [id number]  History complicated by: Patient Active Problem List   Diagnosis Date Noted   Infected incision 08/10/2022   Sepsis after cesarean section 08/10/2022   Placental abruption, delivered 08/04/2022   Delivery by classical cesarean section 08/04/2022   Alpha thalassemia silent carrier 06/02/2022   Supervision of high risk pregnancy, antepartum 04/29/2022   Depression 11/17/2019   Anxiety 05/12/2018   Asymptomatic HIV infection ([place]) 05/12/2018    ROS and patient/family/surgical history, located on admission H&P note dated 08/10/2022, have been reviewed, and there are no changes except as noted below Yesterday/Overnight Events:  Temp to 38.2 at 2000 yesterday  Subjective:  Pain feels worse although po meds are helping. No BM in 4-5 days, +flatus. Taking PO.   Objective:    Current Vital Signs 24h Vital Sign Ranges  T 98.4 F (36.9 C) Temp  Avg: 99 F (37.2 C)  Min: 98.2 F (36.8 C)  Max: 100.8 F (38.2 C)  BP (!) 104/59 BP  Min: 87/62  Max: 132/69  HR 94 Pulse  Avg: 111  Min: 94  Max: 126  RR 18 Resp  Avg: 18.5  Min: 18  Max: 20  SaO2 96 % Room Air SpO2  Avg: 99 %  Min: 96 %  Max: 100 %       24 Hour I/O Current Shift I/O  Time Ins Outs 03/12 0701 - 03/13 0700 In: 1185.9 [I.V.:444.8] Out: -  No intake/output data recorded.    Patient Vitals for the past 24 hrs:  BP Temp Temp src Pulse Resp SpO2  08/12/22 0809 (!) 104/59 98.4 F (36.9 C) Oral 94 18 96 %  08/12/22 0427 132/69 98.8 F (37.1 C) Oral (!) 103 18 99 %  08/12/22 0030 111/64 98.5 F (36.9 C) Oral (!) 105 18 99 %  08/11/22 2230 -- 98.3 F (36.8 C) Oral -- -- --  08/11/22 2000 -- (!) 100.8 F (38.2 C) Oral -- -- --  08/11/22 1940 (!) 87/62 100.1 F (37.8 C) Oral (!) 124 18 100 %  08/11/22 1849 -- 100 F (37.8 C)  Oral -- -- --  08/11/22 1739 -- 98.3 F (36.8 C) Oral -- -- --  08/11/22 1617 95/63 98.7 F (37.1 C) Oral (!) 114 19 100 %  08/11/22 1229 131/84 98.2 F (36.8 C) Oral (!) 126 20 100 %   Physical exam: General appearance: alert, cooperative, and appears stated age Abdomen:  incision c/d/I, no fluid weeping, skin appears normal but is moderately ttp and tense/indurated at the superior aspect of the incision. No peritoneal s/s. +BS Lungs: clear to auscultation bilaterally Heart: S1, S2 normal, no murmur, rub or gallop, regular rate and rhythm Extremities: no lower extremity edema Skin: warm and dry Psych: appropriate Neurologic: Grossly normal  Medications Current Facility-Administered Medications  Medication Dose Route Frequency Provider Last Rate Last Admin   0.9 %  sodium chloride infusion   Intravenous Continuous Gavin Pound, CNM 100 mL/hr at 08/11/22 1330 New Bag at 08/11/22 1330   acetaminophen (TYLENOL) tablet 650 mg  650 mg Oral Q4H PRN Gavin Pound, CNM   650 mg at 08/12/22 0425   bisacodyl (DULCOLAX) EC tablet 5 mg  5 mg Oral Daily PRN Aletha Halim, MD       cefTRIAXone (ROCEPHIN) 2 g in sodium chloride 0.9 %  100 mL IVPB  2 g Intravenous Q24H Gavin Pound, CNM 200 mL/hr at 08/11/22 2021 2 g at 08/11/22 2021   coconut oil  1 Application Topical PRN Anyanwu, Sallyanne Havers, MD       cyclobenzaprine (FLEXERIL) tablet 10 mg  10 mg Oral TID PRN Anyanwu, Sallyanne Havers, MD       diphenhydrAMINE (BENADRYL) capsule 25-50 mg  25-50 mg Oral Q6H PRN Anyanwu, Sallyanne Havers, MD       dolutegravir (TIVICAY) tablet 50 mg  50 mg Oral Daily Gavin Pound, CNM       emtricitabine-tenofovir AF (DESCOVY) 200-25 MG per tablet 1 tablet  1 tablet Oral Daily Emly, Jessica, CNM       enoxaparin (LOVENOX) injection 40 mg  40 mg Subcutaneous q morning Anyanwu, Ugonna A, MD   40 mg at 08/11/22 1016   HYDROmorphone (DILAUDID) injection 0.2-0.6 mg  0.2-0.6 mg Intravenous Q2H PRN Gavin Pound, CNM   0.6 mg at  08/11/22 0440   hydrOXYzine (ATARAX) tablet 50 mg  50 mg Oral TID PRN Anyanwu, Sallyanne Havers, MD       metroNIDAZOLE (FLAGYL) IVPB 500 mg  500 mg Intravenous Q12H Gavin Pound, CNM 100 mL/hr at 08/11/22 2116 500 mg at 08/11/22 2116   ondansetron (ZOFRAN-ODT) disintegrating tablet 4 mg  4 mg Oral Q6H PRN Anyanwu, Ugonna A, MD       oxyCODONE (Oxy IR/ROXICODONE) immediate release tablet 5 mg  5 mg Oral Q4H PRN Anyanwu, Sallyanne Havers, MD   5 mg at 08/12/22 0425   Or   oxyCODONE (Oxy IR/ROXICODONE) immediate release tablet 10 mg  10 mg Oral Q4H PRN Anyanwu, Ugonna A, MD   10 mg at 08/12/22 0023   polyethylene glycol (MIRALAX / GLYCOLAX) packet 17 g  17 g Oral BID Aletha Halim, MD       potassium chloride SA (KLOR-CON M) CR tablet 20 mEq  20 mEq Oral BID Aletha Halim, MD   20 mEq at 08/11/22 2226   prenatal multivitamin tablet 1 tablet  1 tablet Oral Q1200 Gavin Pound, CNM       vancomycin (VANCOREADY) IVPB 750 mg/150 mL  750 mg Intravenous Q8H Gavin Pound, CNM 150 mL/hr at 08/12/22 0026 750 mg at 08/12/22 0026      Labs  Recent Labs  Lab 08/10/22 1922 08/11/22 0535 08/12/22 0419  WBC 20.5* 22.4* 24.5*  HGB 10.0* 8.8* 8.6*  HCT 28.7* 26.1* 24.6*  PLT 379 358 366    Recent Labs  Lab 08/10/22 1922 08/11/22 0535 08/12/22 0419  NA 131* 135 137  K 3.3* 3.8 3.4*  CL 97* 104 105  CO2 20* 22 23  BUN '7 6 7  '$ CREATININE 0.69 0.59 0.69  CALCIUM 8.9 8.5* 8.5*  PROT 6.6  --   --   BILITOT 0.7  --   --   ALKPHOS 85  --   --   ALT 32  --   --   AST 29  --   --   GLUCOSE 94 112* 107*   BCx: NGTD  Radiology No new imaging ADDENDUM REPORT: 08/11/2022 09:51   ADDENDUM: There are two fluid pockets on each side of the midline which are connected to each other inferiorly, the right pocket measures a proximally 2.5 x 3.1 x 2.7 cm (AP x TR x CC) and the left pocket measures 2.0 x 3.0 x 2.6 cm (AP x TR x CC).   In the midline inferiorly where pockets are connected  to each other, it  measures 1.6 x 9.5 cm (AP x TR).     Electronically Signed   By: Keane Police D.O.   On: 08/11/2022 09:51    Addended by Keane Police, DO on 08/11/2022  9:53 AM    Study Result  Narrative & Impression  CLINICAL DATA:  Status post cesarean section, incisional infection.   EXAM: CT ABDOMEN AND PELVIS WITH CONTRAST   TECHNIQUE: Multidetector CT imaging of the abdomen and pelvis was performed using the standard protocol following bolus administration of intravenous contrast.   RADIATION DOSE REDUCTION: This exam was performed according to the departmental dose-optimization program which includes automated exposure control, adjustment of the mA and/or kV according to patient size and/or use of iterative reconstruction technique.   CONTRAST:  76m OMNIPAQUE IOHEXOL 350 MG/ML SOLN   COMPARISON:  CT examination dated February 07, 2020   FINDINGS: Lower chest: No acute abnormality.   Hepatobiliary: No focal liver abnormality is seen. No gallstones, gallbladder wall thickening, or biliary dilatation.   Pancreas: Unremarkable. No pancreatic ductal dilatation or surrounding inflammatory changes.   Spleen: Normal in size without focal abnormality.   Adrenals/Urinary Tract: Adrenal glands are unremarkable. Kidneys are normal, without renal calculi, focal lesion, or hydronephrosis. Bladder is unremarkable.   Stomach/Bowel: Stomach is within normal limits. Appendix not clearly identified. No evidence of bowel wall thickening, distention, or inflammatory changes.   Vascular/Lymphatic: No significant vascular findings are present. No enlarged abdominal or pelvic lymph nodes.   Reproductive: Large heterogeneously enhancing post gravid uterus with fluid in the endometrial canal.   Other: There is skin thickening and marked subcutaneous soft tissue edema. Subcutaneous fluid collection about the site of prior C-section incision which may be postsurgical hematoma/seroma. Scattered  area of subcutaneous gas, likely postsurgical.   Musculoskeletal: No acute or significant osseous findings.   IMPRESSION: 1. Skin thickening and marked subcutaneous soft tissue edema. Subcutaneous fluid collection about the site of prior C-section incision which may be postsurgical hematoma/seroma. Scattered area of subcutaneous gas, likely postsurgical. Infectious/inflammatory process can not be completely excluded, clinical correlation is suggested 2. Large heterogeneously enhancing post gravid uterus with fluid in the endometrial canal.   Electronically Signed: By: IKeane PoliceD.O. On: 08/10/2022 23:47     Assessment & Plan:  Patient stable *OB: continue IV abx. Patient amenable to re-imaging and d/w her that if amenable to IR drainage, that I would recommend that *HIV: continue home meds *Pain: continue regimen *FEN/GI: SLIV, regular diet *PPx: lovenox *Dispo: not in the near future.  Code Status: Full Code  Total time taking care of the patient was 15 minutes, with greater than 50% of the time spent in face to face interaction with the patient.  CDurene RomansMD [facility]ending [facility] [place] GYN Consult Phone: [phone](M-F, 0800-1700) & [phone](Off hours, weekends, holidays)

## 2022-08-12 NOTE — Lactation Note (Signed)
This note was copied from a baby's chart. Lactation Consultation Note  Patient Name: Kristine Brown [id number] Date: 08/12/2022 Age:34 days   Met with P3 Mom briefly as she was leaving baby's room.  She was re-admitted to [facility] 2 days ago for R/O incision infection due to extreme abdominal pain.  Mom returning to her room for a scan.    [facility] suggested Mom would benefit from a hand's free pumping band.  LC called and spoke with [facility] RN asking her to make one for Mom.   Mom briefly said she is pumping consistently.  Baby is NPO currently and in critical condition.  Broadus John 08/12/2022, 6:20 PM

## 2022-08-13 ENCOUNTER — Other Ambulatory Visit (HOSPITAL_COMMUNITY): Payer: Self-pay

## 2022-08-13 ENCOUNTER — Inpatient Hospital Stay (HOSPITAL_COMMUNITY): Payer: Medicaid Other

## 2022-08-13 LAB — BASIC METABOLIC PANEL
Anion gap: 13 (ref 5–15)
BUN: 8 mg/dL (ref 6–20)
CO2: 18 mmol/L — ABNORMAL LOW (ref 22–32)
Calcium: 8.6 mg/dL — ABNORMAL LOW (ref 8.9–10.3)
Chloride: 105 mmol/L (ref 98–111)
Creatinine, Ser: 0.64 mg/dL (ref 0.44–1.00)
GFR, Estimated: >60 mL/min (ref >60)
Glucose, Bld: 90 mg/dL (ref 70–99)
Potassium: 3.3 mmol/L — ABNORMAL LOW (ref 3.5–5.1)
Sodium: 136 mmol/L (ref 135–145)

## 2022-08-13 LAB — CBC WITH DIFFERENTIAL/PLATELET
Abs Immature Granulocytes: 0.27 10*3/uL — ABNORMAL HIGH (ref 0.00–0.07)
Basophils Absolute: 0.1 10*3/uL (ref 0.0–0.1)
Basophils Relative: 0 %
Eosinophils Absolute: 0.2 10*3/uL (ref 0.0–0.5)
Eosinophils Relative: 1 %
HCT: 24.1 % — ABNORMAL LOW (ref 36.0–46.0)
Hemoglobin: 8.4 g/dL — ABNORMAL LOW (ref 12.0–15.0)
Immature Granulocytes: 1 %
Lymphocytes Relative: 8 %
Lymphs Abs: 1.7 10*3/uL (ref 0.7–4.0)
MCH: 32.1 pg (ref 26.0–34.0)
MCHC: 34.9 g/dL (ref 30.0–36.0)
MCV: 92 fL (ref 80.0–100.0)
Monocytes Absolute: 1.9 10*3/uL — ABNORMAL HIGH (ref 0.1–1.0)
Monocytes Relative: 9 %
Neutro Abs: 17.4 10*3/uL — ABNORMAL HIGH (ref 1.7–7.7)
Neutrophils Relative %: 81 %
Platelets: 372 10*3/uL (ref 150–400)
RBC: 2.62 MIL/uL — ABNORMAL LOW (ref 3.87–5.11)
RDW: 13.5 % (ref 11.5–15.5)
WBC: 21.5 10*3/uL — ABNORMAL HIGH (ref 4.0–10.5)
nRBC: 0 % (ref 0.0–0.2)

## 2022-08-13 LAB — TYPE AND SCREEN
ABO/RH(D): O POS
Antibody Screen: NEGATIVE

## 2022-08-13 LAB — MAGNESIUM: Magnesium: 1.7 mg/dL (ref 1.7–2.4)

## 2022-08-13 MED ORDER — MIDAZOLAM HCL 2 MG/2ML IJ SOLN
INTRAMUSCULAR | Status: AC | PRN
  Administered 2022-08-13 (×2): 1 mg via INTRAVENOUS

## 2022-08-13 MED ORDER — MIDAZOLAM HCL 2 MG/2ML IJ SOLN
INTRAMUSCULAR | Status: AC
  Filled 2022-08-13: qty 2

## 2022-08-13 MED ORDER — FENTANYL CITRATE (PF) 100 MCG/2ML IJ SOLN
INTRAMUSCULAR | Status: AC | PRN
  Administered 2022-08-13 (×2): 25 ug via INTRAVENOUS
  Administered 2022-08-13: 50 ug via INTRAVENOUS

## 2022-08-13 MED ORDER — LIDOCAINE HCL (PF) 1 % IJ SOLN
10.0000 mL | Freq: Once | INTRAMUSCULAR | Status: AC
  Administered 2022-08-13: 10 mL via INTRADERMAL

## 2022-08-13 MED ORDER — FENTANYL CITRATE (PF) 100 MCG/2ML IJ SOLN
INTRAMUSCULAR | Status: AC
  Filled 2022-08-13: qty 2

## 2022-08-13 NOTE — Procedures (Signed)
Interventional Radiology Procedure:   Indications: Post operative subcutaneous abscess at incision  Procedure: US guided drain placement   Findings: 10 Fr drain placed in anterior pelvic subcutaneous abscess. 40 ml of purulent fluid removed.  Fluid sent for culture.  Complications: No immediate complications noted.     EBL: Minimal  Plan: Follow output and cultures.   Aceyn Kathol R. Anselm Pancoast, MD  Pager: [phone]

## 2022-08-13 NOTE — H&P (Signed)
Chief Complaint: Patient was seen in consultation today for abdominal fluid collection at caesarian section site from 3/5.    Referring Physician(s): Anyanwu,Ugonna A  Supervising Physician: Markus Daft  Patient Status: [facility] - In-pt  History of Present Illness: Kristine Brown is a 32 y.o. female with PMH significant for anxiety, asthma, depression, and HIV with undetectable viral load last tested in 12/23 being seen today for image-guided abdominal drain placement. The patient underwent emergent C-section on 08/04/22. Patient was discharged home on 3/8, and reported to [facility] on 3/11 with concerns of fever, chills, drainage from abdominal c-section wound. IR was consulted on 3/14 to evaluate patient for image-guided abdominal drain placement.  Past Medical History:  Diagnosis Date   Anxiety    Asthma    Depression    HIV (human immunodeficiency virus infection) ([place])     Past Surgical History:  Procedure Laterality Date   CESAREAN SECTION N/A 08/04/2022   Procedure: CESAREAN SECTION;  Surgeon: Truett Mainland, DO;  Location: [facility] LD ORS;  Service: Obstetrics;  Laterality: N/A;   NO PAST SURGERIES      Allergies: Patient has no known allergies.  Medications: Prior to Admission medications   Medication Sig Start Date End Date Taking? Authorizing Provider  acetaminophen (TYLENOL) 500 MG tablet Take 2 tablets (1,000 mg total) by mouth every 6 (six) hours as needed. 04/07/22 04/07/23 Yes Rasch, Artist Pais, NP  dolutegravir (TIVICAY) 50 MG tablet Take 1 tablet (50 mg total) by mouth daily. 04/06/22  Yes Golden Circle, FNP  emtricitabine-tenofovir AF (DESCOVY) 200-25 MG tablet Take 1 tablet by mouth daily. 04/06/22  Yes Golden Circle, FNP  ferrous sulfate 325 (65 FE) MG tablet Take 1 tablet (325 mg total) by mouth every other day. 08/07/22  Yes Griffin Basil, MD  ibuprofen (ADVIL) 600 MG tablet Take 1 tablet (600 mg total) by mouth every 6 (six) hours. 08/07/22  Yes Griffin Basil, MD  oxyCODONE (OXY IR/ROXICODONE) 5 MG immediate release tablet Take 1 tablet (5 mg total) by mouth every 4 (four) hours as needed for moderate pain. 08/07/22  Yes Griffin Basil, MD  albuterol (VENTOLIN HFA) 108 (90 Base) MCG/ACT inhaler Inhale into the lungs. 08/20/20   [provider]     Family History  Problem Relation Age of Onset   Healthy Mother    Cancer Father    COPD Father    Healthy Daughter    Healthy Son    Asthma Neg Hx    Diabetes Neg Hx    Hearing loss Neg Hx    Hyperlipidemia Neg Hx    Stroke Neg Hx     Social History   Socioeconomic History   Marital status: Single    Spouse name: Not on file   Number of children: Not on file   Years of education: Not on file   Highest education level: Not on file  Occupational History   Not on file  Tobacco Use   Smoking status: Former    Types: Cigarettes   Smokeless tobacco: Never  Vaping Use   Vaping Use: Never used  Substance and Sexual Activity   Alcohol use: Never   Drug use: Not Currently    Types: Marijuana   Sexual activity: Yes    Birth control/protection: None    Comment: pt given condoms  Other Topics Concern   Not on file  Social History Narrative   Not on file   Social Determinants of  Health   Financial Resource Strain: Not on file  Food Insecurity: Not on file  Transportation Needs: Not on file  Physical Activity: Not on file  Stress: Not on file  Social Connections: Not on file    Code status: Full code  Review of Systems: A 12 point ROS discussed and pertinent positives are indicated in the HPI above.  All other systems are negative.  Review of Systems  Constitutional:  Positive for chills and fever.  Respiratory:  Negative for cough, chest tightness and shortness of breath.   Cardiovascular:  Negative for chest pain and leg swelling.  Gastrointestinal:  Positive for abdominal pain and constipation. Negative for diarrhea, nausea and vomiting.  Musculoskeletal:         Wound in lower abdomen that is actively draining a cream-colored purulent material  Neurological:  Positive for headaches. Negative for dizziness.  Psychiatric/Behavioral:  Negative for confusion.     Vital Signs: BP 123/81 (BP Location: Left Arm)   Pulse (!) 107   Temp 99.5 F (37.5 C) (Oral)   Resp 20   SpO2 99%     Physical Exam Vitals reviewed.  Constitutional:      General: She is not in acute distress.    Appearance: She is ill-appearing.  HENT:     Mouth/Throat:     Mouth: Mucous membranes are moist.  Cardiovascular:     Rate and Rhythm: Regular rhythm. Tachycardia present.     Pulses: Normal pulses.     Heart sounds: Normal heart sounds.  Pulmonary:     Effort: Pulmonary effort is normal.     Breath sounds: Normal breath sounds.  Abdominal:     Palpations: Abdomen is soft.     Tenderness: There is abdominal tenderness.     Comments: Drainage of cream-colored purulence visualized from c-section site  Musculoskeletal:     Right lower leg: No edema.     Left lower leg: No edema.  Skin:    General: Skin is warm and dry.  Neurological:     Mental Status: She is alert and oriented to person, place, and time.  Psychiatric:        Mood and Affect: Mood normal.        Behavior: Behavior normal.        Thought Content: Thought content normal.        Judgment: Judgment normal.     Imaging: CT ABDOMEN PELVIS W CONTRAST  Result Date: 08/12/2022 CLINICAL DATA:  Status post Caesarean section on 08/04/2022. Incisional pain and cellulitis with reassessment of subcutaneous fluid. EXAM: CT ABDOMEN AND PELVIS WITH CONTRAST TECHNIQUE: Multidetector CT imaging of the abdomen and pelvis was performed using the standard protocol following bolus administration of intravenous contrast. RADIATION DOSE REDUCTION: This exam was performed according to the departmental dose-optimization program which includes automated exposure control, adjustment of the mA and/or kV according to patient  size and/or use of iterative reconstruction technique. CONTRAST:  37m OMNIPAQUE IOHEXOL 350 MG/ML SOLN COMPARISON:  08/10/2022 FINDINGS: Lower chest: No acute abnormality. Hepatobiliary: No focal liver abnormality is seen. No gallstones, gallbladder wall thickening, or biliary dilatation. Pancreas: Unremarkable. No pancreatic ductal dilatation or surrounding inflammatory changes. Spleen: Normal in size without focal abnormality. Adrenals/Urinary Tract: Adrenal glands are unremarkable. Kidneys are normal, without renal calculi, focal lesion, or hydronephrosis. Bladder is unremarkable. Stomach/Bowel: Bowel shows no evidence of obstruction, ileus, inflammation or lesion. The appendix is normal. No free intraperitoneal air. Vascular/Lymphatic: No significant vascular findings are present. No  enlarged abdominal or pelvic lymph nodes. Reproductive: Stable appearance of recent gravid uterus. Other: Stable appearance of U-shaped subcutaneous fluid in the lower abdominal wall fat measuring up to approximately 2.3 cm in thickness to the right of midline and 2.6 cm to the left of midline. Fluid does not show significant increase in size or peripheral enhancement. Degree of surrounding inflammation in the subcutaneous fat and overlying skin thickening may be slightly more pronounced by CT compared to the prior scan which may be consistent with some interval worsening of cellulitis. No new fluid collections are identified. Musculoskeletal: No acute or significant osseous findings. IMPRESSION: Stable appearance of U-shaped subcutaneous fluid in the lower abdominal wall fat measuring up to approximately 2.3 cm in thickness to the right of midline and 2.6 cm to the left of midline. Fluid does not show significant increase in size or peripheral enhancement. Degree of surrounding inflammation in the subcutaneous fat and overlying skin thickening may be slightly more pronounced by CT compared to the prior scan which may be consistent  with some interval worsening of cellulitis. No new fluid collections are identified. Electronically Signed   By: Aletta Edouard M.D.   On: 08/12/2022 17:07   CT ABDOMEN PELVIS W CONTRAST  Addendum Date: 08/11/2022   ADDENDUM REPORT: 08/11/2022 09:51 ADDENDUM: There are two fluid pockets on each side of the midline which are connected to each other inferiorly, the right pocket measures a proximally 2.5 x 3.1 x 2.7 cm (AP x TR x CC) and the left pocket measures 2.0 x 3.0 x 2.6 cm (AP x TR x CC). In the midline inferiorly where pockets are connected to each other, it measures 1.6 x 9.5 cm (AP x TR). Electronically Signed   By: Keane Police D.O.   On: 08/11/2022 09:51   Result Date: 08/11/2022 CLINICAL DATA:  Status post cesarean section, incisional infection. EXAM: CT ABDOMEN AND PELVIS WITH CONTRAST TECHNIQUE: Multidetector CT imaging of the abdomen and pelvis was performed using the standard protocol following bolus administration of intravenous contrast. RADIATION DOSE REDUCTION: This exam was performed according to the departmental dose-optimization program which includes automated exposure control, adjustment of the mA and/or kV according to patient size and/or use of iterative reconstruction technique. CONTRAST:  40m OMNIPAQUE IOHEXOL 350 MG/ML SOLN COMPARISON:  CT examination dated February 07, 2020 FINDINGS: Lower chest: No acute abnormality. Hepatobiliary: No focal liver abnormality is seen. No gallstones, gallbladder wall thickening, or biliary dilatation. Pancreas: Unremarkable. No pancreatic ductal dilatation or surrounding inflammatory changes. Spleen: Normal in size without focal abnormality. Adrenals/Urinary Tract: Adrenal glands are unremarkable. Kidneys are normal, without renal calculi, focal lesion, or hydronephrosis. Bladder is unremarkable. Stomach/Bowel: Stomach is within normal limits. Appendix not clearly identified. No evidence of bowel wall thickening, distention, or inflammatory  changes. Vascular/Lymphatic: No significant vascular findings are present. No enlarged abdominal or pelvic lymph nodes. Reproductive: Large heterogeneously enhancing post gravid uterus with fluid in the endometrial canal. Other: There is skin thickening and marked subcutaneous soft tissue edema. Subcutaneous fluid collection about the site of prior C-section incision which may be postsurgical hematoma/seroma. Scattered area of subcutaneous gas, likely postsurgical. Musculoskeletal: No acute or significant osseous findings. IMPRESSION: 1. Skin thickening and marked subcutaneous soft tissue edema. Subcutaneous fluid collection about the site of prior C-section incision which may be postsurgical hematoma/seroma. Scattered area of subcutaneous gas, likely postsurgical. Infectious/inflammatory process can not be completely excluded, clinical correlation is suggested 2. Large heterogeneously enhancing post gravid uterus with fluid in the endometrial  canal. Electronically Signed: By: Keane Police D.O. On: 08/10/2022 23:47    Labs:  CBC: Recent Labs    08/10/22 1922 08/11/22 0535 08/12/22 0419 08/13/22 0513  WBC 20.5* 22.4* 24.5* 21.5*  HGB 10.0* 8.8* 8.6* 8.4*  HCT 28.7* 26.1* 24.6* 24.1*  PLT 379 358 366 372    COAGS: No results for input(s): "INR", "APTT" in the last 8760 hours.  BMP: Recent Labs    08/10/22 1922 08/11/22 0535 08/12/22 0419 08/13/22 0513  NA 131* 135 137 136  K 3.3* 3.8 3.4* 3.3*  CL 97* 104 105 105  CO2 20* 22 23 18*  GLUCOSE 94 112* 107* 90  BUN '7 6 7 8  '$ CALCIUM 8.9 8.5* 8.5* 8.6*  CREATININE 0.69 0.59 0.69 0.64  GFRNONAA >60 >60 >60 >60    LIVER FUNCTION TESTS: Recent Labs    04/07/22 0730 04/09/22 0921 06/01/22 2234 08/10/22 1922  BILITOT 0.3 0.5 0.3 0.7  AST '21 21 20 29  '$ ALT '14 19 15 '$ 32  ALKPHOS 41 45 43 85  PROT 7.6 7.4 6.5 6.6  ALBUMIN 4.0 4.2 3.3* 2.6*    TUMOR MARKERS: No results for input(s): "AFPTM", "CEA", "CA199", "CHROMGRNA" in the last  8760 hours.  Assessment and Plan:  Kristine Brown is a 32 yo female being seen today in relation to a post-operative abdominal fluid collection at the site of the patient's caesarian section from 08/04/22. Concern is for infection at the operative site. Case has been reviewed and approved by Dr Anselm Pancoast. Patient is NPO since midnight. Patient eager to have her drain placed so she can spend time with her baby in the NICU.   Risks and benefits discussed with the patient including bleeding, infection, damage to adjacent structures, bowel perforation/fistula connection, and sepsis.  All of the patient's questions were answered, patient is agreeable to proceed. Consent signed and in IR suite.   Thank you for this interesting consult.  I greatly enjoyed meeting Kristine Brown and look forward to participating in their care.  A copy of this report was sent to the requesting provider on this date.  Electronically Signed: Lura Em, PA-C 08/13/2022, 11:32 AM   I spent a total of 40 Minutes    in face to face in clinical consultation, greater than 50% of which was counseling/coordinating care for abdominal fluid collection.

## 2022-08-13 NOTE — Lactation Note (Signed)
This note was copied from a baby's chart.  NICU Lactation Consultation Note  Patient Name: Kristine Brown [id number] Date: 08/13/2022 Age:34 days  Reason for consult: Follow-up assessment; NICU baby; Preterm <34wks; Infant < 6lbs; Other (Comment) (maternal HIV (+), readmission to [facility] Specialty care)  Subjective Visited with family of 5 49/44 weeks old (adjusted) NICU female; Kristine Brown is a P3 and got readmitted to [facility] specialty care. She has been pumping but not consistently due to her health status; she voiced that she's saving all that milk to take home to her baby once he gets discharged because he's not allowed to have his mother's milk due to most recent maternal HIV (+) viral load of ~30 at time of delivery. (See provider's note on 08/12/2022). Provided a pumping top in size "L" per her request for hands on pumping. Noticed that she's been combining two pumping sessions in one bottle, explained best practices for breastmilk storage guidelines. Reviewed pumping schedule, lactogenesis III and anticipatory guidelines.  Objective Infant data: Mother's Current Feeding Choice: -- (NPO)  Maternal data: [id number]  C-Section, Classical Pumping frequency: 4 times/24 hours Pumped volume: 30 mL Flange Size: 24 WIC Program: No WIC Referral Sent?: Yes Pump:  (No pump at home, [facility] referral sent on admission)  Assessment Infant: Feeding Status: NPO  Maternal: Milk volume: Low Breast are soft, no S/S of engorgement at this time  Intervention/Plan Interventions: Breast feeding basics reviewed; DEBP; Education; Coconut oil  Tools: Pump; Flanges; Hands-free pumping top (Size "L") Pump Education: Setup, frequency, and cleaning; Milk Storage  Plan of care: Encouraged to continue pumping every 3 hours, ideally 8 pumping sessions/24 hours; but she might also go at her own pace Breast massage, hand expression and coconut oil were also encouraged prior pumping   Female visitor present and  supportive. All questions and concerns answered, family to contact [facility] services PRN.   Consult Status: NICU follow-up  NICU Follow-up type: Weekly NICU follow up   [place] 08/13/2022, 5:46 PM

## 2022-08-13 NOTE — Progress Notes (Signed)
[facility] NOTE  Kristine Brown Kristine Brown is a 32 y.o. [id number] who is POD9 s/p emergent classical cesarean section for placental abruption, now readmitted for superficial surgical site infection   Length of Stay:  3 Days. Admitted 08/10/2022  Subjective: Is s/p placement of subcutaneous drain w/ IR this afternoon. Drained 40cc of purulent fluid that was sent for culture. She has a 10 Fr drain in place.   Feeling a little bit better. Incision started spontaneously draining some pus earlier today. Reports pain is overall controlled with current regimen. Has good appetite and is looking forward to ordering lunch. No n/v, f/c.   Reporte frustration with communication between our team, IR, and nursing staff. Feeling better now that her procedure is completed and went well.   Vitals:  Blood pressure 115/72, pulse 85, temperature 99.5 F (37.5 C), temperature source Oral, resp. rate 15, SpO2 99 %, unknown if currently breastfeeding. Physical Examination: CONSTITUTIONAL: Well-developed, well-nourished female in no acute distress.  NEUROLOGIC: Alert and oriented to person, place, and time.  PSYCHIATRIC: Normal mood and affect. Normal behavior. Normal judgment and thought content. CARDIOVASCULAR: Normal heart rate noted RESPIRATORY: Effort normal, no problems with respiration noted ABDOMEN: Soft, nondistended. RLQ subcutaneous drain in place w/ no fluid in the bulb, covered with gauze that is clean/dry/intact. Remainder of incision is indurated & tender with small amount of purulent drainage on left side  Results for orders placed or performed during the hospital encounter of 08/10/22 (from the past 48 hour(s))  CBC with Differential/Platelet     Status: Abnormal   Collection Time: 08/12/22  4:19 AM  Result Value Ref Range   WBC 24.5 (H) 4.0 - 10.5 K/uL   RBC 2.67 (L) 3.87 - 5.11 MIL/uL   Hemoglobin 8.6 (L) 12.0 - 15.0 g/dL   HCT 24.6 (L) 36.0 - 46.0 %   MCV 92.1 80.0 - 100.0  fL   MCH 32.2 26.0 - 34.0 pg   MCHC 35.0 30.0 - 36.0 g/dL   RDW 13.3 11.5 - 15.5 %   Platelets 366 150 - 400 K/uL   nRBC 0.1 0.0 - 0.2 %   Neutrophils Relative % 84 %   Neutro Abs 20.5 (H) 1.7 - 7.7 K/uL   Lymphocytes Relative 5 %   Lymphs Abs 1.3 0.7 - 4.0 K/uL   Monocytes Relative 8 %   Monocytes Absolute 2.0 (H) 0.1 - 1.0 K/uL   Eosinophils Relative 1 %   Eosinophils Absolute 0.2 0.0 - 0.5 K/uL   Basophils Relative 0 %   Basophils Absolute 0.1 0.0 - 0.1 K/uL   WBC Morphology MORPHOLOGY UNREMARKABLE    RBC Morphology MORPHOLOGY UNREMARKABLE    Smear Review Normal platelet morphology    Immature Granulocytes 2 %   Abs Immature Granulocytes 0.41 (H) 0.00 - 0.07 K/uL    Comment: Performed at [facility], [address], [place], [place] [id number]  Basic metabolic panel     Status: Abnormal   Collection Time: 08/12/22  4:19 AM  Result Value Ref Range   Sodium 137 135 - 145 mmol/L   Potassium 3.4 (L) 3.5 - 5.1 mmol/L   Chloride 105 98 - 111 mmol/L   CO2 23 22 - 32 mmol/L   Glucose, Bld 107 (H) 70 - 99 mg/dL    Comment: Glucose reference range applies only to samples taken after fasting for at least 8 hours.   BUN 7 6 - 20 mg/dL   Creatinine, Ser 0.69 0.44 -  1.00 mg/dL   Calcium 8.5 (L) 8.9 - 10.3 mg/dL   GFR, Estimated >60 >60 mL/min    Comment: (NOTE) Calculated using the CKD-EPI Creatinine Equation (2021)    Anion gap 9 5 - 15    Comment: Performed at [facility] [address] [place], [place] [address]  Type and screen [place]     Status: None   Collection Time: 08/13/22  5:08 AM  Result Value Ref Range   ABO/RH(D) O POS    Antibody Screen NEG    Sample Expiration      08/16/2022,2359 Performed at [facility], [place] [address], [place], [place] [address]   CBC with Differential/Platelet     Status: Abnormal   Collection Time: 08/13/22  5:13 AM  Result Value Ref Range   WBC 21.5 (H) 4.0 - 10.5 K/uL   RBC 2.62 (L) 3.87 -  5.11 MIL/uL   Hemoglobin 8.4 (L) 12.0 - 15.0 g/dL   HCT 24.1 (L) 36.0 - 46.0 %   MCV 92.0 80.0 - 100.0 fL   MCH 32.1 26.0 - 34.0 pg   MCHC 34.9 30.0 - 36.0 g/dL   RDW 13.5 11.5 - 15.5 %   Platelets 372 150 - 400 K/uL   nRBC 0.0 0.0 - 0.2 %   Neutrophils Relative % 81 %   Neutro Abs 17.4 (H) 1.7 - 7.7 K/uL   Lymphocytes Relative 8 %   Lymphs Abs 1.7 0.7 - 4.0 K/uL   Monocytes Relative 9 %   Monocytes Absolute 1.9 (H) 0.1 - 1.0 K/uL   Eosinophils Relative 1 %   Eosinophils Absolute 0.2 0.0 - 0.5 K/uL   Basophils Relative 0 %   Basophils Absolute 0.1 0.0 - 0.1 K/uL   Immature Granulocytes 1 %   Abs Immature Granulocytes 0.27 (H) 0.00 - 0.07 K/uL    Comment: Performed at [facility], [address], [place], [place] [id number]  Basic metabolic panel     Status: Abnormal   Collection Time: 08/13/22  5:13 AM  Result Value Ref Range   Sodium 136 135 - 145 mmol/L   Potassium 3.3 (L) 3.5 - 5.1 mmol/L   Chloride 105 98 - 111 mmol/L   CO2 18 (L) 22 - 32 mmol/L   Glucose, Bld 90 70 - 99 mg/dL    Comment: Glucose reference range applies only to samples taken after fasting for at least 8 hours.   BUN 8 6 - 20 mg/dL   Creatinine, Ser 0.64 0.44 - 1.00 mg/dL   Calcium 8.6 (L) 8.9 - 10.3 mg/dL   GFR, Estimated >60 >60 mL/min    Comment: (NOTE) Calculated using the CKD-EPI Creatinine Equation (2021)    Anion gap 13 5 - 15    Comment: Performed at [facility] [address] [place], [place] [address]  Magnesium     Status: None   Collection Time: 08/13/22  5:13 AM  Result Value Ref Range   Magnesium 1.7 1.7 - 2.4 mg/dL    Comment: Performed at [facility] [address], [place], [place] [address]    CT ABDOMEN PELVIS W CONTRAST  Result Date: 08/12/2022 CLINICAL DATA:  Status post Caesarean section on 08/04/2022. Incisional pain and cellulitis with reassessment of subcutaneous fluid. EXAM: CT ABDOMEN AND PELVIS WITH CONTRAST TECHNIQUE: Multidetector CT imaging of  the abdomen and pelvis was performed using the standard protocol following bolus administration of intravenous contrast.  RADIATION DOSE REDUCTION: This exam was performed according to the departmental dose-optimization program which includes automated exposure control, adjustment of the mA and/or kV according to patient size and/or use of iterative reconstruction technique. CONTRAST:  39m OMNIPAQUE IOHEXOL 350 MG/ML SOLN COMPARISON:  08/10/2022 FINDINGS: Lower chest: No acute abnormality. Hepatobiliary: No focal liver abnormality is seen. No gallstones, gallbladder wall thickening, or biliary dilatation. Pancreas: Unremarkable. No pancreatic ductal dilatation or surrounding inflammatory changes. Spleen: Normal in size without focal abnormality. Adrenals/Urinary Tract: Adrenal glands are unremarkable. Kidneys are normal, without renal calculi, focal lesion, or hydronephrosis. Bladder is unremarkable. Stomach/Bowel: Bowel shows no evidence of obstruction, ileus, inflammation or lesion. The appendix is normal. No free intraperitoneal air. Vascular/Lymphatic: No significant vascular findings are present. No enlarged abdominal or pelvic lymph nodes. Reproductive: Stable appearance of recent gravid uterus. Other: Stable appearance of U-shaped subcutaneous fluid in the lower abdominal wall fat measuring up to approximately 2.3 cm in thickness to the right of midline and 2.6 cm to the left of midline. Fluid does not show significant increase in size or peripheral enhancement. Degree of surrounding inflammation in the subcutaneous fat and overlying skin thickening may be slightly more pronounced by CT compared to the prior scan which may be consistent with some interval worsening of cellulitis. No new fluid collections are identified. Musculoskeletal: No acute or significant osseous findings. IMPRESSION: Stable appearance of U-shaped subcutaneous fluid in the lower abdominal wall fat measuring up to approximately 2.3 cm in  thickness to the right of midline and 2.6 cm to the left of midline. Fluid does not show significant increase in size or peripheral enhancement. Degree of surrounding inflammation in the subcutaneous fat and overlying skin thickening may be slightly more pronounced by CT compared to the prior scan which may be consistent with some interval worsening of cellulitis. No new fluid collections are identified. Electronically Signed   By: GAletta EdouardM.D.   On: 08/12/2022 17:07    Current scheduled medications  dolutegravir  50 mg Oral Daily   emtricitabine-tenofovir AF  1 tablet Oral Daily   polyethylene glycol  17 g Oral BID   potassium chloride  20 mEq Oral BID   prenatal multivitamin  1 tablet Oral Q1200   I have reviewed the patient's current medications.  ASSESSMENT: Principal Problem:   Infected incision Active Problems:   [place]omatic HIV infection ([place]   Depression   Delivery by classical cesarean section   Sepsis after cesarean section   PLAN: SSI - subcutaneous abscess & overlying cellulitis - Tlast 38.2 3/12 @ 2000; leukocytosis improving (21.5 this AM) - s/p IR drainage of subcutaneous fluid collection on 3/14 - continue IV Vanc/CTX/flagyl - will narrow pending culture data - if pt is not continuing to clinically improve over the next 24-48 hours, will do wound washout of subcutaneous tissue w/ plan for packing or wound vac depending on findings  2. HIV - continue home meds  3. Postoperative anemia - s/p IV iron 3/12  4. Hypokalemia - repletion ordered (PO 211m BID)  5. Postpartum - infant critical in NICU - pumping - lactation consult ordered  Ppx: SCDs, resume pLov tomorrow AM Dispo: continue inpatient care  KyGale JourneyMDToptonFaSouth [facility] Sp[facility] Hospitalor WoDe[employer] CompanyCoChula [place]

## 2022-08-14 ENCOUNTER — Ambulatory Visit: Payer: Medicaid Other

## 2022-08-14 LAB — CBC WITH DIFFERENTIAL/PLATELET
Abs Immature Granulocytes: 0 10*3/uL (ref 0.00–0.07)
Basophils Absolute: 0 10*3/uL (ref 0.0–0.1)
Basophils Relative: 0 %
Eosinophils Absolute: 0.7 10*3/uL — ABNORMAL HIGH (ref 0.0–0.5)
Eosinophils Relative: 4 %
HCT: 24.7 % — ABNORMAL LOW (ref 36.0–46.0)
Hemoglobin: 8.6 g/dL — ABNORMAL LOW (ref 12.0–15.0)
Lymphocytes Relative: 9 %
Lymphs Abs: 1.5 10*3/uL (ref 0.7–4.0)
MCH: 32.2 pg (ref 26.0–34.0)
MCHC: 34.8 g/dL (ref 30.0–36.0)
MCV: 92.5 fL (ref 80.0–100.0)
Monocytes Absolute: 0.8 10*3/uL (ref 0.1–1.0)
Monocytes Relative: 5 %
Neutro Abs: 13.4 10*3/uL — ABNORMAL HIGH (ref 1.7–7.7)
Neutrophils Relative %: 82 %
Platelets: 480 10*3/uL — ABNORMAL HIGH (ref 150–400)
RBC: 2.67 MIL/uL — ABNORMAL LOW (ref 3.87–5.11)
RDW: 13.4 % (ref 11.5–15.5)
WBC: 16.4 10*3/uL — ABNORMAL HIGH (ref 4.0–10.5)
nRBC: 0 /100 WBC
nRBC: 0.4 % — ABNORMAL HIGH (ref 0.0–0.2)

## 2022-08-14 LAB — BASIC METABOLIC PANEL
Anion gap: 9 (ref 5–15)
BUN: 8 mg/dL (ref 6–20)
CO2: 20 mmol/L — ABNORMAL LOW (ref 22–32)
Calcium: 8.8 mg/dL — ABNORMAL LOW (ref 8.9–10.3)
Chloride: 106 mmol/L (ref 98–111)
Creatinine, Ser: 0.78 mg/dL (ref 0.44–1.00)
GFR, Estimated: >60 mL/min (ref >60)
Glucose, Bld: 146 mg/dL — ABNORMAL HIGH (ref 70–99)
Potassium: 3.8 mmol/L (ref 3.5–5.1)
Sodium: 135 mmol/L (ref 135–145)

## 2022-08-14 LAB — VANCOMYCIN, PEAK: Vancomycin Pk: 23 ug/mL — ABNORMAL LOW (ref 30–40)

## 2022-08-14 MED ORDER — ENOXAPARIN SODIUM 40 MG/0.4ML IJ SOSY
40.0000 mg | PREFILLED_SYRINGE | INTRAMUSCULAR | Status: DC
  Administered 2022-08-14: 40 mg via SUBCUTANEOUS
  Filled 2022-08-14 (×5): qty 0.4

## 2022-08-14 NOTE — Progress Notes (Addendum)
Patient ID: Kristine Brown, female   DOB: Jan 05, 1991, 32 y.o.   MRN: [id number]   Assessment/Plan: Principal Problem:   Infected incision Active Problems:   Asymptomatic HIV infection ([place])   Depression   Delivery by classical cesarean section   Sepsis after cesarean section  Continue IV Abx Cultures is growin staph aureus and sensitivities are pending.  Subjective: Interval History:still having pain. Increasing swelling in her abdomen. Drain put out 40 cc total. New purulent drainage at incision.  Objective: Vital signs in last 24 hours: Temp:  [98.2 F (36.8 C)-100.3 F (37.9 C)] 98.5 F (36.9 C) (03/15 0748) Pulse Rate:  [81-107] 91 (03/15 0748) Resp:  [13-20] 19 (03/15 0748) BP: (107-128)/(56-82) 126/76 (03/15 0748) SpO2:  [98 %-100 %] 98 % (03/15 0748)  Intake/Output from previous day: 03/14 0701 - 03/15 0700 In: 1041.8  Out: 40 [Drains:40] Intake/Output this shift: No intake/output data recorded.  General appearance: alert, cooperative, and appears stated age Head: Normocephalic, without obvious abnormality, atraumatic Neck: supple, symmetrical, trachea midline Lungs: normal effort Heart: regular rate and rhythm Abdomen: induration noted from surgical incision to umbilicus with edema. Purulent drainage from incision site Extremities: extremities normal, atraumatic, no cyanosis or edema  Results for orders placed or performed during the hospital encounter of 08/10/22 (from the past 24 hour(s))  Aerobic/Anaerobic Culture w Gram Stain (surgical/deep wound)     Status: None (Preliminary result)   Collection Time: 08/13/22  1:44 PM   Specimen: Abscess  Result Value Ref Range   Specimen Description ABSCESS    Special Requests NONE    Gram Stain      ABUNDANT WBC PRESENT, PREDOMINANTLY PMN FEW GRAM POSITIVE COCCI IN CLUSTERS    Culture      ABUNDANT STAPHYLOCOCCUS AUREUS SUSCEPTIBILITIES TO FOLLOW Performed at [facility], [address]  [place], [place] [address]    Report Status PENDING   CBC with Differential/Platelet     Status: Abnormal   Collection Time: 08/14/22  5:25 AM  Result Value Ref Range   WBC 16.4 (H) 4.0 - 10.5 K/uL   RBC 2.67 (L) 3.87 - 5.11 MIL/uL   Hemoglobin 8.6 (L) 12.0 - 15.0 g/dL   HCT 24.7 (L) 36.0 - 46.0 %   MCV 92.5 80.0 - 100.0 fL   MCH 32.2 26.0 - 34.0 pg   MCHC 34.8 30.0 - 36.0 g/dL   RDW 13.4 11.5 - 15.5 %   Platelets 480 (H) 150 - 400 K/uL   nRBC 0.4 (H) 0.0 - 0.2 %   Neutrophils Relative % 82 %   Neutro Abs 13.4 (H) 1.7 - 7.7 K/uL   Lymphocytes Relative 9 %   Lymphs Abs 1.5 0.7 - 4.0 K/uL   Monocytes Relative 5 %   Monocytes Absolute 0.8 0.1 - 1.0 K/uL   Eosinophils Relative 4 %   Eosinophils Absolute 0.7 (H) 0.0 - 0.5 K/uL   Basophils Relative 0 %   Basophils Absolute 0.0 0.0 - 0.1 K/uL   nRBC 0 0 /100 WBC   Abs Immature Granulocytes 0.00 0.00 - 0.07 K/uL  Basic metabolic panel     Status: Abnormal   Collection Time: 08/14/22  5:25 AM  Result Value Ref Range   Sodium 135 135 - 145 mmol/L   Potassium 3.8 3.5 - 5.1 mmol/L   Chloride 106 98 - 111 mmol/L   CO2 20 (L) 22 - 32 mmol/L   Glucose, Bld 146 (H) 70 - 99 mg/dL   BUN 8  6 - 20 mg/dL   Creatinine, Ser 0.78 0.44 - 1.00 mg/dL   Calcium 8.8 (L) 8.9 - 10.3 mg/dL   GFR, Estimated >60 >60 mL/min   Anion gap 9 5 - 15    Studies/Results: [place] Abscess Drain  Result Date: 08/13/2022 INDICATION: 32 year old with postoperative fluid collection at the C-section incision. Patient has brown drainage at the incision. Recent CT imaging demonstrates subcutaneous fluid deep to the incision. EXAM: Ultrasound-guided placement of drainage catheter in subcutaneous fluid collection MEDICATIONS: Moderate sedation ANESTHESIA/SEDATION: Moderate (conscious) sedation was employed during this procedure. A total of Versed 2mg  and fentanyl 100 mcg was administered intravenously at the order of the provider performing the procedure. Total intra-service moderate  sedation time: 20 minutes. Patient's level of consciousness and vital signs were monitored continuously by radiology nurse throughout the procedure under the supervision of the provider performing the procedure. COMPLICATIONS: None immediate. PROCEDURE: Informed written consent was obtained from the patient after a thorough discussion of the procedural risks, benefits and alternatives. All questions were addressed. Maximal Sterile Barrier Technique was utilized including caps, mask, sterile gowns, sterile gloves, sterile drape, hand hygiene and skin antiseptic. A timeout was performed prior to the initiation of the procedure. Anterior pelvic region was evaluated with ultrasound. Heterogeneous fluid collection identified in the deep subcutaneous tissues in the right and left lower quadrants. Right lower quadrant fluid component appeared to be larger. The right side of the lower abdomen and pelvis was prepped with Betadine and sterile field was created. Skin was anesthetized with 1% lidocaine. Small incision was made. Using ultrasound guidance, a Yueh catheter was directed into the fluid collection and purulent looking brown fluid was aspirated. Superstiff Amplatz wire was placed and a 10 French drain was placed over the wire. 40 mL of brown purulent fluid was removed. Fluid collection appear to be significantly smaller following drain placement even on the left side. Drain was sutured to skin and attached to suction bulb. Fluid was sent for culture. Bandage was placed. FINDINGS: Heterogeneous fluid in the deep subcutaneous tissues in the anterior lower abdomen and pelvis near the surgical incision. Fluid collection was slightly larger on the right side than the left. Fluid collections decreased in size following placement of the right lower quadrant subcutaneous drain. IMPRESSION: Ultrasound-guided placement of a drainage catheter in the right anterior pelvic subcutaneous abscess. Fluid was sent for culture.  Electronically Signed   By: Markus Daft M.D.   On: 08/13/2022 20:38   US Guided Needle Placement  Result Date: 08/13/2022 CLINICAL DATA:  Ultrasound was provided for use by the ordering physician.  No provider Interpretation or professional fees incurred.    CT ABDOMEN PELVIS W CONTRAST  Result Date: 08/12/2022 CLINICAL DATA:  Status post Caesarean section on 08/04/2022. Incisional pain and cellulitis with reassessment of subcutaneous fluid. EXAM: CT ABDOMEN AND PELVIS WITH CONTRAST TECHNIQUE: Multidetector CT imaging of the abdomen and pelvis was performed using the standard protocol following bolus administration of intravenous contrast. RADIATION DOSE REDUCTION: This exam was performed according to the departmental dose-optimization program which includes automated exposure control, adjustment of the mA and/or kV according to patient size and/or use of iterative reconstruction technique. CONTRAST:  58mL OMNIPAQUE IOHEXOL 350 MG/ML SOLN COMPARISON:  08/10/2022 FINDINGS: Lower chest: No acute abnormality. Hepatobiliary: No focal liver abnormality is seen. No gallstones, gallbladder wall thickening, or biliary dilatation. Pancreas: Unremarkable. No pancreatic ductal dilatation or surrounding inflammatory changes. Spleen: Normal in size without focal abnormality. Adrenals/Urinary Tract: Adrenal glands are unremarkable.  Kidneys are normal, without renal calculi, focal lesion, or hydronephrosis. Bladder is unremarkable. Stomach/Bowel: Bowel shows no evidence of obstruction, ileus, inflammation or lesion. The appendix is normal. No free intraperitoneal air. Vascular/Lymphatic: No significant vascular findings are present. No enlarged abdominal or pelvic lymph nodes. Reproductive: Stable appearance of recent gravid uterus. Other: Stable appearance of U-shaped subcutaneous fluid in the lower abdominal wall fat measuring up to approximately 2.3 cm in thickness to the right of midline and 2.6 cm to the left of  midline. Fluid does not show significant increase in size or peripheral enhancement. Degree of surrounding inflammation in the subcutaneous fat and overlying skin thickening may be slightly more pronounced by CT compared to the prior scan which may be consistent with some interval worsening of cellulitis. No new fluid collections are identified. Musculoskeletal: No acute or significant osseous findings. IMPRESSION: Stable appearance of U-shaped subcutaneous fluid in the lower abdominal wall fat measuring up to approximately 2.3 cm in thickness to the right of midline and 2.6 cm to the left of midline. Fluid does not show significant increase in size or peripheral enhancement. Degree of surrounding inflammation in the subcutaneous fat and overlying skin thickening may be slightly more pronounced by CT compared to the prior scan which may be consistent with some interval worsening of cellulitis. No new fluid collections are identified. Electronically Signed   By: Aletta Edouard M.D.   On: 08/12/2022 17:07   CT ABDOMEN PELVIS W CONTRAST  Addendum Date: 08/11/2022   ADDENDUM REPORT: 08/11/2022 09:51 ADDENDUM: There are two fluid pockets on each side of the midline which are connected to each other inferiorly, the right pocket measures a proximally 2.5 x 3.1 x 2.7 cm (AP x TR x CC) and the left pocket measures 2.0 x 3.0 x 2.6 cm (AP x TR x CC). In the midline inferiorly where pockets are connected to each other, it measures 1.6 x 9.5 cm (AP x TR). Electronically Signed   By: Keane Police D.O.   On: 08/11/2022 09:51   Result Date: 08/11/2022 CLINICAL DATA:  Status post cesarean section, incisional infection. EXAM: CT ABDOMEN AND PELVIS WITH CONTRAST TECHNIQUE: Multidetector CT imaging of the abdomen and pelvis was performed using the standard protocol following bolus administration of intravenous contrast. RADIATION DOSE REDUCTION: This exam was performed according to the departmental dose-optimization program  which includes automated exposure control, adjustment of the mA and/or kV according to patient size and/or use of iterative reconstruction technique. CONTRAST:  68mL OMNIPAQUE IOHEXOL 350 MG/ML SOLN COMPARISON:  CT examination dated February 07, 2020 FINDINGS: Lower chest: No acute abnormality. Hepatobiliary: No focal liver abnormality is seen. No gallstones, gallbladder wall thickening, or biliary dilatation. Pancreas: Unremarkable. No pancreatic ductal dilatation or surrounding inflammatory changes. Spleen: Normal in size without focal abnormality. Adrenals/Urinary Tract: Adrenal glands are unremarkable. Kidneys are normal, without renal calculi, focal lesion, or hydronephrosis. Bladder is unremarkable. Stomach/Bowel: Stomach is within normal limits. Appendix not clearly identified. No evidence of bowel wall thickening, distention, or inflammatory changes. Vascular/Lymphatic: No significant vascular findings are present. No enlarged abdominal or pelvic lymph nodes. Reproductive: Large heterogeneously enhancing post gravid uterus with fluid in the endometrial canal. Other: There is skin thickening and marked subcutaneous soft tissue edema. Subcutaneous fluid collection about the site of prior C-section incision which may be postsurgical hematoma/seroma. Scattered area of subcutaneous gas, likely postsurgical. Musculoskeletal: No acute or significant osseous findings. IMPRESSION: 1. Skin thickening and marked subcutaneous soft tissue edema. Subcutaneous fluid collection about the  site of prior C-section incision which may be postsurgical hematoma/seroma. Scattered area of subcutaneous gas, likely postsurgical. Infectious/inflammatory process can not be completely excluded, clinical correlation is suggested 2. Large heterogeneously enhancing post gravid uterus with fluid in the endometrial canal. Electronically Signed: By: Keane Police D.O. On: 08/10/2022 23:47    Scheduled Meds:  dolutegravir  50 mg Oral Daily    emtricitabine-tenofovir AF  1 tablet Oral Daily   polyethylene glycol  17 g Oral BID   prenatal multivitamin  1 tablet Oral Q1200   Continuous Infusions:  cefTRIAXone (ROCEPHIN)  IV 2 g (08/13/22 2142)   metronidazole 500 mg (08/14/22 0345)   vancomycin 750 mg (08/14/22 0823)   PRN Meds:acetaminophen, bisacodyl, coconut oil, cyclobenzaprine, diphenhydrAMINE, HYDROmorphone (DILAUDID) injection, hydrOXYzine, ondansetron, oxyCODONE **OR** oxyCODONE    LOS: 4 days   Donnamae Jude, MD 08/14/2022 11:05 AM

## 2022-08-15 ENCOUNTER — Inpatient Hospital Stay (HOSPITAL_COMMUNITY): Payer: Medicaid Other

## 2022-08-15 LAB — CBC WITH DIFFERENTIAL/PLATELET
Abs Immature Granulocytes: 0.55 10*3/uL — ABNORMAL HIGH (ref 0.00–0.07)
Basophils Absolute: 0.1 10*3/uL (ref 0.0–0.1)
Basophils Relative: 0 %
Eosinophils Absolute: 0.3 10*3/uL (ref 0.0–0.5)
Eosinophils Relative: 2 %
HCT: 25.6 % — ABNORMAL LOW (ref 36.0–46.0)
Hemoglobin: 9 g/dL — ABNORMAL LOW (ref 12.0–15.0)
Immature Granulocytes: 4 %
Lymphocytes Relative: 18 %
Lymphs Abs: 2.4 10*3/uL (ref 0.7–4.0)
MCH: 32.3 pg (ref 26.0–34.0)
MCHC: 35.2 g/dL (ref 30.0–36.0)
MCV: 91.8 fL (ref 80.0–100.0)
Monocytes Absolute: 1.7 10*3/uL — ABNORMAL HIGH (ref 0.1–1.0)
Monocytes Relative: 12 %
Neutro Abs: 8.6 10*3/uL — ABNORMAL HIGH (ref 1.7–7.7)
Neutrophils Relative %: 64 %
Platelets: 512 10*3/uL — ABNORMAL HIGH (ref 150–400)
RBC: 2.79 MIL/uL — ABNORMAL LOW (ref 3.87–5.11)
RDW: 13.3 % (ref 11.5–15.5)
WBC: 13.6 10*3/uL — ABNORMAL HIGH (ref 4.0–10.5)
nRBC: 0.4 % — ABNORMAL HIGH (ref 0.0–0.2)

## 2022-08-15 LAB — BASIC METABOLIC PANEL
Anion gap: 12 (ref 5–15)
BUN: 5 mg/dL — ABNORMAL LOW (ref 6–20)
CO2: 21 mmol/L — ABNORMAL LOW (ref 22–32)
Calcium: 8.7 mg/dL — ABNORMAL LOW (ref 8.9–10.3)
Chloride: 103 mmol/L (ref 98–111)
Creatinine, Ser: 0.84 mg/dL (ref 0.44–1.00)
GFR, Estimated: >60 mL/min (ref >60)
Glucose, Bld: 136 mg/dL — ABNORMAL HIGH (ref 70–99)
Potassium: 3.2 mmol/L — ABNORMAL LOW (ref 3.5–5.1)
Sodium: 136 mmol/L (ref 135–145)

## 2022-08-15 LAB — CULTURE, BLOOD (ROUTINE X 2)
Culture: NO GROWTH
Culture: NO GROWTH
Special Requests: ADEQUATE
Special Requests: ADEQUATE

## 2022-08-15 LAB — VANCOMYCIN, TROUGH: Vancomycin Tr: 10 ug/mL — ABNORMAL LOW (ref 15–20)

## 2022-08-15 IMAGING — US US OB < 14 WEEKS - US OB TV
1 series · 15 of 28 positions shown · non-contrast
Comparison: None.

CLINICAL DATA: Uncertain dating.  Irregular cycles.

EXAM:
OBSTETRIC <14 WK US AND TRANSVAGINAL OB US
TECHNIQUE: Both transabdominal and transvaginal ultrasound examinations were
performed for complete evaluation of the gestation as well as the
maternal uterus, adnexal regions, and pelvic cul-de-sac.
Transvaginal technique was performed to assess early pregnancy.

[Series 1: us ob < 14 weeks - us ob tv · 37 acquisitions, 15 frames shown]
[im 1/37]
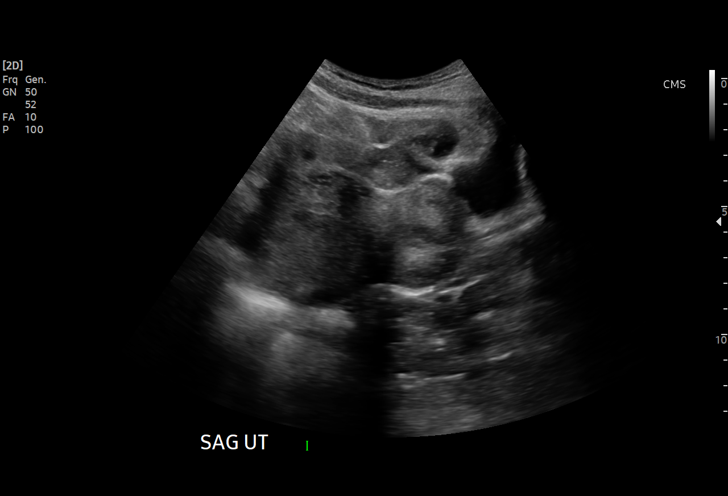
[im 3/37]
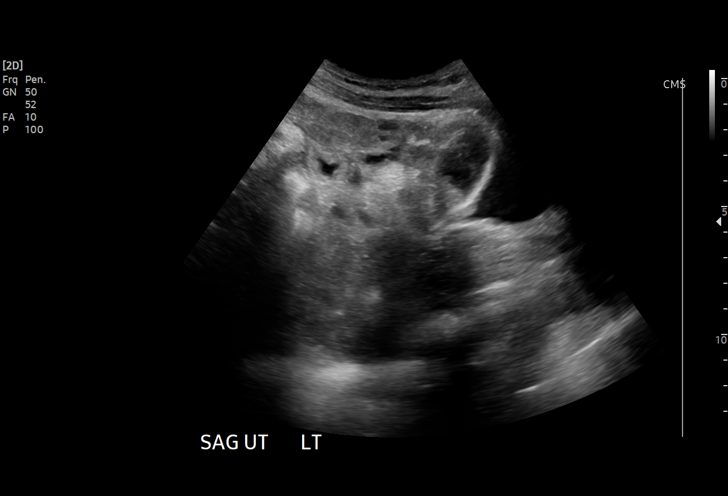
[im 6/37]
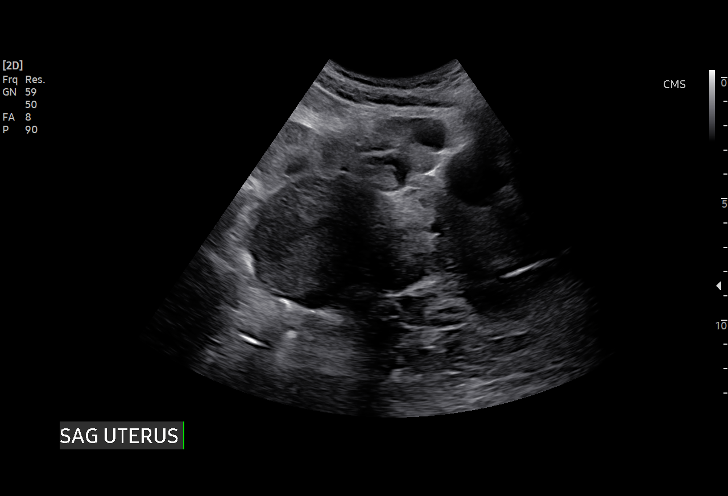
[im 9/37]
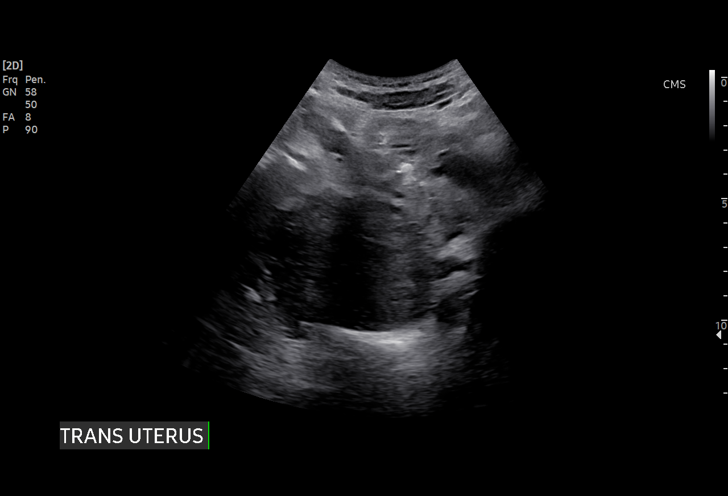
[im 11/37]
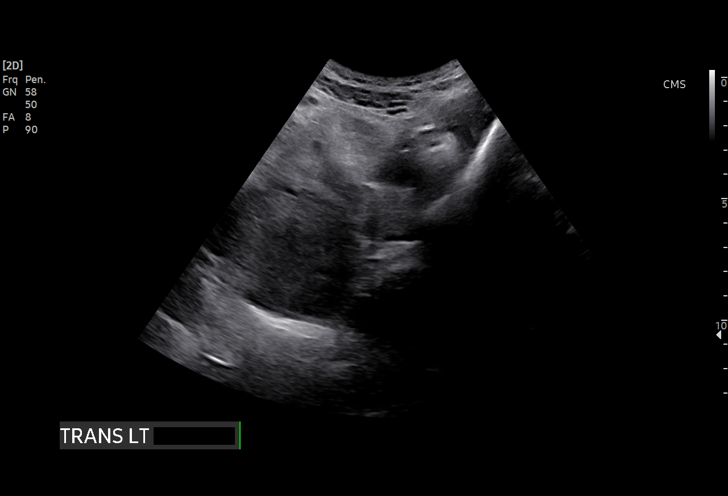
[im 14/37]
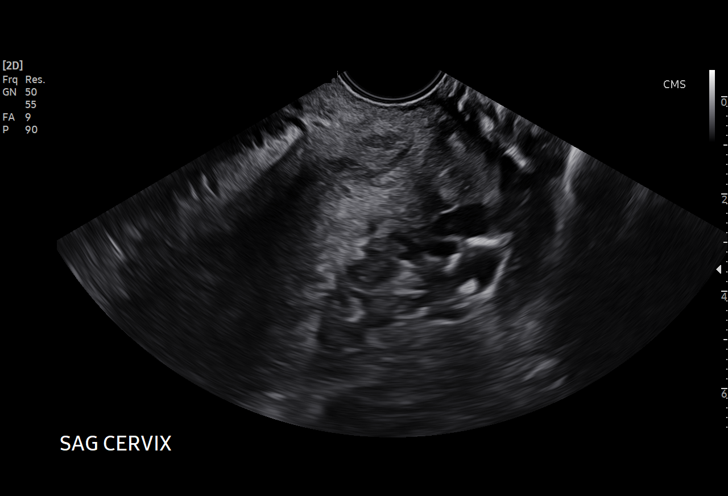
[im 17/37]
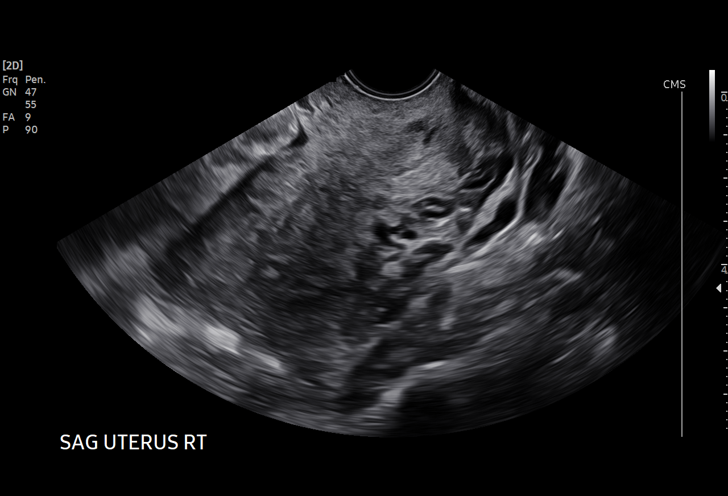
[im 19/37]
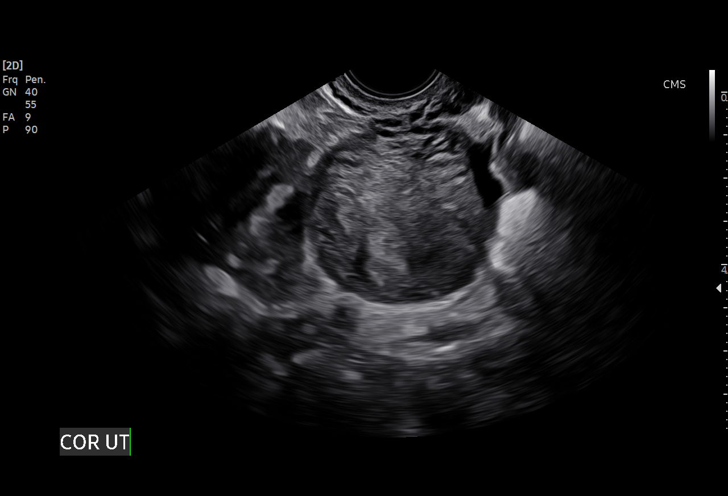
[im 21/37]
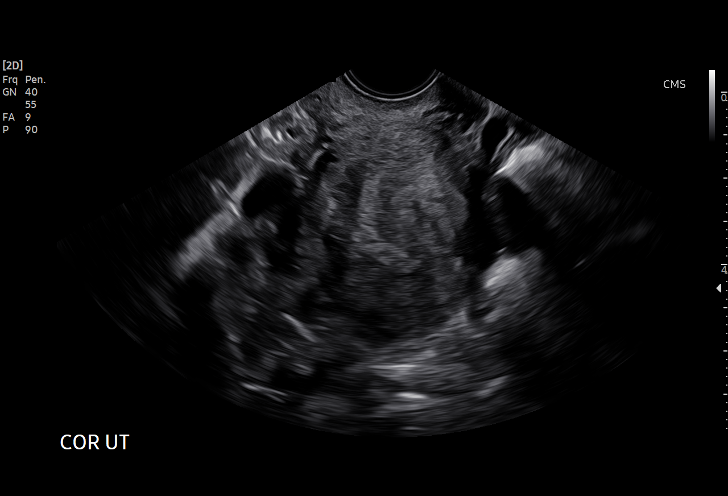
[im 23/37]
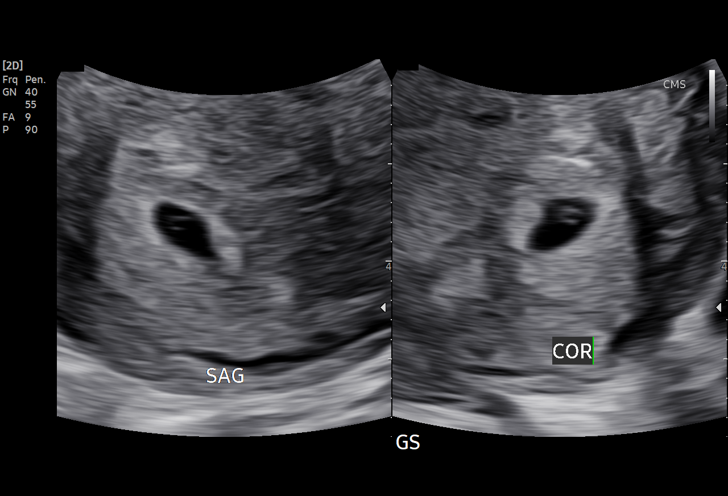
[im 26/37]
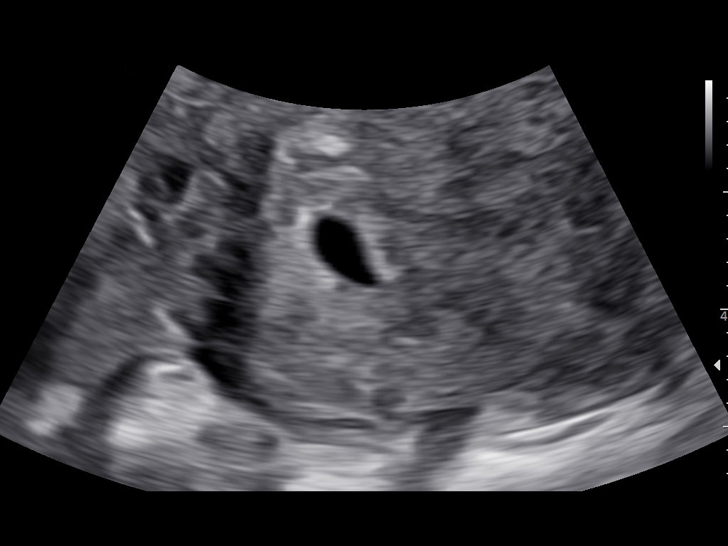
[im 29/37]
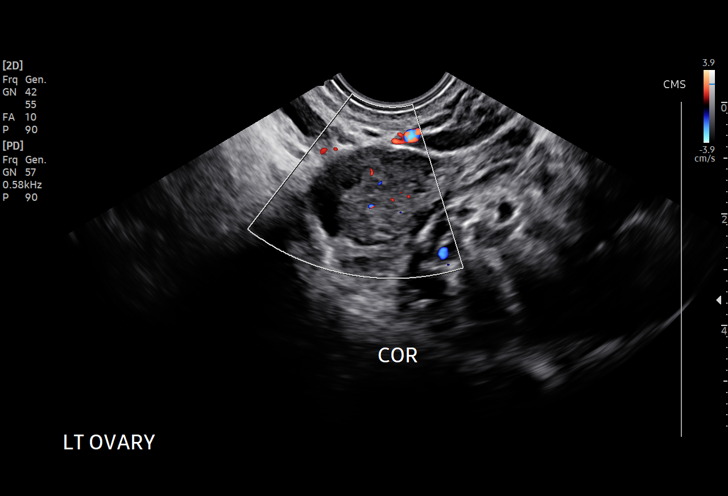
[im 31/37]
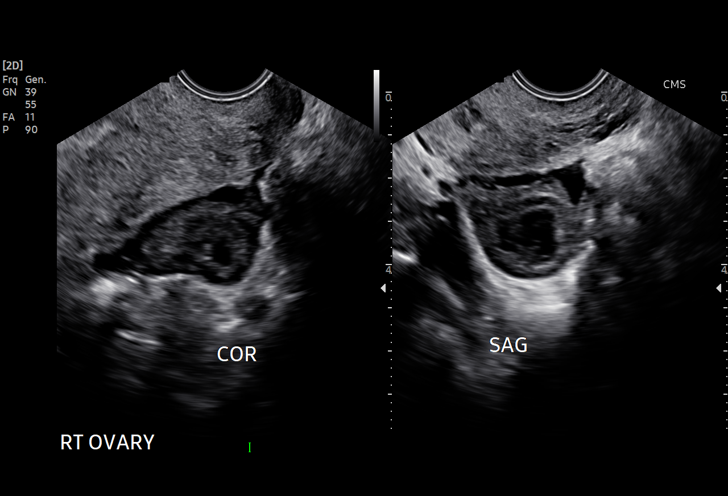
[im 34/37]
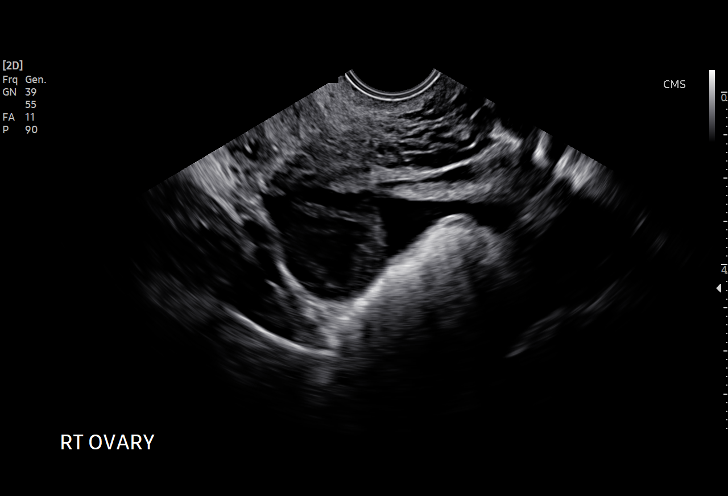
[im 37/37]
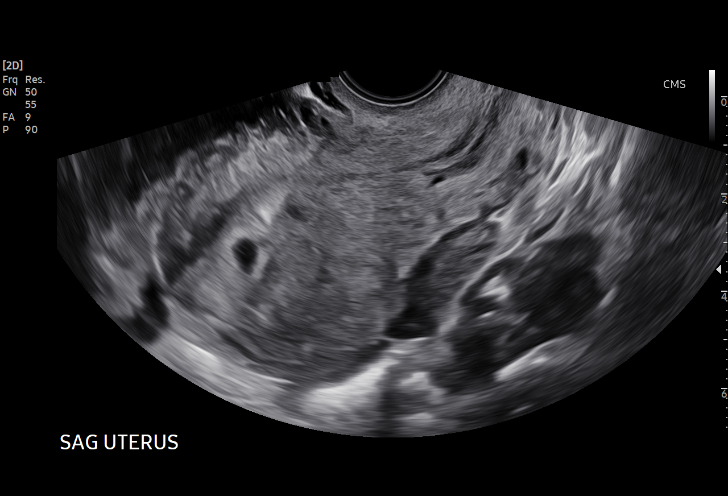

[15 of 28 positions shown; findings below may reference images not displayed]

FINDINGS: Intrauterine gestational sac: Single

Yolk sac:  Visualized.

Embryo:  Not Visualized.

Cardiac Activity: Not Visualized.

MSD: 5.95 mm   5 w   2 d

Subchorionic hemorrhage:  None visualized.

Maternal uterus/adnexae:

Right ovary: Normal containing corpus luteum.

Left ovary: Normal

Other :None

Free fluid: Small volume free fluid noted within the right adnexal
region.
IMPRESSION: 1. Probable early intrauterine gestational sac containing yolk sac.
No fetal pole, or cardiac activity yet visualized. Recommend
follow-up quantitative B-HCG levels and follow-up US in 14 days to
confirm and assess viability. This recommendation follows SRU
consensus guidelines: Diagnostic Criteria for Nonviable Pregnancy
Early in the First Trimester. N Engl J Med 0408; [DATE].
2. Small volume of free fluid noted within the right adnexa.

## 2022-08-15 MED ORDER — ACETAMINOPHEN 500 MG PO TABS
1000.0000 mg | ORAL_TABLET | Freq: Three times a day (TID) | ORAL | Status: DC
  Administered 2022-08-15 – 2022-08-17 (×7): 1000 mg via ORAL
  Filled 2022-08-15 (×7): qty 2

## 2022-08-15 MED ORDER — HYDROMORPHONE HCL 1 MG/ML IJ SOLN
0.2000 mg | INTRAMUSCULAR | Status: DC | PRN
  Administered 2022-08-15: 0.2 mg via INTRAVENOUS
  Filled 2022-08-15: qty 0.5

## 2022-08-15 MED ORDER — IOHEXOL 350 MG/ML SOLN
75.0000 mL | Freq: Once | INTRAVENOUS | Status: AC | PRN
  Administered 2022-08-15: 75 mL via INTRAVENOUS

## 2022-08-15 MED ORDER — SODIUM CHLORIDE 0.9% FLUSH
5.0000 mL | Freq: Three times a day (TID) | INTRAVENOUS | Status: DC
  Administered 2022-08-15 – 2022-08-17 (×6): 5 mL

## 2022-08-15 NOTE — Consult Note (Addendum)
Kristine Brown is a 32 y.o. female admitted on 08/10/2022  6:07 PM with sepsis and wound infection  s/p classical Csection on 08/04/22.[name] been consulted for Vancomycin dosing.  Plan: This patient's current antibiotics will be continued without adjustments. Vanc peak at 1628 = 23; trough at 2338 = 10; AUC = 411.2 (Goal 400-550)  Ht Readings from Last 1 Encounters:  06/01/22 5\' 2"  (1.575 m)       Ideal body weight: 50.1 kg (110 lb 7.2 oz) Adjusted ideal body weight: 58.4 kg (128 lb 11.4 oz)   Temp: 98.1 F (36.7 C) (03/15 2128) Temp Source: Oral (03/15 2128) BP: 124/77 (03/15 2128) Pulse Rate: 92 (03/15 2128)   Recent Labs    08/14/22 0525  WBC 16.4*  CREATININE 0.78   Estimated Creatinine Clearance: 91.4 mL/min (by C-G formula based on SCr of 0.78 mg/dL).  Allergies: Patient has no known allergies.   Antimicrobials this admission:  Cefazolin 1 gram IV 3/11 Metronidazole 500mg  IV q12h 3/11 >> Ceftriaxone 2 gram IV q24h 3/11 >> Vanc 750mg  IV q8h 3/11 >>   Microbiology results: 3/11 Bcx x2 NGTD 3/10 UCx NGTD 3/14 wound Cx: Staph aureus  Thank you for allowing pharmacy to be a part of this patient's care.  Burns Spain 08/15/2022 1:17 AM

## 2022-08-15 NOTE — Progress Notes (Signed)
[facility] NOTE  Kristine Brown is a 31 y.o. [id number] who is POD11 s/p emergent classical cesarean section for placental abruption, now readmitted for superficial surgical site infection s/p IR placement of RLQ subcutaneous drain  Length of Stay:  5 Days. Admitted 08/10/2022  Subjective: Feeling overall unchanged. Feels like swelling & induration around her incision is getting worse and is exquisitely tender to palpation. Drainage from incision is stable. Is tolerating PO and ambulating. No n/v/f/c.   Vitals:  Blood pressure 110/67, pulse 73, temperature 98.4 F (36.9 C), temperature source Oral, resp. rate 16, SpO2 99 %, unknown if currently breastfeeding. Physical Examination: CONSTITUTIONAL: Well-developed, well-nourished female in no acute distress.  NEUROLOGIC: Alert and oriented to person, place, and time.  PSYCHIATRIC: Normal mood and affect. Normal behavior. Normal judgment and thought content. CARDIOVASCULAR: Normal heart rate noted RESPIRATORY: Effort normal, no problems with respiration noted ABDOMEN: Soft, nondistended. RLQ subcutaneous drain in place with minimal serosanguinous drainage, covered with gauze that is clean/dry/intact. Induration & tenderness most pronounced superior to her incision to her umbilicus. Tender, but no warmth erythema or crepitus. No drainage noted.  Results for orders placed or performed during the hospital encounter of 08/10/22 (from the past 48 hour(s))  Aerobic/Anaerobic Culture w Gram Stain (surgical/deep wound)     Status: None (Preliminary result)   Collection Time: 08/13/22  1:44 PM   Specimen: Abscess  Result Value Ref Range   Specimen Description ABSCESS    Special Requests NONE    Gram Stain      ABUNDANT WBC PRESENT, PREDOMINANTLY PMN FEW GRAM POSITIVE COCCI IN CLUSTERS    Culture      ABUNDANT STAPHYLOCOCCUS AUREUS SUSCEPTIBILITIES TO FOLLOW Performed at [facility], [address]  [place], [place] [address]    Report Status PENDING   CBC with Differential/Platelet     Status: Abnormal   Collection Time: 08/14/22  5:25 AM  Result Value Ref Range   WBC 16.4 (H) 4.0 - 10.5 K/uL   RBC 2.67 (L) 3.87 - 5.11 MIL/uL   Hemoglobin 8.6 (L) 12.0 - 15.0 g/dL   HCT 24.7 (L) 36.0 - 46.0 %   MCV 92.5 80.0 - 100.0 fL   MCH 32.2 26.0 - 34.0 pg   MCHC 34.8 30.0 - 36.0 g/dL   RDW 13.4 11.5 - 15.5 %   Platelets 480 (H) 150 - 400 K/uL   nRBC 0.4 (H) 0.0 - 0.2 %   Neutrophils Relative % 82 %   Neutro Abs 13.4 (H) 1.7 - 7.7 K/uL   Lymphocytes Relative 9 %   Lymphs Abs 1.5 0.7 - 4.0 K/uL   Monocytes Relative 5 %   Monocytes Absolute 0.8 0.1 - 1.0 K/uL   Eosinophils Relative 4 %   Eosinophils Absolute 0.7 (H) 0.0 - 0.5 K/uL   Basophils Relative 0 %   Basophils Absolute 0.0 0.0 - 0.1 K/uL   nRBC 0 0 /100 WBC   Abs Immature Granulocytes 0.00 0.00 - 0.07 K/uL    Comment: Performed at [facility], [place] [address] [place], [place] [id number]  Basic metabolic panel     Status: Abnormal   Collection Time: 08/14/22  5:25 AM  Result Value Ref Range   Sodium 135 135 - 145 mmol/L   Potassium 3.8 3.5 - 5.1 mmol/L   Chloride 106 98 - 111 mmol/L   CO2 20 (L) 22 - 32 mmol/L   Glucose, Bld 146 (H) 70 - 99 mg/dL  Comment: Glucose reference range applies only to samples taken after fasting for at least 8 hours.   BUN 8 6 - 20 mg/dL   Creatinine, Ser 0.78 0.44 - 1.00 mg/dL   Calcium 8.8 (L) 8.9 - 10.3 mg/dL   GFR, Estimated >60 >60 mL/min    Comment: (NOTE) Calculated using the CKD-EPI Creatinine Equation (2021)    Anion gap 9 5 - 15    Comment: Performed at [facility] [address] [place], [place] [address]  Vancomycin, peak     Status: Abnormal   Collection Time: 08/14/22  6:49 PM  Result Value Ref Range   Vancomycin Pk 23 (L) 30 - 40 ug/mL    Comment: Performed at [facility], [place] [address] [place], [place] [address]  Vancomycin, trough     Status: Abnormal    Collection Time: 08/14/22 11:38 PM  Result Value Ref Range   Vancomycin Tr 10 (L) 15 - 20 ug/mL    Comment: Performed at [facility], [place] [address] [place], [place] [address]  CBC with Differential/Platelet     Status: Abnormal   Collection Time: 08/15/22  4:58 AM  Result Value Ref Range   WBC 13.6 (H) 4.0 - 10.5 K/uL   RBC 2.79 (L) 3.87 - 5.11 MIL/uL   Hemoglobin 9.0 (L) 12.0 - 15.0 g/dL   HCT 25.6 (L) 36.0 - 46.0 %   MCV 91.8 80.0 - 100.0 fL   MCH 32.3 26.0 - 34.0 pg   MCHC 35.2 30.0 - 36.0 g/dL   RDW 13.3 11.5 - 15.5 %   Platelets 512 (H) 150 - 400 K/uL   nRBC 0.4 (H) 0.0 - 0.2 %   Neutrophils Relative % 64 %   Neutro Abs 8.6 (H) 1.7 - 7.7 K/uL   Lymphocytes Relative 18 %   Lymphs Abs 2.4 0.7 - 4.0 K/uL   Monocytes Relative 12 %   Monocytes Absolute 1.7 (H) 0.1 - 1.0 K/uL   Eosinophils Relative 2 %   Eosinophils Absolute 0.3 0.0 - 0.5 K/uL   Basophils Relative 0 %   Basophils Absolute 0.1 0.0 - 0.1 K/uL   Immature Granulocytes 4 %   Abs Immature Granulocytes 0.55 (H) 0.00 - 0.07 K/uL    Comment: Performed at [facility], [place] [address] [place], [place] [id number]  Basic metabolic panel     Status: Abnormal   Collection Time: 08/15/22  4:58 AM  Result Value Ref Range   Sodium 136 135 - 145 mmol/L   Potassium 3.2 (L) 3.5 - 5.1 mmol/L   Chloride 103 98 - 111 mmol/L   CO2 21 (L) 22 - 32 mmol/L   Glucose, Bld 136 (H) 70 - 99 mg/dL    Comment: Glucose reference range applies only to samples taken after fasting for at least 8 hours.   BUN 5 (L) 6 - 20 mg/dL   Creatinine, Ser 0.84 0.44 - 1.00 mg/dL   Calcium 8.7 (L) 8.9 - 10.3 mg/dL   GFR, Estimated >60 >60 mL/min    Comment: (NOTE) Calculated using the CKD-EPI Creatinine Equation (2021)    Anion gap 12 5 - 15    Comment: Performed at [facility] [address] [place],  [address]    [place] Abscess Drain  Result Date: 08/13/2022 INDICATION: 32 year old with postoperative fluid collection at  the C-section incision. Patient has brown drainage at the incision. Recent CT imaging demonstrates subcutaneous fluid deep to  the incision. EXAM: Ultrasound-guided placement of drainage catheter in subcutaneous fluid collection MEDICATIONS: Moderate sedation ANESTHESIA/SEDATION: Moderate (conscious) sedation was employed during this procedure. A total of Versed 2mg  and fentanyl 100 mcg was administered intravenously at the order of the provider performing the procedure. Total intra-service moderate sedation time: 20 minutes. Patient's level of consciousness and vital signs were monitored continuously by radiology nurse throughout the procedure under the supervision of the provider performing the procedure. COMPLICATIONS: None immediate. PROCEDURE: Informed written consent was obtained from the patient after a thorough discussion of the procedural risks, benefits and alternatives. All questions were addressed. Maximal Sterile Barrier Technique was utilized including caps, mask, sterile gowns, sterile gloves, sterile drape, hand hygiene and skin antiseptic. A timeout was performed prior to the initiation of the procedure. Anterior pelvic region was evaluated with ultrasound. Heterogeneous fluid collection identified in the deep subcutaneous tissues in the right and left lower quadrants. Right lower quadrant fluid component appeared to be larger. The right side of the lower abdomen and pelvis was prepped with Betadine and sterile field was created. Skin was anesthetized with 1% lidocaine. Small incision was made. Using ultrasound guidance, a Yueh catheter was directed into the fluid collection and purulent looking brown fluid was aspirated. Superstiff Amplatz wire was placed and a 10 French drain was placed over the wire. 40 mL of brown purulent fluid was removed. Fluid collection appear to be significantly smaller following drain placement even on the left side. Drain was sutured to skin and attached to suction bulb.  Fluid was sent for culture. Bandage was placed. FINDINGS: Heterogeneous fluid in the deep subcutaneous tissues in the anterior lower abdomen and pelvis near the surgical incision. Fluid collection was slightly larger on the right side than the left. Fluid collections decreased in size following placement of the right lower quadrant subcutaneous drain. IMPRESSION: Ultrasound-guided placement of a drainage catheter in the right anterior pelvic subcutaneous abscess. Fluid was sent for culture. Electronically Signed   By: Markus Daft M.D.   On: 08/13/2022 20:38   US Guided Needle Placement  Result Date: 08/13/2022 CLINICAL DATA:  Ultrasound was provided for use by the ordering physician.  No provider Interpretation or professional fees incurred.     Current scheduled medications  acetaminophen  1,000 mg Oral Q8H   dolutegravir  50 mg Oral Daily   emtricitabine-tenofovir AF  1 tablet Oral Daily   enoxaparin (LOVENOX) injection  40 mg Subcutaneous Q24H   polyethylene glycol  17 g Oral BID   prenatal multivitamin  1 tablet Oral Q1200   sodium chloride flush  5 mL Intracatheter Q8H   I have reviewed the patient's current medications.  ASSESSMENT: Principal Problem:   Infected incision Active Problems:   Asymptomatic HIV infection ([place])   Depression   Delivery by classical cesarean section   Sepsis after cesarean section   PLAN: SSI - subcutaneous abscess & overlying cellulitis s/p RLQ drain placement 3/14 - Tlast 38.2 3/12 @ 2000; leukocytosis continues to improve (24.5 >> 13.6 this AM) - wound cultures +staph aureus, awaiting sensitivities  - continue IV Vanc/CTX/flagyl - will narrow pending culture data - repeat CT A/P given lack of symptomatic improvement. If persistent fluid collection, anticipate wound washout on 3/17 (NPO@MN  until CT results) - encouraged pt to transition to PO pain medications and limit IV dilaudid as we try to prep for discharge home in the next few days  2. HIV -  continue home meds  3. Postoperative anemia - s/p IV  iron 3/12  4. Hypokalemia - repletion ordered (PO 86mEq BID)  5. Postpartum - infant critical in NICU - pumping - lactation consult ordered  Ppx: SCDs, pLov Dispo: continue inpatient care  Gale Journey, [place], [facility] for [facility], [place]

## 2022-08-16 DIAGNOSIS — B9562 Methicillin resistant Staphylococcus aureus infection as the cause of diseases classified elsewhere: Secondary | ICD-10-CM | POA: Diagnosis present

## 2022-08-16 LAB — CBC WITH DIFFERENTIAL/PLATELET
Abs Immature Granulocytes: 0.48 10*3/uL — ABNORMAL HIGH (ref 0.00–0.07)
Basophils Absolute: 0.1 10*3/uL (ref 0.0–0.1)
Basophils Relative: 1 %
Eosinophils Absolute: 0.2 10*3/uL (ref 0.0–0.5)
Eosinophils Relative: 2 %
HCT: 27.6 % — ABNORMAL LOW (ref 36.0–46.0)
Hemoglobin: 9.2 g/dL — ABNORMAL LOW (ref 12.0–15.0)
Immature Granulocytes: 4 %
Lymphocytes Relative: 18 %
Lymphs Abs: 2.1 10*3/uL (ref 0.7–4.0)
MCH: 31.2 pg (ref 26.0–34.0)
MCHC: 33.3 g/dL (ref 30.0–36.0)
MCV: 93.6 fL (ref 80.0–100.0)
Monocytes Absolute: 1.7 10*3/uL — ABNORMAL HIGH (ref 0.1–1.0)
Monocytes Relative: 15 %
Neutro Abs: 7.1 10*3/uL (ref 1.7–7.7)
Neutrophils Relative %: 60 %
Platelets: 524 10*3/uL — ABNORMAL HIGH (ref 150–400)
RBC: 2.95 MIL/uL — ABNORMAL LOW (ref 3.87–5.11)
RDW: 13.4 % (ref 11.5–15.5)
WBC: 11.6 10*3/uL — ABNORMAL HIGH (ref 4.0–10.5)
nRBC: 0.3 % — ABNORMAL HIGH (ref 0.0–0.2)

## 2022-08-16 MED ORDER — CLINDAMYCIN HCL 300 MG PO CAPS
600.0000 mg | ORAL_CAPSULE | Freq: Three times a day (TID) | ORAL | Status: DC
  Administered 2022-08-16 – 2022-08-17 (×4): 600 mg via ORAL
  Filled 2022-08-16 (×6): qty 2

## 2022-08-16 NOTE — Progress Notes (Signed)
Patient ID: Kristine Brown, female   DOB: 1990-06-16, 32 y.o.   MRN: [id number]   * No surgery found *  Kristine Brown is a 32 y.o. female patient.   Post Op wound infection MRSA by culture  Past Medical History:  Diagnosis Date   Anxiety    Asthma    Depression    HIV (human immunodeficiency virus infection) ([place])     No past surgical history pertinent negatives on file.  Scheduled Meds:  acetaminophen  1,000 mg Oral Q8H   clindamycin  600 mg Oral Q8H   dolutegravir  50 mg Oral Daily   emtricitabine-tenofovir AF  1 tablet Oral Daily   enoxaparin (LOVENOX) injection  40 mg Subcutaneous Q24H   polyethylene glycol  17 g Oral BID   prenatal multivitamin  1 tablet Oral Q1200   sodium chloride flush  5 mL Intracatheter Q8H    Continuous Infusions:  PRN Meds:coconut oil, cyclobenzaprine, diphenhydrAMINE, HYDROmorphone (DILAUDID) injection, hydrOXYzine, ondansetron, oxyCODONE **OR** oxyCODONE  No Known Allergies  Principal Problem:   Infected incision Active Problems:   Asymptomatic HIV infection ([place])   Depression   Delivery by classical cesarean section   Sepsis after cesarean section   Subjective   Pt states she feels much better today Eating ok  Objective   Vitals:   08/15/22 1640 08/15/22 1928 08/15/22 2329 08/16/22 0416  BP: 116/63 114/69 110/60 112/64  Pulse: 80 87 73 67  Resp: 16 17 16 17   Temp: 98 F (36.7 C) 98.3 F (36.8 C) 98.4 F (36.9 C) 98.3 F (36.8 C)  TempSrc: Oral Oral Oral Oral  SpO2: 100% 100% 98% 99%   Vitals:   08/14/22 0326 08/14/22 0748 08/14/22 1159 08/14/22 2128  BP: 121/75 126/76 119/74 124/77   08/15/22 0020 08/15/22 0450 08/15/22 0813 08/15/22 1150  BP: 111/72 (!) 114/57 110/67 120/69   08/15/22 1640 08/15/22 1928 08/15/22 2329 08/16/22 0416  BP: 116/63 114/69 110/60 112/64     Subjective Objective: Vital signs (most recent): Blood pressure 112/64, pulse 67, temperature 98.3 F (36.8 C), temperature source Oral, resp.  rate 17, SpO2 99 %, unknown if currently breastfeeding.   Gen  sleepy NAD Abdomen  declines exam for now, will later Incision  drain in place     Latest Ref Rng & Units 08/16/2022    4:02 AM 08/15/2022    4:58 AM 08/14/2022    5:25 AM  CBC  WBC 4.0 - 10.5 K/uL 11.6  13.6  16.4   Hemoglobin 12.0 - 15.0 g/dL 9.2  9.0  8.6   Hematocrit 36.0 - 46.0 % 27.6  25.6  24.7   Platelets 150 - 400 K/uL 524  512  480        Latest Ref Rng & Units 08/15/2022    4:58 AM 08/14/2022    5:25 AM 08/13/2022    5:13 AM  CMP  Glucose 70 - 99 mg/dL 136  146  90   BUN 6 - 20 mg/dL 5  8  8    Creatinine 0.44 - 1.00 mg/dL 0.84  0.78  0.64   Sodium 135 - 145 mmol/L 136  135  136   Potassium 3.5 - 5.1 mmol/L 3.2  3.8  3.3   Chloride 98 - 111 mmol/L 103  106  105   CO2 22 - 32 mmol/L 21  20  18    Calcium 8.9 - 10.3 mg/dL 8.7  8.8  8.6     CT ABDOMEN PELVIS W CONTRAST  Result Date: 08/15/2022 CLINICAL DATA:  Abdominal wall abscess status post percutaneous drainage EXAM: CT ABDOMEN AND PELVIS WITH CONTRAST TECHNIQUE: Multidetector CT imaging of the abdomen and pelvis was performed using the standard protocol following bolus administration of intravenous contrast. RADIATION DOSE REDUCTION: This exam was performed according to the departmental dose-optimization program which includes automated exposure control, adjustment of the mA and/or kV according to patient size and/or use of iterative reconstruction technique. CONTRAST:  71mL OMNIPAQUE IOHEXOL 350 MG/ML SOLN COMPARISON:  08/12/2022 FINDINGS: Lower chest: No acute abnormality. Hepatobiliary: No focal liver abnormality is seen. No gallstones, gallbladder wall thickening, or biliary dilatation. Pancreas: Unremarkable. No pancreatic ductal dilatation or surrounding inflammatory changes. Spleen: Normal in size without focal abnormality. Adrenals/Urinary Tract: Adrenal glands are unremarkable. Kidneys are normal, without renal calculi, focal lesion, or hydronephrosis.  Bladder is unremarkable. Stomach/Bowel: Stomach is within normal limits. Appendix appears normal. No evidence of bowel wall thickening, distention, or inflammatory changes. Vascular/Lymphatic: No significant vascular findings are present. No enlarged abdominal or pelvic lymph nodes. Reproductive: The uterus is enlarged in keeping with history of recent gestation. No adnexal masses are seen. Other: Since the prior examination, a pigtail percutaneous drainage catheter has been placed into the right parasagittal aspect of the u shaped collection within the infraumbilical anterior abdominal wall along the expected surgical incision. The fluid component has significantly decreased in the interval, now measuring up to 17 mm in thickness to the right of midline and 10 mm in thickness to the left of midline. Superimposed surrounding subcutaneous infiltration and dermal thickening appears improved in keeping with improving surrounding inflammatory change. No new fluid collections are identified. Musculoskeletal: No acute bone abnormality. No lytic or blastic bone lesion. IMPRESSION: 1. Interval placement of a pigtail percutaneous drainage catheter within the right parasagittal aspect of the U shaped collection within the infraumbilical anterior abdominal wall along the expected surgical incision. The fluid component has significantly decreased in the interval, now measuring up to 17 mm in thickness to the right of midline and 10 mm in thickness to the left of midline. Superimposed surrounding subcutaneous infiltration and dermal thickening appears improved in keeping with improving surrounding inflammatory change. No new fluid collections are identified. Electronically Signed   By: Fidela Salisbury M.D.   On: 08/15/2022 20:30     Assessment & Plan MRSA SSI, drain in place with subjective and objective improvement  Will stop her IV antibiotics, received vancomycin this am already and start oral clindamycin 600 mg q8h If  continues to improve anticipate discharge in 24-48 hours  Florian Buff, MD 08/16/2022, 10:31 AM

## 2022-08-17 ENCOUNTER — Other Ambulatory Visit: Payer: Self-pay | Admitting: Radiology

## 2022-08-17 ENCOUNTER — Other Ambulatory Visit (HOSPITAL_COMMUNITY): Payer: Self-pay

## 2022-08-17 DIAGNOSIS — O8604 Sepsis following an obstetrical procedure: Secondary | ICD-10-CM

## 2022-08-17 LAB — CBC WITH DIFFERENTIAL/PLATELET
Abs Immature Granulocytes: 0.4 10*3/uL — ABNORMAL HIGH (ref 0.00–0.07)
Basophils Absolute: 0.1 10*3/uL (ref 0.0–0.1)
Basophils Relative: 1 %
Eosinophils Absolute: 0.2 10*3/uL (ref 0.0–0.5)
Eosinophils Relative: 2 %
HCT: 27.8 % — ABNORMAL LOW (ref 36.0–46.0)
Hemoglobin: 9.5 g/dL — ABNORMAL LOW (ref 12.0–15.0)
Immature Granulocytes: 4 %
Lymphocytes Relative: 19 %
Lymphs Abs: 1.8 10*3/uL (ref 0.7–4.0)
MCH: 32.1 pg (ref 26.0–34.0)
MCHC: 34.2 g/dL (ref 30.0–36.0)
MCV: 93.9 fL (ref 80.0–100.0)
Monocytes Absolute: 1.1 10*3/uL — ABNORMAL HIGH (ref 0.1–1.0)
Monocytes Relative: 11 %
Neutro Abs: 6.1 10*3/uL (ref 1.7–7.7)
Neutrophils Relative %: 63 %
Platelets: 523 10*3/uL — ABNORMAL HIGH (ref 150–400)
RBC: 2.96 MIL/uL — ABNORMAL LOW (ref 3.87–5.11)
RDW: 13.7 % (ref 11.5–15.5)
WBC: 9.5 10*3/uL (ref 4.0–10.5)
nRBC: 0 % (ref 0.0–0.2)

## 2022-08-17 LAB — MRSA NEXT GEN BY PCR, NASAL: MRSA by PCR Next Gen: NOT DETECTED

## 2022-08-17 MED ORDER — OXYCODONE HCL 5 MG PO TABS
5.0000 mg | ORAL_TABLET | ORAL | 0 refills | Status: DC | PRN
  Filled 2022-08-17: qty 30, 5d supply, fill #0

## 2022-08-17 MED ORDER — CLINDAMYCIN HCL 300 MG PO CAPS
600.0000 mg | ORAL_CAPSULE | Freq: Three times a day (TID) | ORAL | 0 refills | Status: AC
  Filled 2022-08-17: qty 60, 10d supply, fill #0

## 2022-08-17 MED ORDER — OXYCODONE HCL 5 MG PO TABS
5.0000 mg | ORAL_TABLET | Freq: Four times a day (QID) | ORAL | 0 refills | Status: AC | PRN
  Filled 2022-08-17: qty 20, 5d supply, fill #0

## 2022-08-17 MED ORDER — OXYCODONE HCL 5 MG PO TABS: 5.0000 mg | ORAL_TABLET | Freq: Four times a day (QID) | ORAL | 0 refills | Status: DC | PRN

## 2022-08-17 MED ORDER — OXYCODONE HCL 5 MG PO TABS: 5.0000 mg | ORAL_TABLET | ORAL | 0 refills | Status: DC | PRN

## 2022-08-17 MED ORDER — CLINDAMYCIN HCL 300 MG PO CAPS
600.0000 mg | ORAL_CAPSULE | Freq: Three times a day (TID) | ORAL | 0 refills | Status: DC
  Filled 2022-08-17: qty 60, 10d supply, fill #0

## 2022-08-17 NOTE — Discharge Summary (Signed)
Physician Discharge Summary  Patient ID: Cebrina Herington MRN: [id number] DOB/AGE: [birth year] 32 y.o.  Admit date: 08/10/2022 Discharge date: 08/17/2022  Admission Diagnoses: Postop infection  Discharge Diagnoses:  Principal Problem:   Infected incision Active Problems:   Asymptomatic HIV infection ([place])   Depression   Delivery by classical cesarean section   Sepsis after cesarean section   MRSA cellulitis   Discharged Condition: stable  Hospital Course: 32yo [id number] admitted due to postop infection.  She was treated with IV vancomycin, Rocephin and Flagyl.  Pt was seen by IR and drain placed- see procedure note.  Culture returned MRSA.  Pt noted to have improving white count and remain afebrile x 24hrs.  She was transitioned to oral Clindamycin and was discharged home with plans for outpatient follow up.  Consults:  Interventional Radiology  Significant Diagnostic Studies: labs:     Latest Ref Rng & Units 08/17/2022    7:26 AM 08/16/2022    4:02 AM 08/15/2022    4:58 AM  CBC  WBC 4.0 - 10.5 K/uL 9.5  11.6  13.6   Hemoglobin 12.0 - 15.0 g/dL 9.5  9.2  9.0   Hematocrit 36.0 - 46.0 % 27.8  27.6  25.6   Platelets 150 - 400 K/uL 523  524  512    CT scan- last completed 3/16: IMPRESSION: 1. Interval placement of a pigtail percutaneous drainage catheter within the right parasagittal aspect of the U shaped collection within the infraumbilical anterior abdominal wall along the expected surgical incision. The fluid component has significantly decreased in the interval, now measuring up to 17 mm in thickness to the right of midline and 10 mm in thickness to the left of midline. Superimposed surrounding subcutaneous infiltration and dermal thickening appears improved in keeping with improving surrounding inflammatory change. No new fluid collections are identified.    Treatments: IV hydration, antibiotics: Ancef, vancomycin, and Flagyl converted to Clindamycin, and analgesia:  tylenol, ibuprofen, oxycodone  Discharge Exam: Blood pressure 123/72, pulse 94, temperature 98.1 F (36.7 C), temperature source Oral, resp. rate 18, SpO2 100 %, unknown if currently breastfeeding. Physical Exam:  General: alert, cooperative and no distress Chest: no respiratory distress, CTAB Heart: regular rate and rhythm Abdomen: soft, some induration noted on left- non-tender, no rebound, no guarding Uterine Fundus: firm, below umbilicus, non- tender Incision: well healed- C/D/I Bandage on right in place with drain- minimal serous fluid DVT Evaluation: No calf swelling or tenderness Extremities: no edema, no calf tenderness bilaterally Skin: warm, dry  Disposition: Discharge disposition: 01-Home or Self Care        Allergies as of 08/17/2022   No Known Allergies      Medication List     TAKE these medications    acetaminophen 500 MG tablet Commonly known as: TYLENOL Take 2 tablets (1,000 mg total) by mouth every 6 (six) hours as needed.   albuterol 108 (90 Base) MCG/ACT inhaler Commonly known as: VENTOLIN HFA Inhale into the lungs.   clindamycin 300 MG capsule Commonly known as: CLEOCIN Take 2 capsules (600 mg total) by mouth every 8 (eight) hours for 10 days.   Descovy 200-25 MG tablet Generic drug: emtricitabine-tenofovir AF Take 1 tablet by mouth daily.   FeroSul 325 (65 FE) MG tablet Generic drug: ferrous sulfate Take 1 tablet (325 mg total) by mouth every other day.   ibuprofen 600 MG tablet Commonly known as: ADVIL Take 1 tablet (600 mg total) by mouth every 6 (six) hours.   oxyCODONE 5 MG  immediate release tablet Commonly known as: Oxy IR/ROXICODONE Take 1 tablet (5 mg total) by mouth every 6 (six) hours as needed for up to 5 days for moderate pain. What changed: when to take this   Tivicay 50 MG tablet Generic drug: dolutegravir Take 1 tablet (50 mg total) by mouth daily.        Follow-up [place] for [facility]  at [facility] Follow up.   Specialty: Obstetrics and Gynecology Why: Please call and follow up in one week Contact information: [place] [phone]        Markus Daft, MD Follow up.   Specialties: Interventional Radiology, Radiology Why: IR scheduler will call you to setup your drain follow up appointment (typically about 10 days after discharge). Please call [phone] M-F 8a-5p or [phone] at any time with questions or concerns. Contact information: [place] [address] [phone]                 Signed: Annalee Genta 08/17/2022, 5:44 PM

## 2022-08-17 NOTE — Progress Notes (Signed)
Progress Note  Subjective:  Resting comfortably up in NICU with baby.  Reports some "hardness" on the left side of her abdomen, but slowly improving.  Pain controlled.  Denies fever/chills.  No nausea/vomiting.  Tolerating regular diet.  +flatus, +BM.  No acute complaints.  Objective: Blood pressure 113/74, pulse 82, temperature 98.2 F (36.8 C), temperature source Oral, resp. rate 17, SpO2 100 %, unknown if currently breastfeeding.  Drain output: 35cc/8hr  Physical Exam:  General: alert, cooperative and no distress Chest: no respiratory distress, CTAB Heart: regular rate and rhythm Abdomen: soft, some induration noted on left- non-tender, no rebound, no guarding Uterine Fundus: firm, below umbilicus, non- tender Incision: well healed- C/D/I Bandage on right in place with drain- minimal serous fluid DVT Evaluation: No calf swelling or tenderness Extremities: no edema, no calf tenderness bilaterally Skin: warm, dry  Results for orders placed or performed during the hospital encounter of 08/10/22 (from the past 24 hour(s))  CBC with Differential/Platelet     Status: Abnormal   Collection Time: 08/17/22  7:26 AM  Result Value Ref Range   WBC 9.5 4.0 - 10.5 K/uL   RBC 2.96 (L) 3.87 - 5.11 MIL/uL   Hemoglobin 9.5 (L) 12.0 - 15.0 g/dL   HCT 27.8 (L) 36.0 - 46.0 %   MCV 93.9 80.0 - 100.0 fL   MCH 32.1 26.0 - 34.0 pg   MCHC 34.2 30.0 - 36.0 g/dL   RDW 13.7 11.5 - 15.5 %   Platelets 523 (H) 150 - 400 K/uL   nRBC 0.0 0.0 - 0.2 %   Neutrophils Relative % 63 %   Neutro Abs 6.1 1.7 - 7.7 K/uL   Lymphocytes Relative 19 %   Lymphs Abs 1.8 0.7 - 4.0 K/uL   Monocytes Relative 11 %   Monocytes Absolute 1.1 (H) 0.1 - 1.0 K/uL   Eosinophils Relative 2 %   Eosinophils Absolute 0.2 0.0 - 0.5 K/uL   Basophils Relative 1 %   Basophils Absolute 0.1 0.0 - 0.1 K/uL   Immature Granulocytes 4 %   Abs Immature Granulocytes 0.40 (H) 0.00 - 0.07 K/uL    Assessment/Plan: Kristine Brown is a 32  y.o. [id number] admitted for postop wound infection- MRSA  -currently on oral Clindamycin -drain in place, with outpt as above -leukocytosis improved, pt afebrile  for over 24hrs -meeting milestones appropriately -plan for discharge home today with outpatient IR follow up  Janyth Pupa, DO Attending [place], [place] for [place]

## 2022-08-17 NOTE — Lactation Note (Signed)
This note was copied from a baby's chart.  NICU Lactation Consultation Note  Patient Name: Boy Tanveer Bergstrand [id number] Date: 08/17/2022 Age:32 years  Reason for consult: Follow-up assessment; Primapara; NICU baby; Preterm <34wks; 1st time breastfeeding; Infant < 6lbs   Subjective  LC in to visit with P46 Mom of preterm baby in the NICU.  Mom has been consistently pumping and expressing 120 ml approx per pumping.  Mom is still being treated for wound infection and is staying on OBSC.  No questions or concerns at this time.  Mom takes her EBM back home to her freezer.  Baby is NPO.  Objective Infant data: Infant feeding assessment   Maternal data: [id number]  C-Section, Classical Pumping frequency: Every 3 hrs Pumped volume: 120 mL Flange Size: 24  WIC Program: No WIC Referral Sent?: Yes Pump:  (No pump at home, [facility] referral sent on admission)  Assessment Infant: Feeding Status: NPO  Maternal: Milk volume: Normal  Intervention/Plan Tools: Pump; Flanges; Hands-free pumping top Pump Education: Setup, frequency, and cleaning; Milk Storage  Plan: Consult Status: NICU follow-up  NICU Follow-up type: Weekly NICU follow up    Broadus John 08/17/2022, 11:08 AM

## 2022-08-17 NOTE — Progress Notes (Signed)
Referring Physician(s): Anyanwu,Ugonna A  Supervising Physician: Ruthann Cancer  Patient Status:  [facility] - In-pt  Chief Complaint: Follow up intra-abdominal abscess drain placed 08/13/22  Subjective:  Patient seen in her room, she is looking forward to going home today. She has help at home but is very independent and is confident she can care for the drain herself.   Allergies: Patient has no known allergies.  Medications: Prior to Admission medications   Medication Sig Start Date End Date Taking? Authorizing Provider  acetaminophen (TYLENOL) 500 MG tablet Take 2 tablets (1,000 mg total) by mouth every 6 (six) hours as needed. 04/07/22 04/07/23 Yes Rasch, Artist Pais, NP  dolutegravir (TIVICAY) 50 MG tablet Take 1 tablet (50 mg total) by mouth daily. 04/06/22  Yes Golden Circle, FNP  emtricitabine-tenofovir AF (DESCOVY) 200-25 MG tablet Take 1 tablet by mouth daily. 04/06/22  Yes Golden Circle, FNP  ferrous sulfate 325 (65 FE) MG tablet Take 1 tablet (325 mg total) by mouth every other day. 08/07/22  Yes Griffin Basil, MD  ibuprofen (ADVIL) 600 MG tablet Take 1 tablet (600 mg total) by mouth every 6 (six) hours. 08/07/22  Yes Griffin Basil, MD  oxyCODONE (OXY IR/ROXICODONE) 5 MG immediate release tablet Take 1 tablet (5 mg total) by mouth every 4 (four) hours as needed for moderate pain. 08/07/22  Yes Griffin Basil, MD  albuterol (VENTOLIN HFA) 108 (90 Base) MCG/ACT inhaler Inhale into the lungs. 08/20/20   [provider]  clindamycin (CLEOCIN) 300 MG capsule Take 2 capsules (600 mg total) by mouth every 8 (eight) hours for 10 days. 08/17/22 08/27/22  Janyth Pupa, DO  oxyCODONE (OXY IR/ROXICODONE) 5 MG immediate release tablet Take 1 tablet (5 mg total) by mouth every 6 (six) hours as needed for up to 5 days for moderate pain. 08/17/22 08/22/22  Janyth Pupa, DO     Vital Signs: BP 123/72 (BP Location: Left Arm)   Pulse 94   Temp 98.1 F (36.7 C) (Oral)   Resp 18    SpO2 100%   Physical Exam Vitals and nursing note reviewed.  Constitutional:      General: She is not in acute distress. Cardiovascular:     Rate and Rhythm: Normal rate.  Pulmonary:     Effort: Pulmonary effort is normal.  Abdominal:     Palpations: Abdomen is soft.     Comments: (+) RLQ drain to suction bulb with ~15 cc thick, blood tinged purulent output present. Flushed easily. Dressed appropriately. Patient demonstrated flushing, emptying bulb and recharging bulb today.  Skin:    General: Skin is warm and dry.  Neurological:     Mental Status: She is alert. Mental status is at baseline.  Psychiatric:        Mood and Affect: Mood normal.        Behavior: Behavior normal.        Thought Content: Thought content normal.        Judgment: Judgment normal.     Imaging: CT ABDOMEN PELVIS W CONTRAST  Result Date: 08/15/2022 CLINICAL DATA:  Abdominal wall abscess status post percutaneous drainage EXAM: CT ABDOMEN AND PELVIS WITH CONTRAST TECHNIQUE: Multidetector CT imaging of the abdomen and pelvis was performed using the standard protocol following bolus administration of intravenous contrast. RADIATION DOSE REDUCTION: This exam was performed according to the departmental dose-optimization program which includes automated exposure control, adjustment of the mA and/or kV according to patient size and/or use of  iterative reconstruction technique. CONTRAST:  43mL OMNIPAQUE IOHEXOL 350 MG/ML SOLN COMPARISON:  08/12/2022 FINDINGS: Lower chest: No acute abnormality. Hepatobiliary: No focal liver abnormality is seen. No gallstones, gallbladder wall thickening, or biliary dilatation. Pancreas: Unremarkable. No pancreatic ductal dilatation or surrounding inflammatory changes. Spleen: Normal in size without focal abnormality. Adrenals/Urinary Tract: Adrenal glands are unremarkable. Kidneys are normal, without renal calculi, focal lesion, or hydronephrosis. Bladder is unremarkable. Stomach/Bowel:  Stomach is within normal limits. Appendix appears normal. No evidence of bowel wall thickening, distention, or inflammatory changes. Vascular/Lymphatic: No significant vascular findings are present. No enlarged abdominal or pelvic lymph nodes. Reproductive: The uterus is enlarged in keeping with history of recent gestation. No adnexal masses are seen. Other: Since the prior examination, a pigtail percutaneous drainage catheter has been placed into the right parasagittal aspect of the u shaped collection within the infraumbilical anterior abdominal wall along the expected surgical incision. The fluid component has significantly decreased in the interval, now measuring up to 17 mm in thickness to the right of midline and 10 mm in thickness to the left of midline. Superimposed surrounding subcutaneous infiltration and dermal thickening appears improved in keeping with improving surrounding inflammatory change. No new fluid collections are identified. Musculoskeletal: No acute bone abnormality. No lytic or blastic bone lesion. IMPRESSION: 1. Interval placement of a pigtail percutaneous drainage catheter within the right parasagittal aspect of the U shaped collection within the infraumbilical anterior abdominal wall along the expected surgical incision. The fluid component has significantly decreased in the interval, now measuring up to 17 mm in thickness to the right of midline and 10 mm in thickness to the left of midline. Superimposed surrounding subcutaneous infiltration and dermal thickening appears improved in keeping with improving surrounding inflammatory change. No new fluid collections are identified. Electronically Signed   By: Fidela Salisbury M.D.   On: 08/15/2022 20:30    Labs:  CBC: Recent Labs    08/14/22 0525 08/15/22 0458 08/16/22 0402 08/17/22 0726  WBC 16.4* 13.6* 11.6* 9.5  HGB 8.6* 9.0* 9.2* 9.5*  HCT 24.7* 25.6* 27.6* 27.8*  PLT 480* 512* 524* 523*    COAGS: No results for input(s):  "INR", "APTT" in the last 8760 hours.  BMP: Recent Labs    08/12/22 0419 08/13/22 0513 08/14/22 0525 08/15/22 0458  NA 137 136 135 136  K 3.4* 3.3* 3.8 3.2*  CL 105 105 106 103  CO2 23 18* 20* 21*  GLUCOSE 107* 90 146* 136*  BUN 7 8 8  5*  CALCIUM 8.5* 8.6* 8.8* 8.7*  CREATININE 0.69 0.64 0.78 0.84  GFRNONAA >60 >60 >60 >60    LIVER FUNCTION TESTS: Recent Labs    04/07/22 0730 04/09/22 0921 06/01/22 2234 08/10/22 1922  BILITOT 0.3 0.5 0.3 0.7  AST 21 21 20 29   ALT 14 19 15  32  ALKPHOS 41 45 43 85  PROT 7.6 7.4 6.5 6.6  ALBUMIN 4.0 4.2 3.3* 2.6*    Assessment and Plan:  32 y/o F s/p emergent c-section 08/04/22 with post operative abdominal fluid collection s/p IR drain placement 08/13/22 seen today for drain follow up.  Patient denies complaints regarding the drain, she is eager to go home.   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 Afebrile, WBC 11.6 yesterday.  Drain Location: RLQ Size: Fr size: 10 Fr Date of placement: 08/13/22  Currently to: Drain collection device: suction bulb 24 hour output:  Output by Drain (mL) 08/15/22 0701 - 08/15/22 1900 08/15/22  1901 - 08/16/22 0700 08/16/22 0701 - 08/16/22 1900 08/16/22 1901 - 08/17/22 0700 08/17/22 0701 - 08/17/22 1633  Closed System Drain 1 Right;Anterior RLQ Bulb (JP) 10 Fr. 7.5 35 20 35     Interval imaging/drain manipulation:  None  Current examination: Flushes/aspirates easily.  Insertion site unremarkable. Suture and stat lock in place. Dressed appropriately.   Plan: Patient for discharge today per primary team -- reviewed/demonstrated drain care today with patient who states understanding. She will flush the drain QD with 5 cc NS, record output each time bulb is emptied, dressing changes every 2-3 days or earlier if soiled/wet. Reviewed IR clinic follow up in about 7-10 days (IR scheduler will call to arrange this) and IR contact information (in AVS). All questions  answered.  Electronically Signed: Joaquim Nam, PA-C 08/17/2022, 4:02 PM   I spent a total of 25 Minutes at the the patient's bedside AND on the patient's hospital floor or unit, greater than 50% of which was counseling/coordinating care for intra-abdominal abscess.

## 2022-08-18 LAB — AEROBIC/ANAEROBIC CULTURE W GRAM STAIN (SURGICAL/DEEP WOUND)

## 2022-08-19 ENCOUNTER — Other Ambulatory Visit (HOSPITAL_COMMUNITY): Payer: Self-pay

## 2022-08-19 ENCOUNTER — Other Ambulatory Visit: Payer: Medicaid Other

## 2022-08-20 ENCOUNTER — Other Ambulatory Visit (HOSPITAL_COMMUNITY): Payer: Self-pay | Admitting: Obstetrics & Gynecology

## 2022-08-20 DIAGNOSIS — O9089 Other complications of the puerperium, not elsewhere classified: Secondary | ICD-10-CM

## 2022-08-21 ENCOUNTER — Other Ambulatory Visit (HOSPITAL_COMMUNITY): Payer: Self-pay

## 2022-08-21 ENCOUNTER — Other Ambulatory Visit: Payer: Self-pay

## 2022-08-25 ENCOUNTER — Other Ambulatory Visit: Payer: Medicaid Other

## 2022-08-25 MED FILL — Medication: Qty: 1 | Status: CN

## 2022-08-26 ENCOUNTER — Other Ambulatory Visit: Payer: Medicaid Other

## 2022-08-27 ENCOUNTER — Other Ambulatory Visit (HOSPITAL_COMMUNITY): Payer: Self-pay

## 2022-08-28 ENCOUNTER — Ambulatory Visit
Admission: RE | Admit: 2022-08-28 | Discharge: 2022-08-28 | Disposition: A | Discharge status: Home / Self Care | Payer: Medicaid Other | Source: Ambulatory Visit | Attending: Obstetrics & Gynecology | Admitting: Obstetrics & Gynecology

## 2022-08-28 ENCOUNTER — Ambulatory Visit
Admission: RE | Admit: 2022-08-28 | Discharge: 2022-08-28 | Disposition: A | Discharge status: Home / Self Care | Payer: Medicaid Other | Source: Ambulatory Visit | Attending: Physician Assistant | Admitting: Physician Assistant

## 2022-08-28 DIAGNOSIS — O9089 Other complications of the puerperium, not elsewhere classified: Secondary | ICD-10-CM

## 2022-08-28 MED ORDER — IOPAMIDOL (ISOVUE-300) INJECTION 61%
100.0000 mL | Freq: Once | INTRAVENOUS | Status: AC | PRN
  Administered 2022-08-28: 100 mL via INTRAVENOUS

## 2022-08-28 NOTE — Progress Notes (Signed)
Patient ID: Kristine Brown, female   DOB: July 28, 1990, 32 y.o.   MRN: [id number]        Chief Complaint: Drainage catheter evaluation and management  Referring Physician(s): Ozan,Jennifer  History of Present Illness: Kristine Brown is a 32 y.o. female with past med history significant for asthma, depression and HIV with recent C-section complicated by development fluid within the subcutaneous tissues of the ventral aspect of the lower abdomen/pelvis, post ultrasound-guided drainage catheter placement on 08/13/2022.   The patient continues to flush the drainage catheter with once per day.    She reports little to no output from the drainage catheter for the past several days.  Patient has been compliant with her prescribed course of antibiotics which she completes today.  She denies worsening abdominal pain, fever or chills.   Past Medical History:  Diagnosis Date   Anxiety    Asthma    Depression    HIV (human immunodeficiency virus infection) ([place])     Past Surgical History:  Procedure Laterality Date   CESAREAN SECTION N/A 08/04/2022   Procedure: CESAREAN SECTION;  Surgeon: Truett Mainland, DO;  Location: [facility] LD ORS;  Service: Obstetrics;  Laterality: N/A;   NO PAST SURGERIES      Allergies: Patient has no known allergies.  Medications: Prior to Admission medications   Medication Sig Start Date End Date Taking? Authorizing Provider  acetaminophen (TYLENOL) 500 MG tablet Take 2 tablets (1,000 mg total) by mouth every 6 (six) hours as needed. 04/07/22 04/07/23  Rasch, Anderson Malta I, NP  albuterol (VENTOLIN HFA) 108 (90 Base) MCG/ACT inhaler Inhale into the lungs. 08/20/20   [provider]  dolutegravir (TIVICAY) 50 MG tablet Take 1 tablet (50 mg total) by mouth daily. 04/06/22   Golden Circle, FNP  emtricitabine-tenofovir AF (DESCOVY) 200-25 MG tablet Take 1 tablet by mouth daily. 04/06/22   Golden Circle, FNP  ferrous sulfate 325 (65 FE) MG tablet Take 1 tablet (325  mg total) by mouth every other day. 08/07/22   Griffin Basil, MD  ibuprofen (ADVIL) 600 MG tablet Take 1 tablet (600 mg total) by mouth every 6 (six) hours. 08/07/22   Griffin Basil, MD     Family History  Problem Relation Age of Onset   Healthy Mother    Cancer Father    COPD Father    Healthy Daughter    Healthy Son    Asthma Neg Hx    Diabetes Neg Hx    Hearing loss Neg Hx    Hyperlipidemia Neg Hx    Stroke Neg Hx     Social History   Socioeconomic History   Marital status: Single    Spouse name: Not on file   Number of children: Not on file   Years of education: Not on file   Highest education level: Not on file  Occupational History   Not on file  Tobacco Use   Smoking status: Former    Types: Cigarettes   Smokeless tobacco: Never  Vaping Use   Vaping Use: Never used  Substance and Sexual Activity   Alcohol use: Never   Drug use: Not Currently    Types: Marijuana   Sexual activity: Yes    Birth control/protection: None    Comment: pt given condoms  Other Topics Concern   Not on file  Social History Narrative   Not on file   Social Determinants of Health   Financial Resource Strain: Not on file  Food Insecurity:  No Food Insecurity (08/16/2022)   Hunger Vital Sign    Worried About Running Out of Food in the Last Year: Never true    Ran Out of Food in the Last Year: Never true  Transportation Needs: No Transportation Needs (08/16/2022)   PRAPARE - Hydrologist (Medical): No    Lack of Transportation (Non-Medical): No  Physical Activity: Not on file  Stress: Not on file  Social Connections: Not on file    ECOG Status: 1 - Symptomatic but completely ambulatory  Review of Systems: A 12 point ROS discussed and pertinent positives are indicated in the HPI above.  All other systems are negative.  Review of Systems  Vital Signs: There were no vitals taken for this visit.    Physical Exam  Mallampati Score:      Imaging: CT ABDOMEN PELVIS W CONTRAST  Result Date: 08/15/2022 CLINICAL DATA:  Abdominal wall abscess status post percutaneous drainage EXAM: CT ABDOMEN AND PELVIS WITH CONTRAST TECHNIQUE: Multidetector CT imaging of the abdomen and pelvis was performed using the standard protocol following bolus administration of intravenous contrast. RADIATION DOSE REDUCTION: This exam was performed according to the departmental dose-optimization program which includes automated exposure control, adjustment of the mA and/or kV according to patient size and/or use of iterative reconstruction technique. CONTRAST:  53mL OMNIPAQUE IOHEXOL 350 MG/ML SOLN COMPARISON:  08/12/2022 FINDINGS: Lower chest: No acute abnormality. Hepatobiliary: No focal liver abnormality is seen. No gallstones, gallbladder wall thickening, or biliary dilatation. Pancreas: Unremarkable. No pancreatic ductal dilatation or surrounding inflammatory changes. Spleen: Normal in size without focal abnormality. Adrenals/Urinary Tract: Adrenal glands are unremarkable. Kidneys are normal, without renal calculi, focal lesion, or hydronephrosis. Bladder is unremarkable. Stomach/Bowel: Stomach is within normal limits. Appendix appears normal. No evidence of bowel wall thickening, distention, or inflammatory changes. Vascular/Lymphatic: No significant vascular findings are present. No enlarged abdominal or pelvic lymph nodes. Reproductive: The uterus is enlarged in keeping with history of recent gestation. No adnexal masses are seen. Other: Since the prior examination, a pigtail percutaneous drainage catheter has been placed into the right parasagittal aspect of the u shaped collection within the infraumbilical anterior abdominal wall along the expected surgical incision. The fluid component has significantly decreased in the interval, now measuring up to 17 mm in thickness to the right of midline and 10 mm in thickness to the left of midline. Superimposed surrounding  subcutaneous infiltration and dermal thickening appears improved in keeping with improving surrounding inflammatory change. No new fluid collections are identified. Musculoskeletal: No acute bone abnormality. No lytic or blastic bone lesion. IMPRESSION: 1. Interval placement of a pigtail percutaneous drainage catheter within the right parasagittal aspect of the U shaped collection within the infraumbilical anterior abdominal wall along the expected surgical incision. The fluid component has significantly decreased in the interval, now measuring up to 17 mm in thickness to the right of midline and 10 mm in thickness to the left of midline. Superimposed surrounding subcutaneous infiltration and dermal thickening appears improved in keeping with improving surrounding inflammatory change. No new fluid collections are identified. Electronically Signed   By: Fidela Salisbury M.D.   On: 08/15/2022 20:30   [place] Abscess Drain  Result Date: 08/13/2022 INDICATION: 32 year old with postoperative fluid collection at the C-section incision. Patient has brown drainage at the incision. Recent CT imaging demonstrates subcutaneous fluid deep to the incision. EXAM: Ultrasound-guided placement of drainage catheter in subcutaneous fluid collection MEDICATIONS: Moderate sedation ANESTHESIA/SEDATION: Moderate (conscious) sedation was employed during  this procedure. A total of Versed 2mg  and fentanyl 100 mcg was administered intravenously at the order of the provider performing the procedure. Total intra-service moderate sedation time: 20 minutes. Patient's level of consciousness and vital signs were monitored continuously by radiology nurse throughout the procedure under the supervision of the provider performing the procedure. COMPLICATIONS: None immediate. PROCEDURE: Informed written consent was obtained from the patient after a thorough discussion of the procedural risks, benefits and alternatives. All questions were addressed. Maximal  Sterile Barrier Technique was utilized including caps, mask, sterile gowns, sterile gloves, sterile drape, hand hygiene and skin antiseptic. A timeout was performed prior to the initiation of the procedure. Anterior pelvic region was evaluated with ultrasound. Heterogeneous fluid collection identified in the deep subcutaneous tissues in the right and left lower quadrants. Right lower quadrant fluid component appeared to be larger. The right side of the lower abdomen and pelvis was prepped with Betadine and sterile field was created. Skin was anesthetized with 1% lidocaine. Small incision was made. Using ultrasound guidance, a Yueh catheter was directed into the fluid collection and purulent looking brown fluid was aspirated. Superstiff Amplatz wire was placed and a 10 French drain was placed over the wire. 40 mL of brown purulent fluid was removed. Fluid collection appear to be significantly smaller following drain placement even on the left side. Drain was sutured to skin and attached to suction bulb. Fluid was sent for culture. Bandage was placed. FINDINGS: Heterogeneous fluid in the deep subcutaneous tissues in the anterior lower abdomen and pelvis near the surgical incision. Fluid collection was slightly larger on the right side than the left. Fluid collections decreased in size following placement of the right lower quadrant subcutaneous drain. IMPRESSION: Ultrasound-guided placement of a drainage catheter in the right anterior pelvic subcutaneous abscess. Fluid was sent for culture. Electronically Signed   By: Markus Daft M.D.   On: 08/13/2022 20:38   US Guided Needle Placement  Result Date: 08/13/2022 CLINICAL DATA:  Ultrasound was provided for use by the ordering physician.  No provider Interpretation or professional fees incurred.    CT ABDOMEN PELVIS W CONTRAST  Result Date: 08/12/2022 CLINICAL DATA:  Status post Caesarean section on 08/04/2022. Incisional pain and cellulitis with reassessment of  subcutaneous fluid. EXAM: CT ABDOMEN AND PELVIS WITH CONTRAST TECHNIQUE: Multidetector CT imaging of the abdomen and pelvis was performed using the standard protocol following bolus administration of intravenous contrast. RADIATION DOSE REDUCTION: This exam was performed according to the departmental dose-optimization program which includes automated exposure control, adjustment of the mA and/or kV according to patient size and/or use of iterative reconstruction technique. CONTRAST:  50mL OMNIPAQUE IOHEXOL 350 MG/ML SOLN COMPARISON:  08/10/2022 FINDINGS: Lower chest: No acute abnormality. Hepatobiliary: No focal liver abnormality is seen. No gallstones, gallbladder wall thickening, or biliary dilatation. Pancreas: Unremarkable. No pancreatic ductal dilatation or surrounding inflammatory changes. Spleen: Normal in size without focal abnormality. Adrenals/Urinary Tract: Adrenal glands are unremarkable. Kidneys are normal, without renal calculi, focal lesion, or hydronephrosis. Bladder is unremarkable. Stomach/Bowel: Bowel shows no evidence of obstruction, ileus, inflammation or lesion. The appendix is normal. No free intraperitoneal air. Vascular/Lymphatic: No significant vascular findings are present. No enlarged abdominal or pelvic lymph nodes. Reproductive: Stable appearance of recent gravid uterus. Other: Stable appearance of U-shaped subcutaneous fluid in the lower abdominal wall fat measuring up to approximately 2.3 cm in thickness to the right of midline and 2.6 cm to the left of midline. Fluid does not show significant increase in size  or peripheral enhancement. Degree of surrounding inflammation in the subcutaneous fat and overlying skin thickening may be slightly more pronounced by CT compared to the prior scan which may be consistent with some interval worsening of cellulitis. No new fluid collections are identified. Musculoskeletal: No acute or significant osseous findings. IMPRESSION: Stable appearance of  U-shaped subcutaneous fluid in the lower abdominal wall fat measuring up to approximately 2.3 cm in thickness to the right of midline and 2.6 cm to the left of midline. Fluid does not show significant increase in size or peripheral enhancement. Degree of surrounding inflammation in the subcutaneous fat and overlying skin thickening may be slightly more pronounced by CT compared to the prior scan which may be consistent with some interval worsening of cellulitis. No new fluid collections are identified. Electronically Signed   By: Aletta Edouard M.D.   On: 08/12/2022 17:07   CT ABDOMEN PELVIS W CONTRAST  Addendum Date: 08/11/2022   ADDENDUM REPORT: 08/11/2022 09:51 ADDENDUM: There are two fluid pockets on each side of the midline which are connected to each other inferiorly, the right pocket measures a proximally 2.5 x 3.1 x 2.7 cm (AP x TR x CC) and the left pocket measures 2.0 x 3.0 x 2.6 cm (AP x TR x CC). In the midline inferiorly where pockets are connected to each other, it measures 1.6 x 9.5 cm (AP x TR). Electronically Signed   By: Keane Police D.O.   On: 08/11/2022 09:51   Result Date: 08/11/2022 CLINICAL DATA:  Status post cesarean section, incisional infection. EXAM: CT ABDOMEN AND PELVIS WITH CONTRAST TECHNIQUE: Multidetector CT imaging of the abdomen and pelvis was performed using the standard protocol following bolus administration of intravenous contrast. RADIATION DOSE REDUCTION: This exam was performed according to the departmental dose-optimization program which includes automated exposure control, adjustment of the mA and/or kV according to patient size and/or use of iterative reconstruction technique. CONTRAST:  34mL OMNIPAQUE IOHEXOL 350 MG/ML SOLN COMPARISON:  CT examination dated February 07, 2020 FINDINGS: Lower chest: No acute abnormality. Hepatobiliary: No focal liver abnormality is seen. No gallstones, gallbladder wall thickening, or biliary dilatation. Pancreas: Unremarkable. No  pancreatic ductal dilatation or surrounding inflammatory changes. Spleen: Normal in size without focal abnormality. Adrenals/Urinary Tract: Adrenal glands are unremarkable. Kidneys are normal, without renal calculi, focal lesion, or hydronephrosis. Bladder is unremarkable. Stomach/Bowel: Stomach is within normal limits. Appendix not clearly identified. No evidence of bowel wall thickening, distention, or inflammatory changes. Vascular/Lymphatic: No significant vascular findings are present. No enlarged abdominal or pelvic lymph nodes. Reproductive: Large heterogeneously enhancing post gravid uterus with fluid in the endometrial canal. Other: There is skin thickening and marked subcutaneous soft tissue edema. Subcutaneous fluid collection about the site of prior C-section incision which may be postsurgical hematoma/seroma. Scattered area of subcutaneous gas, likely postsurgical. Musculoskeletal: No acute or significant osseous findings. IMPRESSION: 1. Skin thickening and marked subcutaneous soft tissue edema. Subcutaneous fluid collection about the site of prior C-section incision which may be postsurgical hematoma/seroma. Scattered area of subcutaneous gas, likely postsurgical. Infectious/inflammatory process can not be completely excluded, clinical correlation is suggested 2. Large heterogeneously enhancing post gravid uterus with fluid in the endometrial canal. Electronically Signed: By: Keane Police D.O. On: 08/10/2022 23:47    Labs:  CBC: Recent Labs    08/14/22 0525 08/15/22 0458 08/16/22 0402 08/17/22 0726  WBC 16.4* 13.6* 11.6* 9.5  HGB 8.6* 9.0* 9.2* 9.5*  HCT 24.7* 25.6* 27.6* 27.8*  PLT 480* 512* 524* 523*  COAGS: No results for input(s): "INR", "APTT" in the last 8760 hours.  BMP: Recent Labs    08/12/22 0419 08/13/22 0513 08/14/22 0525 08/15/22 0458  NA 137 136 135 136  K 3.4* 3.3* 3.8 3.2*  CL 105 105 106 103  CO2 23 18* 20* 21*  GLUCOSE 107* 90 146* 136*  BUN 7 8 8  5*   CALCIUM 8.5* 8.6* 8.8* 8.7*  CREATININE 0.69 0.64 0.78 0.84  GFRNONAA >60 >60 >60 >60    LIVER FUNCTION TESTS: Recent Labs    04/07/22 0730 04/09/22 0921 06/01/22 2234 08/10/22 1922  BILITOT 0.3 0.5 0.3 0.7  AST 21 21 20 29   ALT 14 19 15  32  ALKPHOS 41 45 43 85  PROT 7.6 7.4 6.5 6.6  ALBUMIN 4.0 4.2 3.3* 2.6*    TUMOR MARKERS: No results for input(s): "AFPTM", "CEA", "CA199", "CHROMGRNA" in the last 8760 hours.  Assessment and Plan:  Maaya Bagot is a 32 y.o. female with past med history significant for asthma, depression and HIV with recent C-section complicated by development fluid within the subcutaneous tissues of the ventral aspect of the lower abdomen/pelvis, post ultrasound-guided drainage catheter placement on 08/13/2022.   She reports little to no output from the drainage catheter for the past several days.    CT scan of the abdomen pelvis performed earlier today demonstrates stable positioning of right lower abdominal/pelvic ventral wall drainage catheter with near complete resolution of previously noted subcutaneous fluid without new definable/drainable fluid collection.   Given above findings as well as lack of output from the drainage catheter for the past several days the decision was made to remove the drainage catheter.  This was performed at the patient's bedside without incident.  A dressing was applied.  The patient tolerated the procedure well without immediate postprocedural complication.   The patient was encouraged to maintain all follow-up appointments with her referring gynecologist.    A copy of this report was sent to the requesting provider on this date.  Electronically Signed: Sandi Mariscal 08/28/2022, 2:36 PM   I spent a total of 10 Minutes in face to face in clinical consultation, greater than 50% of which was counseling/coordinating care for drainage catheter evaluation and management.

## 2022-09-01 ENCOUNTER — Other Ambulatory Visit: Payer: Medicaid Other

## 2022-09-01 ENCOUNTER — Encounter: Payer: Medicaid Other | Admitting: Obstetrics and Gynecology

## 2022-09-03 IMAGING — US US OB TRANSVAGINAL
1 series · 15 of 28 positions shown · non-contrast
Comparison: 04/17/2021

CLINICAL DATA: First trimester pregnancy with inconclusive fetal
viability.

EXAM:
TRANSVAGINAL OB ULTRASOUND
TECHNIQUE: Transvaginal ultrasound was performed for complete evaluation of the
gestation as well as the maternal uterus, adnexal regions, and
pelvic cul-de-sac.

[Series 1: us ob transvaginal · 15 of 64 slices shown]
[im 1/64]
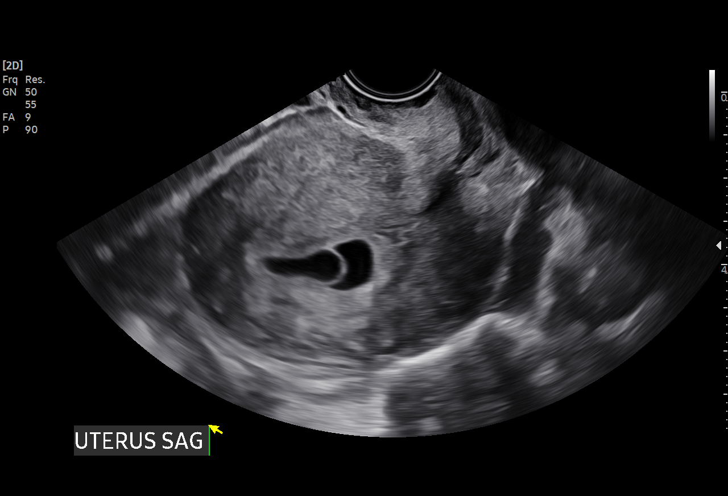
[im 5/64]
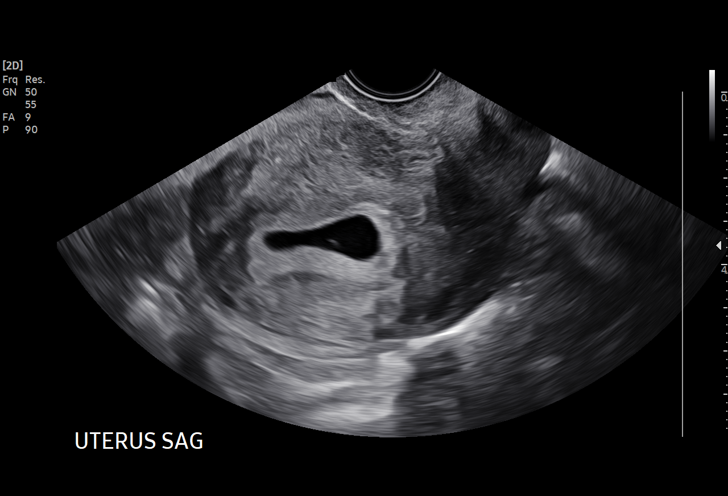
[im 10/64]
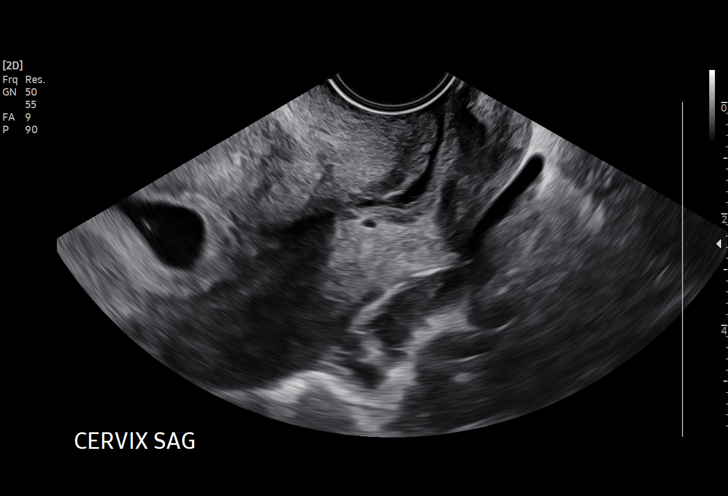
[im 15/64]
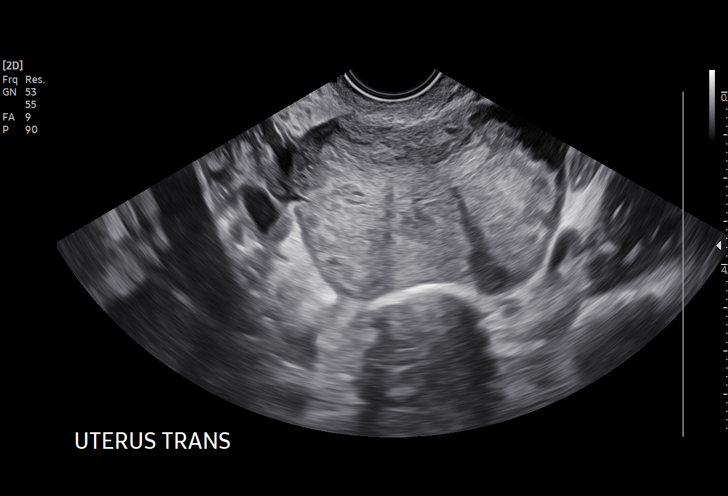
[im 19/64]
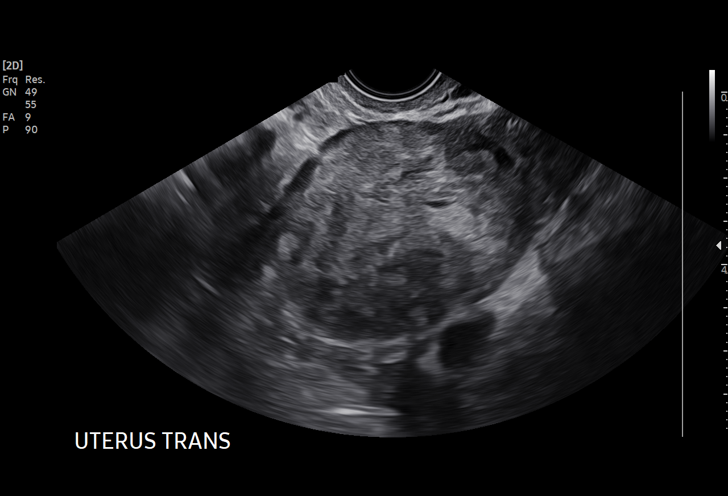
[im 24/64]
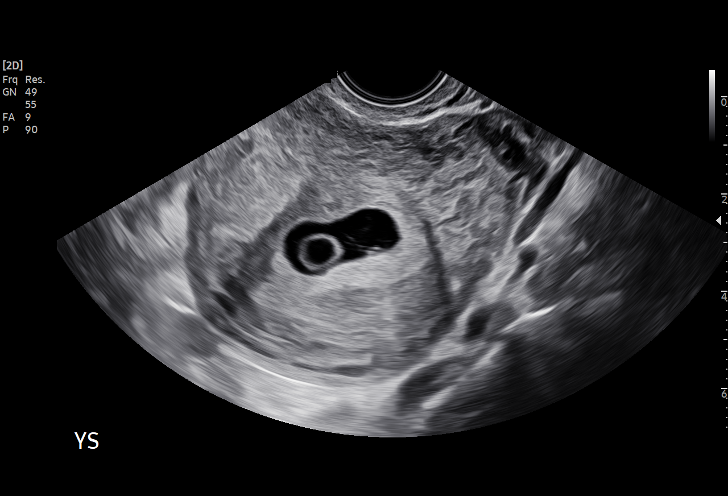
[im 29/64]
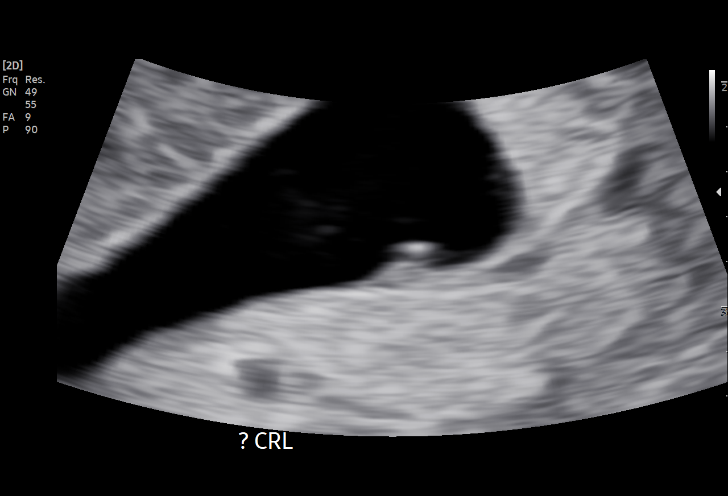
[im 33/64]
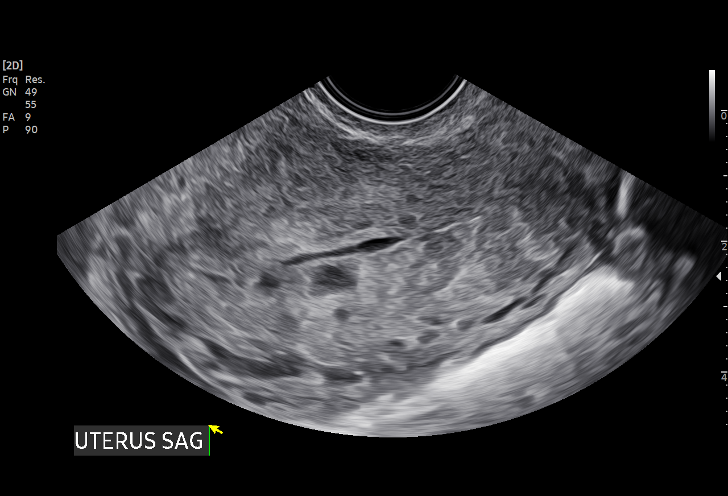
[im 36/64]
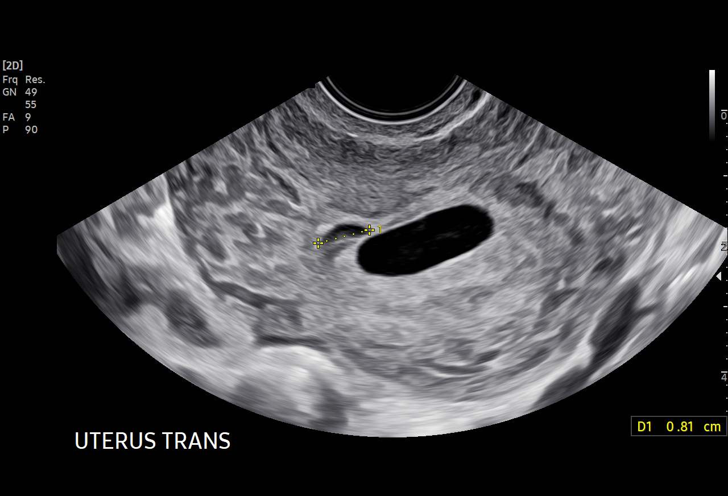
[im 40/64]
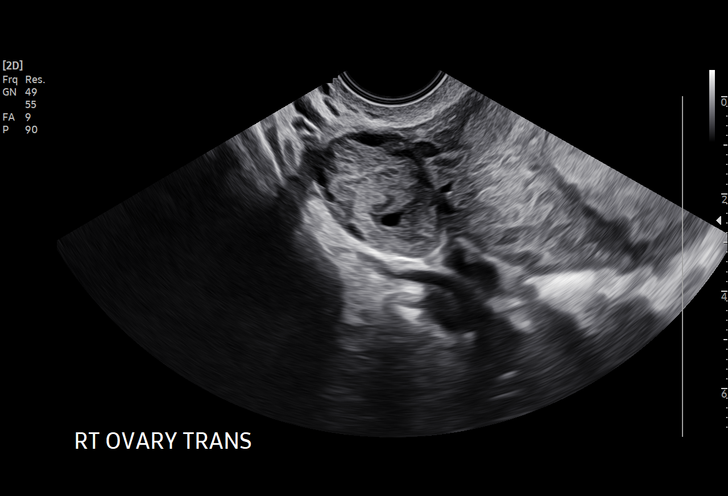
[im 45/64]
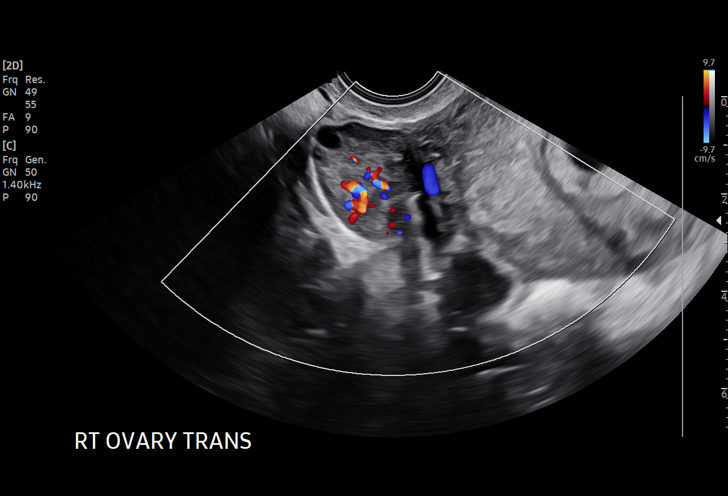
[im 50/64]
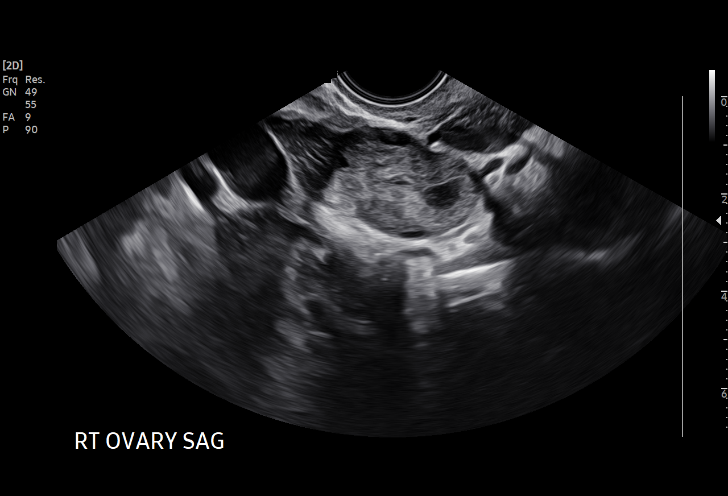
[im 54/64]
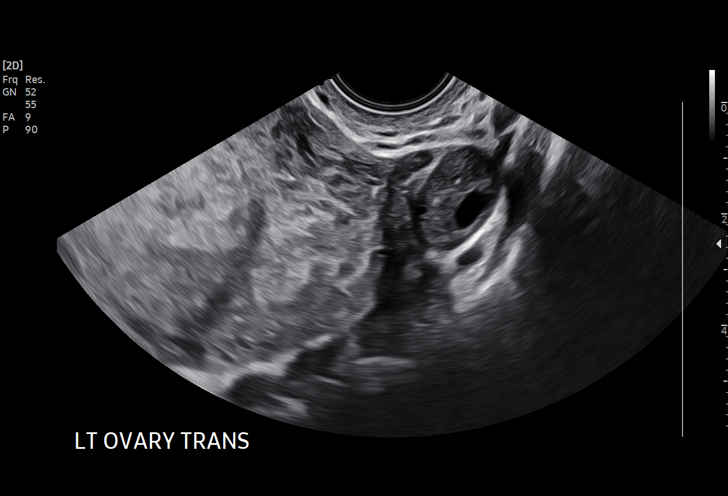
[im 59/64]
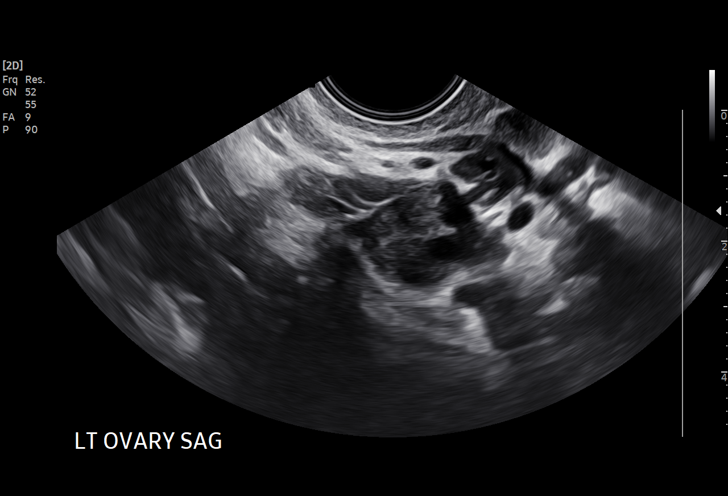
[im 64/64]
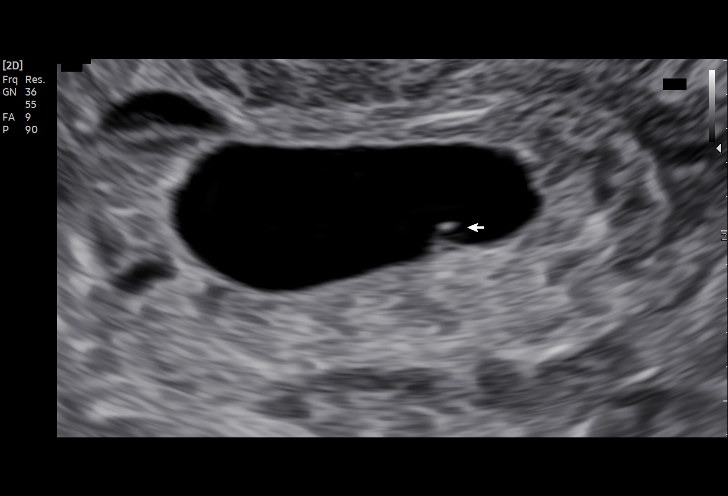

[15 of 28 positions shown; findings below may reference images not displayed]

FINDINGS: Intrauterine gestational sac: Single, with increased size since
prior study

Yolk sac:  Enlarged yolk sac measuring 8 mm

Embryo:  Visualized.

Cardiac Activity: Not Visualized.

CRL:   2 mm   out of US range

Subchorionic hemorrhage:  Tiny subchorionic hemorrhage.

Maternal uterus/adnexae: Small right ovarian corpus luteum cyst.
Normal appearance of left ovary. No adnexal mass or abnormal free
fluid identified.
IMPRESSION: Findings are highly suspicious, but not yet definitive, for failed
pregnancy. Recommend follow-up US in 7 days for definitive
diagnosis. This recommendation follows SRU consensus guidelines:
Diagnostic Criteria for Nonviable Pregnancy Early in the First
Trimester. N Engl J Med 5284; [DATE].

## 2022-09-10 ENCOUNTER — Other Ambulatory Visit: Payer: Self-pay

## 2022-09-10 ENCOUNTER — Other Ambulatory Visit: Payer: Self-pay | Admitting: Family

## 2022-09-10 ENCOUNTER — Other Ambulatory Visit (HOSPITAL_COMMUNITY): Payer: Self-pay

## 2022-09-10 DIAGNOSIS — Z21 Asymptomatic human immunodeficiency virus [HIV] infection status: Secondary | ICD-10-CM

## 2022-09-10 MED ORDER — DESCOVY 200-25 MG PO TABS
1.0000 | ORAL_TABLET | Freq: Every day | ORAL | 0 refills | Status: DC
  Filled 2022-09-10: qty 30, 30d supply, fill #0

## 2022-09-10 MED ORDER — TIVICAY 50 MG PO TABS
50.0000 mg | ORAL_TABLET | Freq: Every day | ORAL | 0 refills | Status: DC
  Filled 2022-09-10 – 2022-09-18 (×2): qty 30, 30d supply, fill #0

## 2022-09-14 ENCOUNTER — Ambulatory Visit: Payer: Self-pay | Admitting: Obstetrics & Gynecology

## 2022-09-18 ENCOUNTER — Other Ambulatory Visit: Payer: Self-pay

## 2022-09-18 ENCOUNTER — Other Ambulatory Visit (HOSPITAL_COMMUNITY): Payer: Self-pay

## 2022-10-13 ENCOUNTER — Other Ambulatory Visit (HOSPITAL_COMMUNITY): Payer: Self-pay

## 2022-10-15 ENCOUNTER — Other Ambulatory Visit (HOSPITAL_COMMUNITY): Payer: Self-pay

## 2022-10-16 ENCOUNTER — Ambulatory Visit: Payer: Medicaid Other | Admitting: Family

## 2022-10-16 ENCOUNTER — Other Ambulatory Visit (HOSPITAL_COMMUNITY): Payer: Self-pay

## 2022-10-16 ENCOUNTER — Other Ambulatory Visit: Payer: Self-pay | Admitting: Family

## 2022-10-16 ENCOUNTER — Other Ambulatory Visit: Payer: Self-pay

## 2022-10-16 DIAGNOSIS — Z21 Asymptomatic human immunodeficiency virus [HIV] infection status: Secondary | ICD-10-CM

## 2022-10-16 MED ORDER — DESCOVY 200-25 MG PO TABS
1.0000 | ORAL_TABLET | Freq: Every day | ORAL | 0 refills | Status: DC
  Filled 2022-10-16: qty 30, 30d supply, fill #0

## 2022-10-16 MED ORDER — TIVICAY 50 MG PO TABS
50.0000 mg | ORAL_TABLET | Freq: Every day | ORAL | 0 refills | Status: DC
  Filled 2022-10-16: qty 30, 30d supply, fill #0

## 2022-10-21 ENCOUNTER — Other Ambulatory Visit (HOSPITAL_COMMUNITY): Payer: Self-pay

## 2022-10-21 ENCOUNTER — Ambulatory Visit: Payer: Medicaid Other | Admitting: Family

## 2022-11-12 ENCOUNTER — Other Ambulatory Visit (HOSPITAL_COMMUNITY): Payer: Self-pay

## 2022-11-16 ENCOUNTER — Other Ambulatory Visit: Payer: Self-pay

## 2022-12-10 ENCOUNTER — Other Ambulatory Visit (HOSPITAL_COMMUNITY): Payer: Self-pay

## 2022-12-17 ENCOUNTER — Other Ambulatory Visit: Payer: Self-pay

## 2022-12-17 ENCOUNTER — Ambulatory Visit (INDEPENDENT_AMBULATORY_CARE_PROVIDER_SITE_OTHER): Payer: Medicaid Other | Admitting: Family

## 2022-12-17 ENCOUNTER — Other Ambulatory Visit (HOSPITAL_COMMUNITY): Payer: Self-pay

## 2022-12-17 ENCOUNTER — Encounter: Payer: Self-pay | Admitting: Family

## 2022-12-17 VITALS — BP 120/77 | HR 63 | Temp 97.8°F | Ht 62.0 in | Wt 152.0 lb

## 2022-12-17 DIAGNOSIS — Z0000 Encounter for general adult medical examination without abnormal findings: Secondary | ICD-10-CM

## 2022-12-17 DIAGNOSIS — Z21 Asymptomatic human immunodeficiency virus [HIV] infection status: Secondary | ICD-10-CM

## 2022-12-17 MED ORDER — TIVICAY 50 MG PO TABS
50.0000 mg | ORAL_TABLET | Freq: Every day | ORAL | 5 refills | Status: DC
  Filled 2022-12-17 (×6): qty 30, 30d supply, fill #0
  Filled 2023-01-05: qty 30, 30d supply, fill #1
  Filled 2023-02-08: qty 30, 30d supply, fill #2
  Filled 2023-03-03: qty 30, 30d supply, fill #3
  Filled 2023-03-25: qty 30, 30d supply, fill #4
  Filled 2023-04-20: qty 30, 30d supply, fill #5

## 2022-12-17 MED ORDER — DESCOVY 200-25 MG PO TABS
1.0000 | ORAL_TABLET | Freq: Every day | ORAL | 5 refills | Status: DC
  Filled 2022-12-17 (×7): qty 30, 30d supply, fill #0
  Filled 2023-01-05: qty 30, 30d supply, fill #1
  Filled 2023-02-08: qty 30, 30d supply, fill #2
  Filled 2023-03-03: qty 30, 30d supply, fill #3
  Filled 2023-03-25 (×2): qty 30, 30d supply, fill #4
  Filled 2023-04-20: qty 30, 30d supply, fill #5

## 2022-12-17 NOTE — Progress Notes (Signed)
Brief Narrative   Patient ID: Kristine Brown, female    DOB: May 08, 1991, 32 y.o.   MRN: [id number]  Kristine Brown is a 32 y/o AA female diagnosed with HIV in May of 2019 with risk factor of heterosexual contact. Initial CD4 count of 769 and viral load of 16,400. Genotype with no significant resistance patterns. Entered care at  [facility] Stage 1. [id number] negative. Sole medication regimen of Biktarvy.    Subjective:    Chief Complaint  Patient presents with   Follow-up   HIV Positive/AIDS    HPI:  Kristine Brown is a 32 y.o. female with HIV disease last seen on 04/29/22 with well controlled virus and good adherence and tolerance to Tivicay and Descovy. Viral load was undetectable and CD4 count 105. Pregnant at the time of last visit and delivered at prematurely and infant did not survive. Here today for routine follow up.  Kristine Brown been doing well since last office visit with good adherence and tolerance to Tivicay and Descovy. Thinking about trying again in the future and wishing to stay on Tivicay and Descovy at this time. No new concerns/complaints. Condoms and STD testing offered.   Denies fevers, chills, night sweats, headaches, changes in vision, neck pain/stiffness, nausea, diarrhea, vomiting, lesions or rashes   No Known Allergies    Outpatient Medications Prior to Visit  Medication Sig Dispense Refill   acetaminophen (TYLENOL) 500 MG tablet Take 2 tablets (1,000 mg total) by mouth every 6 (six) hours as needed. 100 tablet 2   albuterol (VENTOLIN HFA) 108 (90 Base) MCG/ACT inhaler Inhale into the lungs.     ferrous sulfate 325 (65 FE) MG tablet Take 1 tablet (325 mg total) by mouth every other day. 30 tablet 3   ibuprofen (ADVIL) 600 MG tablet Take 1 tablet (600 mg total) by mouth every 6 (six) hours. 30 tablet 0   dolutegravir (TIVICAY) 50 MG tablet Take 1 tablet (50 mg total) by mouth daily. 30 tablet 0   emtricitabine-tenofovir AF (DESCOVY) 200-25 MG tablet Take 1 tablet  by mouth daily. 30 tablet 0   No facility-administered medications prior to visit.     Past Medical History:  Diagnosis Date   Anxiety    Asthma    Depression    HIV (human immunodeficiency virus infection) (HCC)      Past Surgical History:  Procedure Laterality Date   CESAREAN SECTION N/A 08/04/2022   Procedure: CESAREAN SECTION;  Surgeon: Levie Heritage, DO;  Location: [facility] LD ORS;  Service: Obstetrics;  Laterality: N/A;   NO PAST SURGERIES        Review of Systems  Constitutional:  Negative for appetite change, chills, diaphoresis, fatigue, fever and unexpected weight change.  Eyes:        Negative for acute change in vision  Respiratory:  Negative for chest tightness, shortness of breath and wheezing.   Cardiovascular:  Negative for chest pain.  Gastrointestinal:  Negative for diarrhea, nausea and vomiting.  Genitourinary:  Negative for dysuria, pelvic pain and vaginal discharge.  Musculoskeletal:  Negative for neck pain and neck stiffness.  Skin:  Negative for rash.  Neurological:  Negative for seizures, syncope, weakness and headaches.  Hematological:  Negative for adenopathy. Does not bruise/bleed easily.  Psychiatric/Behavioral:  Negative for hallucinations.       Objective:    BP 120/77   Pulse 63   Temp 97.8 F (36.6 C) (Temporal)   Ht 5\' 2"  (1.575 m)   Wt 152  lb (68.9 kg)   SpO2 98%   BMI 27.80 kg/m  Nursing note and vital signs reviewed.  Physical Exam Constitutional:      General: She is not in acute distress.    Appearance: She is well-developed.  Eyes:     Conjunctiva/sclera: Conjunctivae normal.  Cardiovascular:     Rate and Rhythm: Normal rate and regular rhythm.     Heart sounds: Normal heart sounds. No murmur heard.    No friction rub. No gallop.  Pulmonary:     Effort: Pulmonary effort is normal. No respiratory distress.     Breath sounds: Normal breath sounds. No wheezing or rales.  Chest:     Chest wall: No tenderness.  Abdominal:      General: Bowel sounds are normal.     Palpations: Abdomen is soft.     Tenderness: There is no abdominal tenderness.  Musculoskeletal:     Cervical back: Neck supple.  Lymphadenopathy:     Cervical: No cervical adenopathy.  Skin:    General: Skin is warm and dry.     Findings: No rash.  Neurological:     Mental Status: She is alert and oriented to person, place, and time.  Psychiatric:        Behavior: Behavior normal.        Thought Content: Thought content normal.        Judgment: Judgment normal.         12/17/2022    9:09 AM 05/14/2022    4:46 PM 09/08/2021    3:19 PM 07/22/2021    9:24 AM 03/05/2021    4:06 PM  Depression screen PHQ 2/9  Decreased Interest 0 1 1 0 2  Down, Depressed, Hopeless 0 0 1 0 2  PHQ - 2 Score 0 1 2 0 4  Altered sleeping  0 1    Tired, decreased energy  1 1    Change in appetite  0 1    Feeling bad or failure about yourself   0 0    Trouble concentrating  0 1    Moving slowly or fidgety/restless  1 0    Suicidal thoughts  0 0    PHQ-9 Score  3 6         Assessment & Plan:    Patient Active Problem List   Diagnosis Date Noted   MRSA cellulitis 08/16/2022   Infected incision 08/10/2022   Sepsis after cesarean section 08/10/2022   Placental abruption, delivered 08/04/2022   Delivery by classical cesarean section 08/04/2022   Alpha thalassemia silent carrier 06/02/2022   Supervision of high risk pregnancy, antepartum 04/29/2022   Healthcare maintenance 07/22/2021   Depression 11/17/2019   Anxiety 05/12/2018   Asymptomatic HIV infection (HCC) 05/12/2018     Problem List Items Addressed This Visit       Other   Asymptomatic HIV infection (HCC) - Primary    Kristine Brown continues to have well controlled virus with good adherence and tolerance to Tivicay and Descovy. Reviewed previous lab work and discussed plan of care and U equals U. 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 Continue current dose of Tivicay and  Descovy. Plan for follow up in 4 months or sooner if needed with lab work on the same day.       Relevant Medications   dolutegravir (TIVICAY) 50 MG tablet   emtricitabine-tenofovir AF (DESCOVY) 200-25 MG tablet   Other Relevant Orders  COMPLETE METABOLIC PANEL WITH GFR   HIV-1 RNA quant-no reflex-bld   T-helper cell (CD4)- ([facility] clinic only)   Healthcare maintenance    Discussed importance of safe sexual practice and condom use. Condoms and STD testing offered.         I am having Kristine Brown maintain her albuterol, acetaminophen, ibuprofen, ferrous sulfate, Tivicay, and Descovy.   Meds ordered this encounter  Medications   dolutegravir (TIVICAY) 50 MG tablet    Sig: Take 1 tablet (50 mg total) by mouth daily.    Dispense:  30 tablet    Refill:  5    Will pick up today    Order Specific Question:   Supervising Provider    Answer:   Judyann Munson [[id number]]   emtricitabine-tenofovir AF (DESCOVY) 200-25 MG tablet    Sig: Take 1 tablet by mouth daily.    Dispense:  30 tablet    Refill:  5    Will pick up today    Order Specific Question:   Supervising Provider    Answer:   Judyann Munson [[id number]]     Follow-up: Return in about 4 months (around 04/19/2023), or if symptoms worsen or fail to improve.   Marcos Eke, MSN, FNP-C Nurse Practitioner [facility] Main number: [phone]

## 2022-12-17 NOTE — Patient Instructions (Signed)
Nice to see you.  We will check your lab work today.  Continue to take your medication daily as prescribed.  Refills have been sent to the pharmacy.  Plan for follow up in 1 months or sooner if needed with lab work 1-2 weeks prior to appointment.   Plan for follow up in 4 months or sooner if needed with lab work on the same day.  Have a great day and stay safe!

## 2022-12-17 NOTE — Assessment & Plan Note (Signed)
Discussed importance of safe sexual practice and condom use. Condoms and STD testing offered.

## 2022-12-17 NOTE — Assessment & Plan Note (Addendum)
Leroy continues to have well controlled virus with good adherence and tolerance to Tivicay and Descovy. Reviewed previous lab work and discussed plan of care and U equals U. 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 Continue current dose of Tivicay and Descovy. Plan for follow up in 4 months or sooner if needed with lab work on the same day.

## 2022-12-22 ENCOUNTER — Other Ambulatory Visit (HOSPITAL_COMMUNITY): Payer: Self-pay

## 2023-01-04 ENCOUNTER — Other Ambulatory Visit: Payer: Self-pay

## 2023-01-05 ENCOUNTER — Other Ambulatory Visit (HOSPITAL_COMMUNITY): Payer: Self-pay

## 2023-01-11 ENCOUNTER — Other Ambulatory Visit (HOSPITAL_COMMUNITY): Payer: Self-pay

## 2023-01-27 ENCOUNTER — Other Ambulatory Visit (HOSPITAL_COMMUNITY): Payer: Self-pay

## 2023-02-04 ENCOUNTER — Other Ambulatory Visit (HOSPITAL_COMMUNITY): Payer: Self-pay

## 2023-02-08 ENCOUNTER — Other Ambulatory Visit (HOSPITAL_COMMUNITY): Payer: Self-pay

## 2023-02-15 ENCOUNTER — Other Ambulatory Visit (INDEPENDENT_AMBULATORY_CARE_PROVIDER_SITE_OTHER): Payer: Medicaid Other

## 2023-02-15 ENCOUNTER — Other Ambulatory Visit: Payer: Self-pay

## 2023-02-15 DIAGNOSIS — N926 Irregular menstruation, unspecified: Secondary | ICD-10-CM

## 2023-02-15 DIAGNOSIS — Z21 Asymptomatic human immunodeficiency virus [HIV] infection status: Secondary | ICD-10-CM

## 2023-02-15 LAB — POCT URINE PREGNANCY: Preg Test, Ur: NEGATIVE

## 2023-02-16 LAB — T-HELPER CELL (CD4) - (RCID CLINIC ONLY)
CD4 % Helper T Cell: 41 % (ref 33–65)
CD4 T Cell Abs: 1059 /uL (ref 400–1790)

## 2023-02-17 LAB — COMPLETE METABOLIC PANEL WITH GFR
AG Ratio: 1.6 (calc) (ref 1.0–2.5)
ALT: 12 U/L (ref 6–29)
AST: 16 U/L (ref 10–30)
Albumin: 4.8 g/dL (ref 3.6–5.1)
Alkaline phosphatase (APISO): 58 U/L (ref 31–125)
BUN: 8 mg/dL (ref 7–25)
CO2: 26 mmol/L (ref 20–32)
Calcium: 9.7 mg/dL (ref 8.6–10.2)
Chloride: 104 mmol/L (ref 98–110)
Creat: 0.89 mg/dL (ref 0.50–0.97)
Globulin: 3 g/dL (ref 1.9–3.7)
Glucose, Bld: 69 mg/dL (ref 65–99)
Potassium: 3.7 mmol/L (ref 3.5–5.3)
Sodium: 139 mmol/L (ref 135–146)
Total Bilirubin: 0.3 mg/dL (ref 0.2–1.2)
Total Protein: 7.8 g/dL (ref 6.1–8.1)
eGFR: 87 mL/min/{1.73_m2} (ref >=60)

## 2023-03-03 ENCOUNTER — Other Ambulatory Visit: Payer: Self-pay

## 2023-03-03 ENCOUNTER — Other Ambulatory Visit (HOSPITAL_COMMUNITY): Payer: Self-pay

## 2023-03-03 ENCOUNTER — Other Ambulatory Visit: Payer: Self-pay | Admitting: Pharmacy Technician

## 2023-03-03 NOTE — Progress Notes (Signed)
Specialty Pharmacy Refill Coordination Note  Kristine Brown is a 32 y.o. female contacted today regarding refills of specialty medication(s) Dolutegravir Sodium; Emtricitabine-Tenofovir Af   Patient requested Delivery   Delivery date: 03/09/23   Verified address: [address] [place], [place]   Medication will be filled on 03/08/23.

## 2023-03-25 ENCOUNTER — Other Ambulatory Visit: Payer: Self-pay

## 2023-03-25 NOTE — Progress Notes (Signed)
Specialty Pharmacy Refill Coordination Note  Kristine Brown is a 32 y.o. female contacted today regarding refills of specialty medication(s) Dolutegravir Sodium; Emtricitabine-Tenofovir Af   Patient requested Delivery   Delivery date: 04/01/23   Verified address: [address]   Medication will be filled on 03/31/23.

## 2023-03-25 NOTE — Progress Notes (Signed)
Specialty Pharmacy Ongoing Clinical Assessment Note  Kristine Brown is a 32 y.o. female who is being followed by the specialty pharmacy service for RxSp HIV   Patient's specialty medication(s) reviewed today: Dolutegravir Sodium; Emtricitabine-Tenofovir Af   Missed doses in the last 4 weeks: 0   Patient/Caregiver did not have any additional questions or concerns.   Therapeutic benefit summary: Patient is achieving benefit   Adverse events/side effects summary: No adverse events/side effects   Patient's therapy is appropriate to: Continue    Goals Addressed             This Visit's Progress    Achieve Undetectable HIV Viral Load < 20       Patient is on track. Patient will maintain adherence         Follow up:  6 months  Bobette Mo Specialty Pharmacist

## 2023-04-20 ENCOUNTER — Other Ambulatory Visit: Payer: Self-pay

## 2023-04-20 NOTE — Progress Notes (Signed)
Specialty Pharmacy Refill Coordination Note  Kristine Brown is a 32 y.o. female contacted today regarding refills of specialty medication(s) Dolutegravir Sodium; Emtricitabine-Tenofovir Af   Patient requested Delivery   Delivery date: 04/28/23   Verified address: [address]   Medication will be filled on 04/27/23.

## 2023-04-27 ENCOUNTER — Other Ambulatory Visit: Payer: Self-pay

## 2023-05-19 ENCOUNTER — Telehealth: Payer: Self-pay

## 2023-05-19 ENCOUNTER — Telehealth: Payer: Medicaid Other | Admitting: Nurse Practitioner

## 2023-05-19 DIAGNOSIS — J020 Streptococcal pharyngitis: Secondary | ICD-10-CM

## 2023-05-19 MED ORDER — PENICILLIN V POTASSIUM 500 MG PO TABS: 500.0000 mg | ORAL_TABLET | Freq: Three times a day (TID) | ORAL | 0 refills | Status: AC

## 2023-05-19 NOTE — Telephone Encounter (Signed)
Patient called stating that she has a sore throat, running a fever since yesterday. Advised patient it may be best to go to UC as our office closes early today. Patient agreed to go to UC.    Quentina Fronek Lesli Albee, CMA

## 2023-05-19 NOTE — Progress Notes (Signed)
Virtual Visit Consent   Kristine Brown, you are scheduled for a virtual visit with a [facility] provider today. Just as with appointments in the office, your consent must be obtained to participate. Your consent will be active for this visit and any virtual visit you may have with one of our providers in the next 365 days. If you have a MyChart account, a copy of this consent can be sent to you electronically.  As this is a virtual visit, video technology does not allow for your provider to perform a traditional examination. This may limit your provider's ability to fully assess your condition. If your provider identifies any concerns that need to be evaluated in person or the need to arrange testing (such as labs, EKG, etc.), we will make arrangements to do so. Although advances in technology are sophisticated, we cannot ensure that it will always work on either your end or our end. If the connection with a video visit is poor, the visit may have to be switched to a telephone visit. With either a video or telephone visit, we are not always able to ensure that we have a secure connection.  By engaging in this virtual visit, you consent to the provision of healthcare and authorize for your insurance to be billed (if applicable) for the services provided during this visit. Depending on your insurance coverage, you may receive a charge related to this service.  I need to obtain your verbal consent now. Are you willing to proceed with your visit today? Kristine Brown has provided verbal consent on 05/19/2023 for a virtual visit (video or telephone). Viviano Simas, FNP  Date: 05/19/2023 4:08 PM  Virtual Visit via Video Note   I, Viviano Simas, connected with  Kristine Brown  ([id number], [birth year]) on 05/19/23 at  4:15 PM EST by a video-enabled telemedicine application and verified that I am speaking with the correct person using two identifiers.  Location: Patient: Virtual Visit Location Patient:  Home Provider: Virtual Visit Location Provider: Home Office   I discussed the limitations of evaluation and management by telemedicine and the availability of in person appointments. The patient expressed understanding and agreed to proceed.    History of Present Illness: Kristine Brown is a 32 y.o. who identifies as a female who was assigned female at birth, and is being seen today for fever and sore throat.   Symptom onset was yesterday  Hurts to swallow  She does have small children at home   This is the worst sore throat she has ever experienced   She does not have a cough  She does have her tonsils    Feels her lymph nodes in the neck are swollen  Problems:  Patient Active Problem List   Diagnosis Date Noted   MRSA cellulitis 08/16/2022   Infected incision 08/10/2022   Sepsis after cesarean section 08/10/2022   Placental abruption, delivered 08/04/2022   Delivery by classical cesarean section 08/04/2022   Alpha thalassemia silent carrier 06/02/2022   Supervision of high risk pregnancy, antepartum 04/29/2022   Healthcare maintenance 07/22/2021   Depression 11/17/2019   Anxiety 05/12/2018   Asymptomatic HIV infection (HCC) 05/12/2018    Allergies: No Known Allergies  Medications:  Current Outpatient Medications:    albuterol (VENTOLIN HFA) 108 (90 Base) MCG/ACT inhaler, Inhale into the lungs., Disp: , Rfl:    dolutegravir (TIVICAY) 50 MG tablet, Take 1 tablet (50 mg total) by mouth daily., Disp: 30 tablet, Rfl: 5   emtricitabine-tenofovir AF (DESCOVY) 200-25  MG tablet, Take 1 tablet by mouth daily., Disp: 30 tablet, Rfl: 5   ferrous sulfate 325 (65 FE) MG tablet, Take 1 tablet (325 mg total) by mouth every other day., Disp: 30 tablet, Rfl: 3   ibuprofen (ADVIL) 600 MG tablet, Take 1 tablet (600 mg total) by mouth every 6 (six) hours., Disp: 30 tablet, Rfl: 0  Observations/Objective: Patient is well-developed, well-nourished in no acute distress.  Resting comfortably   at home.  Head is normocephalic, atraumatic.  No labored breathing.  Speech is clear and coherent with logical content.  Patient is alert and oriented at baseline.    Assessment and Plan:  1. Strep throat (Primary)  Using [facility] criteria will cover for strep  +ST -cough+fever    Meds ordered this encounter  Medications   penicillin v potassium (VEETID) 500 MG tablet    Sig: Take 1 tablet (500 mg total) by mouth 3 (three) times daily for 10 days.    Dispense:  30 tablet    Refill:  0    Follow Up Instructions: I discussed the assessment and treatment plan with the patient. The patient was provided an opportunity to ask questions and all were answered. The patient agreed with the plan and demonstrated an understanding of the instructions.  A copy of instructions were sent to the patient via MyChart unless otherwise noted below.    The patient was advised to call back or seek an in-person evaluation if the symptoms worsen or if the condition fails to improve as anticipated.    Viviano Simas, FNP

## 2023-05-24 ENCOUNTER — Other Ambulatory Visit: Payer: Self-pay | Admitting: Family

## 2023-05-24 ENCOUNTER — Other Ambulatory Visit: Payer: Self-pay

## 2023-05-24 DIAGNOSIS — Z21 Asymptomatic human immunodeficiency virus [HIV] infection status: Secondary | ICD-10-CM

## 2023-05-24 NOTE — Progress Notes (Signed)
Specialty Pharmacy Refill Coordination Note  Kristine Brown is a 32 y.o. female contacted today regarding refills of specialty medication(s) Dolutegravir Sodium (Tivicay); Emtricitabine-Tenofovir AF (Descovy)   Patient requested Delivery   Delivery date: 05/31/23   Verified address: [address]   Medication will be filled on 05/28/23.

## 2023-05-27 ENCOUNTER — Telehealth: Payer: Self-pay | Admitting: Family

## 2023-05-27 ENCOUNTER — Other Ambulatory Visit: Payer: Self-pay

## 2023-05-27 ENCOUNTER — Other Ambulatory Visit (HOSPITAL_COMMUNITY): Payer: Self-pay

## 2023-05-27 MED ORDER — TIVICAY 50 MG PO TABS
50.0000 mg | ORAL_TABLET | Freq: Every day | ORAL | 0 refills | Status: DC
  Filled 2023-05-27 – 2023-06-16 (×2): qty 30, 30d supply, fill #0

## 2023-05-27 MED ORDER — DESCOVY 200-25 MG PO TABS
1.0000 | ORAL_TABLET | Freq: Every day | ORAL | 0 refills | Status: DC
  Filled 2023-05-27 – 2023-06-16 (×2): qty 30, 30d supply, fill #0

## 2023-05-27 NOTE — Telephone Encounter (Signed)
[facility] called back to schedule an appointment with Tammy Sours. She is scheduled 1/27 but requested a refill to [facility].

## 2023-05-27 NOTE — Progress Notes (Addendum)
05/27/23: Descovy & Tivicay  Patient will pick up medication 12/26 at [facility] per [facility] clinic. Left patient a voicemail to confirm on 12/27.

## 2023-05-28 ENCOUNTER — Other Ambulatory Visit: Payer: Self-pay

## 2023-06-09 ENCOUNTER — Emergency Department (HOSPITAL_COMMUNITY)
Admission: EM | Admit: 2023-06-09 | Discharge: 2023-06-09 | Discharge status: Against Medical Advice | Payer: Medicaid Other | Attending: Emergency Medicine | Admitting: Emergency Medicine

## 2023-06-09 ENCOUNTER — Encounter (HOSPITAL_COMMUNITY): Payer: Self-pay | Admitting: Emergency Medicine

## 2023-06-09 DIAGNOSIS — R112 Nausea with vomiting, unspecified: Secondary | ICD-10-CM | POA: Insufficient documentation

## 2023-06-09 DIAGNOSIS — R1084 Generalized abdominal pain: Secondary | ICD-10-CM | POA: Diagnosis not present

## 2023-06-09 DIAGNOSIS — Z5321 Procedure and treatment not carried out due to patient leaving prior to being seen by health care provider: Secondary | ICD-10-CM | POA: Insufficient documentation

## 2023-06-09 LAB — URINALYSIS, ROUTINE W REFLEX MICROSCOPIC
Bilirubin Urine: NEGATIVE
Glucose, UA: NEGATIVE mg/dL
Ketones, ur: 20 mg/dL — AB
Leukocytes,Ua: NEGATIVE
Nitrite: NEGATIVE
Protein, ur: 100 mg/dL — AB
RBC / HPF: >50 RBC/hpf (ref 0–5)
Specific Gravity, Urine: 1.02 (ref 1.005–1.030)
pH: 8 (ref 5.0–8.0)

## 2023-06-09 LAB — COMPREHENSIVE METABOLIC PANEL
ALT: 20 U/L (ref 0–44)
AST: 31 U/L (ref 15–41)
Albumin: 4.3 g/dL (ref 3.5–5.0)
Alkaline Phosphatase: 49 U/L (ref 38–126)
Anion gap: 12 (ref 5–15)
BUN: 8 mg/dL (ref 6–20)
CO2: 21 mmol/L — ABNORMAL LOW (ref 22–32)
Calcium: 9.5 mg/dL (ref 8.9–10.3)
Chloride: 105 mmol/L (ref 98–111)
Creatinine, Ser: 0.78 mg/dL (ref 0.44–1.00)
GFR, Estimated: >60 mL/min (ref >60)
Glucose, Bld: 149 mg/dL — ABNORMAL HIGH (ref 70–99)
Potassium: 3.1 mmol/L — ABNORMAL LOW (ref 3.5–5.1)
Sodium: 138 mmol/L (ref 135–145)
Total Bilirubin: 0.4 mg/dL (ref 0.0–1.2)
Total Protein: 8 g/dL (ref 6.5–8.1)

## 2023-06-09 LAB — CBC
HCT: 36 % (ref 36.0–46.0)
Hemoglobin: 12.3 g/dL (ref 12.0–15.0)
MCH: 31.4 pg (ref 26.0–34.0)
MCHC: 34.2 g/dL (ref 30.0–36.0)
MCV: 91.8 fL (ref 80.0–100.0)
Platelets: 299 10*3/uL (ref 150–400)
RBC: 3.92 MIL/uL (ref 3.87–5.11)
RDW: 14.5 % (ref 11.5–15.5)
WBC: 7.1 10*3/uL (ref 4.0–10.5)
nRBC: 0 % (ref 0.0–0.2)

## 2023-06-09 LAB — HCG, SERUM, QUALITATIVE: Preg, Serum: NEGATIVE

## 2023-06-09 LAB — LIPASE, BLOOD: Lipase: 32 U/L (ref 11–51)

## 2023-06-09 MED ORDER — ONDANSETRON 4 MG PO TBDP
4.0000 mg | ORAL_TABLET | Freq: Once | ORAL | Status: AC | PRN
  Administered 2023-06-09: 4 mg via ORAL
  Filled 2023-06-09: qty 1

## 2023-06-09 MED ORDER — HALOPERIDOL LACTATE 5 MG/ML IJ SOLN
5.0000 mg | Freq: Once | INTRAMUSCULAR | Status: AC
  Administered 2023-06-09: 5 mg via INTRAVENOUS
  Filled 2023-06-09: qty 1

## 2023-06-09 MED ORDER — ONDANSETRON HCL 4 MG/2ML IJ SOLN: 4.0000 mg | Freq: Once | INTRAMUSCULAR | Status: DC

## 2023-06-09 MED ORDER — FENTANYL CITRATE PF 50 MCG/ML IJ SOSY: 50.0000 ug | PREFILLED_SYRINGE | Freq: Once | INTRAMUSCULAR | Status: DC

## 2023-06-09 NOTE — ED Triage Notes (Signed)
 Pt arrives via [facility] with generalized abd pain with n/v for a few days.  Reports recent sick contacts.

## 2023-06-09 NOTE — ED Notes (Signed)
Pt aware urine specimen needed

## 2023-06-09 NOTE — ED Provider Triage Note (Signed)
 Emergency Medicine Provider Triage Evaluation Note  Kristine Brown , a 33 y.o. female  was evaluated in triage.  Pt complains of abd pain, n/v. Bilious emesis. Previous hx of csection. Has been around sick contacts and also admits to marijuana use recently. Doesn't think she is pregnant.  Review of Systems  Positive: Abd pain, nausea, emesis Negative: diarrhea  Physical Exam  BP 126/85 (BP Location: Right Arm)   Pulse 75   Temp 97.7 F (36.5 C) (Oral)   Ht 5' 2 (1.575 m)   Wt 68 kg   LMP 06/09/2023 (Approximate)   SpO2 99%   BMI 27.42 kg/m  Gen:   Awake, distressed due to abd pain   Resp:  Normal effort  MSK:   Moves extremities without difficulty   Medical Decision Making  Medically screening exam initiated at 9:58 AM.  Appropriate orders placed.  Kristine Brown was informed that the remainder of the evaluation will be completed by another provider, this initial triage assessment does not replace that evaluation, and the importance of remaining in the ED until their evaluation is complete.  Labs and haldol  ordered. Suspect cyclic vomiting vs gastroenteritis.     Charlyn Sora, MD 06/09/23 1000

## 2023-06-11 ENCOUNTER — Other Ambulatory Visit (HOSPITAL_COMMUNITY): Payer: Self-pay

## 2023-06-16 ENCOUNTER — Other Ambulatory Visit: Payer: Self-pay

## 2023-06-16 ENCOUNTER — Other Ambulatory Visit (HOSPITAL_COMMUNITY): Payer: Self-pay

## 2023-06-17 ENCOUNTER — Other Ambulatory Visit (HOSPITAL_COMMUNITY): Payer: Self-pay

## 2023-06-28 ENCOUNTER — Ambulatory Visit (INDEPENDENT_AMBULATORY_CARE_PROVIDER_SITE_OTHER): Payer: Medicaid Other | Admitting: Family

## 2023-06-28 ENCOUNTER — Other Ambulatory Visit: Payer: Self-pay

## 2023-06-28 ENCOUNTER — Other Ambulatory Visit (HOSPITAL_COMMUNITY): Payer: Self-pay

## 2023-06-28 ENCOUNTER — Encounter: Payer: Self-pay | Admitting: Family

## 2023-06-28 VITALS — BP 126/78 | HR 87 | Temp 98.4°F | Ht 62.0 in | Wt 142.0 lb

## 2023-06-28 DIAGNOSIS — Z0000 Encounter for general adult medical examination without abnormal findings: Secondary | ICD-10-CM

## 2023-06-28 DIAGNOSIS — N926 Irregular menstruation, unspecified: Secondary | ICD-10-CM | POA: Insufficient documentation

## 2023-06-28 DIAGNOSIS — Z21 Asymptomatic human immunodeficiency virus [HIV] infection status: Secondary | ICD-10-CM

## 2023-06-28 MED ORDER — DESCOVY 200-25 MG PO TABS
1.0000 | ORAL_TABLET | Freq: Every day | ORAL | 0 refills | Status: DC
  Filled 2023-06-28 – 2023-07-16 (×2): qty 30, 30d supply, fill #0

## 2023-06-28 MED ORDER — TIVICAY 50 MG PO TABS
50.0000 mg | ORAL_TABLET | Freq: Every day | ORAL | 0 refills | Status: DC
  Filled 2023-06-28 – 2023-07-16 (×2): qty 30, 30d supply, fill #0

## 2023-06-28 MED ORDER — ONDANSETRON 4 MG PO TBDP
4.0000 mg | ORAL_TABLET | Freq: Three times a day (TID) | ORAL | 1 refills | Status: DC | PRN
  Filled 2023-06-28: qty 20, 7d supply, fill #0

## 2023-06-28 NOTE — Assessment & Plan Note (Signed)
River continues to have well-controlled virus with good adherence and tolerance to Tivicay and Descovy.  Reviewed previous lab work and discussed plan of care and U equals U.  Following discussion weighing the risks and benefits of changing from Tivicay/Descovy to [facility] she decided to stay on Tivicay/Descovy for now.  Check blood work.  Plan for follow-up in 4 months or sooner if needed with lab work on the same day.

## 2023-06-28 NOTE — Patient Instructions (Addendum)
Nice to see you.  We will check your lab work today.  Continue to take your medication daily as prescribed.  Refills have been sent to the pharmacy.  Plan for follow up in 4 months or sooner if needed with lab work on the same day.  Have a great day and stay safe!

## 2023-06-28 NOTE — Assessment & Plan Note (Signed)
Discussed importance of safe sexual practice and condom use. Condoms and site specific STD testing offered.  Vaccinations reviewed and declined. Pap smear up-to-date and followed by gynecology.

## 2023-06-28 NOTE — Assessment & Plan Note (Signed)
Kristine Brown has been having ongoing irregular menstrual cycles of approximately every 2 weeks with severe cramping that is refractory to ibuprofen.  Recommend follow-up with gynecology for further evaluation and treatment as appropriate.

## 2023-06-28 NOTE — Progress Notes (Signed)
Brief Narrative   Patient ID: Kristine Brown, female    DOB: 1990/06/02, 33 y.o.   MRN: [id number]  Kristine Brown is a 33 y/o AA female diagnosed with HIV in May of 2019 with risk factor of heterosexual contact. Initial CD4 count of 769 and viral load of 16,400. Genotype with no significant resistance patterns. Entered care at  [facility] Stage 1. [id number] negative. Sole medication regimen of Biktarvy.    Subjective:    Chief Complaint  Patient presents with   Follow-up    B20    HPI:  Kristine Brown is a 33 y.o. female with HIV disease last seen on 12/17/2022 with well-controlled virus and good adherence and tolerance to Tivicay and Descovy.  Viral load was undetectable with CD4 count 1059.  Kidney function, liver function, electrolytes within normal ranges.  Here today for routine follow-up.  Kristine Brown has been doing well since her last office visit and continues to take Tivicay and Descovy as prescribed with no adverse side effects or problems obtaining medication from the pharmacy.  Has questions about possibly switching back to Kristine Brown.  Has also been experiencing irregular menstrual cycles along with cramping that has been refractory to ibuprofen with an occurrence of approximately every 2 weeks.  Condoms and site-specific STD testing offered.  Vaccinations reviewed and declined.   Denies fevers, chills, night sweats, headaches, changes in vision, neck pain/stiffness, nausea, diarrhea, vomiting, lesions or rashes.  Lab Results  Component Value Date   CD4TCELL 41 02/15/2023   CD4TABS 1,059 02/15/2023   Lab Results  Component Value Date   HIV1RNAQUANT <20 (H) 02/15/2023     No Known Allergies    Outpatient Medications Prior to Visit  Medication Sig Dispense Refill   albuterol (VENTOLIN HFA) 108 (90 Base) MCG/ACT inhaler Inhale into the lungs.     dolutegravir (TIVICAY) 50 MG tablet Take 1 tablet (50 mg total) by mouth daily. 30 tablet 0   emtricitabine-tenofovir AF (DESCOVY)  200-25 MG tablet Take 1 tablet by mouth daily. 30 tablet 0   ferrous sulfate 325 (65 FE) MG tablet Take 1 tablet (325 mg total) by mouth every other day. (Patient not taking: Reported on 06/28/2023) 30 tablet 3   ibuprofen (ADVIL) 600 MG tablet Take 1 tablet (600 mg total) by mouth every 6 (six) hours. (Patient not taking: Reported on 06/28/2023) 30 tablet 0   No facility-administered medications prior to visit.     Past Medical History:  Diagnosis Date   Anxiety    Asthma    Depression    HIV (human immunodeficiency virus infection) (HCC)      Past Surgical History:  Procedure Laterality Date   CESAREAN SECTION N/A 08/04/2022   Procedure: CESAREAN SECTION;  Surgeon: Levie Heritage, DO;  Location: [facility] LD [facility]S;  Service: Obstetrics;  Laterality: N/A;   NO PAST SURGERIES        Review of Systems  Constitutional:  Negative for appetite change, chills, diaphoresis, fatigue, fever and unexpected weight change.  Eyes:        Negative for acute change in vision  Respiratory:  Negative for chest tightness, shortness of breath and wheezing.   Cardiovascular:  Negative for chest pain.  Gastrointestinal:  Negative for diarrhea, nausea and vomiting.  Genitourinary:  Positive for menstrual problem. Negative for dysuria, pelvic pain and vaginal discharge.  Musculoskeletal:  Negative for neck pain and neck stiffness.  Skin:  Negative for rash.  Neurological:  Negative for seizures, syncope, weakness and headaches.  Hematological:  Negative for adenopathy. Does not bruise/bleed easily.  Psychiatric/Behavioral:  Negative for hallucinations.       Objective:    BP 126/78   Pulse 87   Temp 98.4 F (36.9 C) (Temporal)   Ht 5\' 2"  (1.575 m)   Wt 142 lb (64.4 kg)   LMP 06/09/2023 (Approximate)   SpO2 98%   BMI 25.97 kg/m  Nursing note and vital signs reviewed.  Physical Exam Constitutional:      General: She is not in acute distress.    Appearance: She is well-developed.  Eyes:      Conjunctiva/sclera: Conjunctivae normal.  Cardiovascular:     Rate and Rhythm: Normal rate and regular rhythm.     Heart sounds: Normal heart sounds. No murmur heard.    No friction rub. No gallop.  Pulmonary:     Effort: Pulmonary effort is normal. No respiratory distress.     Breath sounds: Normal breath sounds. No wheezing or rales.  Chest:     Chest wall: No tenderness.  Abdominal:     General: Bowel sounds are normal.     Palpations: Abdomen is soft.     Tenderness: There is no abdominal tenderness.  Musculoskeletal:     Cervical back: Neck supple.  Lymphadenopathy:     Cervical: No cervical adenopathy.  Skin:    General: Skin is warm and dry.     Findings: No rash.  Neurological:     Mental Status: She is alert and oriented to person, place, and time.  Psychiatric:        Behavior: Behavior normal.        Thought Content: Thought content normal.        Judgment: Judgment normal.         06/28/2023    3:08 PM 12/17/2022    9:09 AM 05/14/2022    4:46 PM 09/08/2021    3:19 PM 07/22/2021    9:24 AM  Depression screen PHQ 2/9  Decreased Interest 0 0 1 1 0  Down, Depressed, Hopeless 0 0 0 1 0  PHQ - 2 Score 0 0 1 2 0  Altered sleeping   0 1   Tired, decreased energy   1 1   Change in appetite   0 1   Feeling bad or failure about yourself    0 0   Trouble concentrating   0 1   Moving slowly or fidgety/restless   1 0   Suicidal thoughts   0 0   PHQ-9 Score   3 6        Assessment & Plan:    Patient Active Problem List   Diagnosis Date Noted   Irregular menses 06/28/2023   MRSA cellulitis 08/16/2022   Infected incision 08/10/2022   Sepsis after cesarean section 08/10/2022   Placental abruption, delivered 08/04/2022   Delivery by classical cesarean section 08/04/2022   Alpha thalassemia silent carrier 06/02/2022   Supervision of high risk pregnancy, antepartum 04/29/2022   Healthcare maintenance 07/22/2021   Depression 11/17/2019   Anxiety 05/12/2018    Asymptomatic HIV infection (HCC) 05/12/2018     Problem List Items Addressed This Visit       Other   Asymptomatic HIV infection (HCC)   Amil continues to have well-controlled virus with good adherence and tolerance to Tivicay and Descovy.  Reviewed previous lab work and discussed plan of care and U equals U.  Following discussion weighing the risks and benefits of changing from Tivicay/Descovy  to [place] she decided to stay on Tivicay/Descovy for now.  Check blood work.  Plan for follow-up in 4 months or sooner if needed with lab work on the same day.      Relevant Medications   dolutegravir (TIVICAY) 50 MG tablet   emtricitabine-tenofovir AF (DESCOVY) 200-25 MG tablet   Other Relevant Orders   COMPLETE METABOLIC PANEL WITH GFR   HIV-1 RNA quant-no reflex-bld   T-helper cell (CD4)- ([facility] clinic only)   CBC   Healthcare maintenance - Primary   Discussed importance of safe sexual practice and condom use. Condoms and site specific STD testing offered.  Vaccinations reviewed and declined. Pap smear up-to-date and followed by gynecology.      Irregular menses   Kristine Brown has been having ongoing irregular menstrual cycles of approximately every 2 weeks with severe cramping that is refractory to ibuprofen.  Recommend follow-up with gynecology for further evaluation and treatment as appropriate.        I am having Renato Battles start on ondansetron. I am also having her maintain her albuterol, ibuprofen, ferrous sulfate, Tivicay, and Descovy.   Meds ordered this encounter  Medications   ondansetron (ZOFRAN-ODT) 4 MG disintegrating tablet    Sig: Dissolve 1 tablet in mouth every 8 (eight) hours as needed for nausea or vomiting.    Dispense:  20 tablet    Refill:  1    Please mail    Supervising Provider:   Drue Second, CYNTHIA [[id number]]   dolutegravir (TIVICAY) 50 MG tablet    Sig: Take 1 tablet (50 mg total) by mouth daily.    Dispense:  30 tablet    Refill:  0    Please mail     Supervising Provider:   Judyann Munson [phone]    Prescription Type::   Renewal   emtricitabine-tenofovir AF (DESCOVY) 200-25 MG tablet    Sig: Take 1 tablet by mouth daily.    Dispense:  30 tablet    Refill:  0    Please mail    Supervising Provider:   Judyann Munson [phone]    Prescription Type::   Renewal     Follow-up: Return in about 6 months (around 12/26/2023). or sooner if needed.    Marcos Eke, MSN, FNP-C Nurse Practitioner [facility] Main number: [phone]

## 2023-06-29 ENCOUNTER — Other Ambulatory Visit: Payer: Self-pay

## 2023-06-29 LAB — T-HELPER CELL (CD4) - (RCID CLINIC ONLY)
CD4 % Helper T Cell: 44 % (ref 33–65)
CD4 T Cell Abs: 1066 /uL (ref 400–1790)

## 2023-07-01 LAB — COMPLETE METABOLIC PANEL WITH GFR
AG Ratio: 1.6 (calc) (ref 1.0–2.5)
ALT: 12 U/L (ref 6–29)
AST: 14 U/L (ref 10–30)
Albumin: 4.6 g/dL (ref 3.6–5.1)
Alkaline phosphatase (APISO): 49 U/L (ref 31–125)
BUN: 7 mg/dL (ref 7–25)
CO2: 26 mmol/L (ref 20–32)
Calcium: 9.9 mg/dL (ref 8.6–10.2)
Chloride: 103 mmol/L (ref 98–110)
Creat: 0.87 mg/dL (ref 0.50–0.97)
Globulin: 2.9 g/dL (ref 1.9–3.7)
Glucose, Bld: 97 mg/dL (ref 65–99)
Potassium: 4 mmol/L (ref 3.5–5.3)
Sodium: 138 mmol/L (ref 135–146)
Total Bilirubin: 0.3 mg/dL (ref 0.2–1.2)
Total Protein: 7.5 g/dL (ref 6.1–8.1)
eGFR: 90 mL/min/{1.73_m2} (ref >=60)

## 2023-07-01 LAB — CBC
HCT: 35.9 % (ref 35.0–45.0)
Hemoglobin: 11.8 g/dL (ref 11.7–15.5)
MCH: 30.3 pg (ref 27.0–33.0)
MCHC: 32.9 g/dL (ref 32.0–36.0)
MCV: 92.3 fL (ref 80.0–100.0)
MPV: 9.5 fL (ref 7.5–12.5)
Platelets: 319 10*3/uL (ref 140–400)
RBC: 3.89 10*6/uL (ref 3.80–5.10)
RDW: 13.3 % (ref 11.0–15.0)
WBC: 6 10*3/uL (ref 3.8–10.8)

## 2023-07-01 LAB — HIV-1 RNA QUANT-NO REFLEX-BLD
HIV 1 RNA Quant: NOT DETECTED {copies}/mL
HIV-1 RNA Quant, Log: NOT DETECTED {Log}

## 2023-07-07 ENCOUNTER — Other Ambulatory Visit: Payer: Self-pay

## 2023-07-09 ENCOUNTER — Other Ambulatory Visit (HOSPITAL_COMMUNITY): Payer: Self-pay

## 2023-07-16 ENCOUNTER — Other Ambulatory Visit: Payer: Self-pay

## 2023-07-16 NOTE — Progress Notes (Signed)
Specialty Pharmacy Refill Coordination Note  Laresa Oshiro is a 33 y.o. female contacted today regarding refills of specialty medication(s) Dolutegravir Sodium (Tivicay); Emtricitabine-Tenofovir AF (Descovy)   Patient requested Delivery   Delivery date: 07/20/23   Verified address: [address]   [place] [address]   Medication will be filled on 07/19/23.

## 2023-07-19 ENCOUNTER — Other Ambulatory Visit: Payer: Self-pay

## 2023-07-26 ENCOUNTER — Encounter (HOSPITAL_COMMUNITY): Payer: Self-pay | Admitting: *Deleted

## 2023-07-26 ENCOUNTER — Emergency Department (HOSPITAL_COMMUNITY)
Admission: EM | Admit: 2023-07-26 | Discharge: 2023-07-26 | Disposition: A | Discharge status: Home / Self Care | Payer: Medicaid Other | Attending: Emergency Medicine | Admitting: Emergency Medicine

## 2023-07-26 ENCOUNTER — Telehealth: Payer: Self-pay

## 2023-07-26 ENCOUNTER — Other Ambulatory Visit: Payer: Self-pay

## 2023-07-26 ENCOUNTER — Encounter (HOSPITAL_COMMUNITY): Payer: Self-pay

## 2023-07-26 ENCOUNTER — Ambulatory Visit (HOSPITAL_COMMUNITY)
Admission: EM | Admit: 2023-07-26 | Discharge: 2023-07-26 | Disposition: A | Discharge status: Home / Self Care | Payer: Medicaid Other | Attending: Family Medicine | Admitting: Family Medicine

## 2023-07-26 DIAGNOSIS — R109 Unspecified abdominal pain: Secondary | ICD-10-CM | POA: Insufficient documentation

## 2023-07-26 DIAGNOSIS — R112 Nausea with vomiting, unspecified: Secondary | ICD-10-CM | POA: Insufficient documentation

## 2023-07-26 DIAGNOSIS — R197 Diarrhea, unspecified: Secondary | ICD-10-CM | POA: Insufficient documentation

## 2023-07-26 DIAGNOSIS — R1084 Generalized abdominal pain: Secondary | ICD-10-CM | POA: Diagnosis not present

## 2023-07-26 LAB — URINALYSIS, ROUTINE W REFLEX MICROSCOPIC
Bilirubin Urine: NEGATIVE
Glucose, UA: NEGATIVE mg/dL
Hgb urine dipstick: NEGATIVE
Ketones, ur: 5 mg/dL — AB
Leukocytes,Ua: NEGATIVE
Nitrite: NEGATIVE
Protein, ur: 100 mg/dL — AB
Specific Gravity, Urine: 1.03 (ref 1.005–1.030)
pH: 5 (ref 5.0–8.0)

## 2023-07-26 LAB — SARS CORONAVIRUS 2 BY RT PCR: SARS Coronavirus 2 by RT PCR: NEGATIVE

## 2023-07-26 LAB — COMPREHENSIVE METABOLIC PANEL
ALT: 39 U/L (ref 0–44)
AST: 38 U/L (ref 15–41)
Albumin: 4.1 g/dL (ref 3.5–5.0)
Alkaline Phosphatase: 49 U/L (ref 38–126)
Anion gap: 12 (ref 5–15)
BUN: 8 mg/dL (ref 6–20)
CO2: 22 mmol/L (ref 22–32)
Calcium: 9.9 mg/dL (ref 8.9–10.3)
Chloride: 103 mmol/L (ref 98–111)
Creatinine, Ser: 0.95 mg/dL (ref 0.44–1.00)
GFR, Estimated: >60 mL/min (ref >60)
Glucose, Bld: 120 mg/dL — ABNORMAL HIGH (ref 70–99)
Potassium: 3.5 mmol/L (ref 3.5–5.1)
Sodium: 137 mmol/L (ref 135–145)
Total Bilirubin: 0.6 mg/dL (ref 0.0–1.2)
Total Protein: 7.9 g/dL (ref 6.5–8.1)

## 2023-07-26 LAB — CBC
HCT: 36.6 % (ref 36.0–46.0)
Hemoglobin: 12.4 g/dL (ref 12.0–15.0)
MCH: 31 pg (ref 26.0–34.0)
MCHC: 33.9 g/dL (ref 30.0–36.0)
MCV: 91.5 fL (ref 80.0–100.0)
Platelets: 328 10*3/uL (ref 150–400)
RBC: 4 MIL/uL (ref 3.87–5.11)
RDW: 14.8 % (ref 11.5–15.5)
WBC: 8.2 10*3/uL (ref 4.0–10.5)
nRBC: 0 % (ref 0.0–0.2)

## 2023-07-26 LAB — LIPASE, BLOOD: Lipase: 36 U/L (ref 11–51)

## 2023-07-26 LAB — HCG, SERUM, QUALITATIVE: Preg, Serum: NEGATIVE

## 2023-07-26 LAB — RAPID URINE DRUG SCREEN, HOSP PERFORMED
Amphetamines: NOT DETECTED
Barbiturates: NOT DETECTED
Benzodiazepines: NOT DETECTED
Cocaine: NOT DETECTED
Opiates: NOT DETECTED
Tetrahydrocannabinol: POSITIVE — AB

## 2023-07-26 MED ORDER — SODIUM CHLORIDE 0.9 % IV BOLUS
1000.0000 mL | Freq: Once | INTRAVENOUS | Status: AC
  Administered 2023-07-26: 1000 mL via INTRAVENOUS

## 2023-07-26 MED ORDER — MORPHINE SULFATE (PF) 2 MG/ML IV SOLN: 6.0000 mg | Freq: Once | INTRAVENOUS | Status: DC

## 2023-07-26 MED ORDER — SODIUM CHLORIDE 0.9 % IV SOLN
INTRAVENOUS | Status: DC
Start: 2023-07-26 — End: 2023-07-26

## 2023-07-26 MED ORDER — ONDANSETRON 4 MG PO TBDP
8.0000 mg | ORAL_TABLET | Freq: Once | ORAL | Status: AC
  Administered 2023-07-26: 8 mg via ORAL
  Filled 2023-07-26: qty 2

## 2023-07-26 NOTE — ED Triage Notes (Signed)
 PT advised to go to ED for further eval.

## 2023-07-26 NOTE — ED Triage Notes (Addendum)
 Pt c/o generalized abd pain, N/V/D and throat pain from vomiting started yesterday. Pt actively vomiting in triage.

## 2023-07-26 NOTE — Discharge Instructions (Addendum)
 Patient will go directly to the emergency room for higher level of evaluation and treatment that we can provide here in urgent care setting.

## 2023-07-26 NOTE — ED Provider Notes (Signed)
 [facility] AT [facility] Provider Note   CSN: [id number] Arrival date & time: 07/26/23  1023     History  Chief Complaint  Patient presents with   Abdominal Pain   Emesis   Diarrhea   HPI Kristine Brown is a 33 y.o. female with h/o HIV presenting for generalized abdominal pain nausea vomiting diarrhea.  Also mentions some throat pain that was worse after vomiting.  States her symptoms started Saturday after she had a salad earlier that day.  States she went to the [facility] ED [place] on Saturday and she stated that the workup was reassuring.  She states to give her some pain medicine and antinausea meds she felt better and went home.  She returned to the urgent care yesterday for similar complaint.  States they gave her some Zofran that she felt better again.  Her symptoms have been persistent today.  She states that the abdominal pain overall is improved significantly since she has been here.  States she last vomited early this morning.  She does report daily use of marijuana.  Denies fever.  States the abdominal pain was generally all about the abdomen.  States the diarrhea subsided yesterday.   Abdominal Pain Associated symptoms: diarrhea and vomiting   Emesis Associated symptoms: abdominal pain and diarrhea   Diarrhea Associated symptoms: abdominal pain and vomiting        Home Medications Prior to Admission medications   Medication Sig Start Date End Date Taking? Authorizing Provider  albuterol (VENTOLIN HFA) 108 (90 Base) MCG/ACT inhaler Inhale into the lungs. 08/20/20   [provider]  dolutegravir (TIVICAY) 50 MG tablet Take 1 tablet (50 mg total) by mouth daily. 06/28/23   Veryl Speak, FNP  emtricitabine-tenofovir AF (DESCOVY) 200-25 MG tablet Take 1 tablet by mouth daily. 06/28/23   Veryl Speak, FNP  ferrous sulfate 325 (65 FE) MG tablet Take 1 tablet (325 mg total) by mouth every other day. Patient not taking:  Reported on 06/28/2023 08/07/22   Warden Fillers, MD  ondansetron (ZOFRAN-ODT) 4 MG disintegrating tablet Dissolve 1 tablet in mouth every 8 (eight) hours as needed for nausea or vomiting. 06/28/23   Veryl Speak, FNP      Allergies    Patient has no known allergies.    Review of Systems   Review of Systems  Gastrointestinal:  Positive for abdominal pain, diarrhea and vomiting.    Physical Exam Updated Vital Signs BP 139/80 (BP Location: Right Arm)   Pulse (!) 58   Temp 98.9 F (37.2 C)   Resp 18   Ht 5\' 2"  (1.575 m)   Wt 64.4 kg   SpO2 100%   BMI 25.97 kg/m  Physical Exam Vitals and nursing note reviewed.  HENT:     Head: Normocephalic and atraumatic.     Mouth/Throat:     Mouth: Mucous membranes are moist.  Eyes:     General:        Right eye: No discharge.        Left eye: No discharge.     Conjunctiva/sclera: Conjunctivae normal.  Cardiovascular:     Rate and Rhythm: Normal rate and regular rhythm.     Pulses: Normal pulses.     Heart sounds: Normal heart sounds.  Pulmonary:     Effort: Pulmonary effort is normal.     Breath sounds: Normal breath sounds.  Abdominal:     General: Abdomen is flat. There is no distension.  Palpations: Abdomen is soft.     Tenderness: There is no abdominal tenderness.  Skin:    General: Skin is warm and dry.  Neurological:     General: No focal deficit present.  Psychiatric:        Mood and Affect: Mood normal.     ED Results / Procedures / Treatments   Labs (all labs ordered are listed, but only abnormal results are displayed) Labs Reviewed  COMPREHENSIVE METABOLIC PANEL - Abnormal; Notable for the following components:      Result Value   Glucose, Bld 120 (*)    All other components within normal limits  URINALYSIS, ROUTINE W REFLEX MICROSCOPIC - Abnormal; Notable for the following components:   Color, Urine AMBER (*)    APPearance CLOUDY (*)    Ketones, ur 5 (*)    Protein, ur 100 (*)    Bacteria, UA FEW  (*)    All other components within normal limits  RAPID URINE DRUG SCREEN, HOSP PERFORMED - Abnormal; Notable for the following components:   Tetrahydrocannabinol POSITIVE (*)    All other components within normal limits  SARS CORONAVIRUS 2 BY RT PCR  LIPASE, BLOOD  CBC  HCG, SERUM, QUALITATIVE    EKG None  Radiology No results found.  Procedures Procedures    Medications Ordered in ED Medications  0.9 %  sodium chloride infusion (has no administration in time range)  morphine (PF) 2 MG/ML injection 6 mg (has no administration in time range)  sodium chloride 0.9 % bolus 1,000 mL (1,000 mLs Intravenous New Bag/Given 07/26/23 1105)  ondansetron (ZOFRAN-ODT) disintegrating tablet 8 mg (8 mg Oral Given 07/26/23 1105)    ED Course/ Medical Decision Making/ A&P                                 Medical Decision Making Amount and/or Complexity of Data Reviewed Labs: ordered.  Risk Prescription drug management.   33 year old well-appearing female presenting for abdominal pain nausea vomiting diarrhea.  Exam was unremarkable.  Her abdomen was soft and nontender.  Labs were largely reassuring.  Urine drug revealed that she is positive for THC.  Urinalysis not convincing for UTI.  All other labs stable.  Negative preg. Fluid challenge without complication.  Suspect this is either viral gastroenteritis or CHS.  Patient overall looks very well, no acute distress and hemodynamically stable.  Advised supportive treatment at home and offered to send Zofran to the pharmacy but states she has plenty at home.  Advised to follow-up with PCP.  Discussed return precautions.  Discharged in good condition.        Final Clinical Impression(s) / ED Diagnoses Final diagnoses:  Nausea vomiting and diarrhea  Abdominal pain, unspecified abdominal location    Rx / DC Orders ED Discharge Orders     None         Symiah, [place], PA-C 07/26/23 1422    Margarita Grizzle, MD 08/06/23  1558

## 2023-07-26 NOTE — ED Triage Notes (Signed)
 PT reports ABD pain and vomiting since SAt after she ate a salad at work. Pt went to [facility] in [place] Sunday and was treated and DC. Pt reports she has used Zofran for nausea with out relief. Pt reports she is still vomiting and has seen blood in emesis.

## 2023-07-26 NOTE — ED Provider Notes (Signed)
 [facility]    CSN: [id number] Arrival date & time: 07/26/23  7829      History   Chief Complaint Chief Complaint  Patient presents with   Abdominal Pain   Emesis    HPI Kristine Brown is a 33 y.o. female.    Abdominal Pain Associated symptoms: vomiting   Emesis Associated symptoms: abdominal pain    Here for severe abdominal pain and continued nausea and vomiting.  Symptoms began on February 22.  She was seen at [facility] in [place] yesterday.  Initially the staff she said that the Zofran that was prescribed was not helping.  To me she states that it does help the nausea get better but she is just concerned that it is still happening and she also has a lot of abdominal pain.  No fever or chills  Past medical history significant for HIV for which she takes Tivicay and Descovy.  Also on the earlier telephone encounter with her infectious disease staff, she admitted to some suicidal thoughts.  First she had been instructed to come to the urgent care for the abdominal pain that was not relieved by prior treatments and then she was told to go to the emergency room for the suicidal thoughts.  Patient states that she has not talked to her friend and her suicidal thoughts are better but she is here in our urgent care for [place] to make her better.  She begs for something for pain. Past Medical History:  Diagnosis Date   Anxiety    Asthma    Depression    HIV (human immunodeficiency virus infection) (HCC)     Patient Active Problem List   Diagnosis Date Noted   Irregular menses 06/28/2023   MRSA cellulitis 08/16/2022   Infected incision 08/10/2022   Sepsis after cesarean section 08/10/2022   Placental abruption, delivered 08/04/2022   Delivery by classical cesarean section 08/04/2022   Alpha thalassemia silent carrier 06/02/2022   Supervision of high risk pregnancy, antepartum 04/29/2022   Healthcare maintenance 07/22/2021   Depression 11/17/2019   Anxiety  05/12/2018   Asymptomatic HIV infection (HCC) 05/12/2018    Past Surgical History:  Procedure Laterality Date   CESAREAN SECTION N/A 08/04/2022   Procedure: CESAREAN SECTION;  Surgeon: Levie Heritage, DO;  Location: [facility];  Service: Obstetrics;  Laterality: N/A;   NO PAST SURGERIES      OB History     Gravida  10   Para  3   Term  2   Preterm  1   AB  7   Living  3      SAB  6   IAB  1   Ectopic      Multiple  0   Live Births  3            Home Medications    Prior to Admission medications   Medication Sig Start Date End Date Taking? Authorizing Provider  albuterol (VENTOLIN HFA) 108 (90 Base) MCG/ACT inhaler Inhale into the lungs. 08/20/20  Yes [provider]  dolutegravir (TIVICAY) 50 MG tablet Take 1 tablet (50 mg total) by mouth daily. 06/28/23  Yes Veryl Speak, FNP  emtricitabine-tenofovir AF (DESCOVY) 200-25 MG tablet Take 1 tablet by mouth daily. 06/28/23  Yes Veryl Speak, FNP  ondansetron (ZOFRAN-ODT) 4 MG disintegrating tablet Dissolve 1 tablet in mouth every 8 (eight) hours as needed for nausea or vomiting. 06/28/23  Yes Veryl Speak, FNP  ferrous  sulfate 325 (65 FE) MG tablet Take 1 tablet (325 mg total) by mouth every other day. Patient not taking: Reported on 06/28/2023 08/07/22   Warden Fillers, MD    Family History Family History  Problem Relation Age of Onset   Healthy Mother    Cancer Father    COPD Father    Healthy Daughter    Healthy Son    Asthma Neg Hx    Diabetes Neg Hx    Hearing loss Neg Hx    Hyperlipidemia Neg Hx    Stroke Neg Hx     Social History Social History   Tobacco Use   Smoking status: Former    Types: Cigarettes   Smokeless tobacco: Never  Vaping Use   Vaping status: Never Used  Substance Use Topics   Alcohol use: Yes    Comment: socially   Drug use: Yes    Types: Marijuana     Allergies   Patient has no known allergies.   Review of Systems Review of Systems   Gastrointestinal:  Positive for abdominal pain and vomiting.     Physical Exam Triage Vital Signs ED Triage Vitals  Encounter Vitals Group     BP 07/26/23 1008 129/84     Systolic BP Percentile --      Diastolic BP Percentile --      Pulse Rate 07/26/23 1008 67     Resp 07/26/23 1008 20     Temp 07/26/23 1008 98.5 F (36.9 C)     Temp src --      SpO2 07/26/23 1008 96 %     Weight --      Height --      Head Circumference --      Peak Flow --      Pain Score 07/26/23 1006 10     Pain Loc --      Pain Education --      Exclude from Growth Chart --    No data found.  Updated Vital Signs BP 129/84   Pulse 67   Temp 98.5 F (36.9 C)   Resp 20   SpO2 96%   Visual Acuity Right Eye Distance:   Left Eye Distance:   Bilateral Distance:    Right Eye Near:   Left Eye Near:    Bilateral Near:     Physical Exam Vitals reviewed.  Constitutional:      Appearance: She is not toxic-appearing.     Comments: VS nl here, with normal HR and BP  HENT:     Mouth/Throat:     Mouth: Mucous membranes are moist.  Eyes:     Extraocular Movements: Extraocular movements intact.     Pupils: Pupils are equal, round, and reactive to light.  Cardiovascular:     Rate and Rhythm: Normal rate and regular rhythm.     Heart sounds: No murmur heard. Pulmonary:     Effort: Pulmonary effort is normal.     Breath sounds: Normal breath sounds.  Abdominal:     Palpations: Abdomen is soft.     Tenderness: There is abdominal tenderness.  Skin:    Coloration: Skin is not jaundiced or pale.  Neurological:     General: No focal deficit present.      UC Treatments / Results  Labs (all labs ordered are listed, but only abnormal results are displayed) Labs Reviewed - No data to display  EKG   Radiology No results found.  Procedures Procedures (including  critical care time)  Medications Ordered in UC Medications - No data to display  Initial Impression / Assessment and Plan / UC  Course  I have reviewed the triage vital signs and the nursing notes.  Pertinent labs & imaging results that were available during my care of the patient were reviewed by me and considered in my medical decision making (see chart for details).     The only injectable we have for pain here is Toradol and I cannot give her that with her taking the anti-HIV medications.  I think she needs to go to the emergency room for urgent evaluation, possibly including advanced imaging again and or possibly urgent lab evaluation.  We cannot adequately relieve her pain here in this clinic.  First she asked to go by EMS, but her friend is agreeable to take her across the parking lot to [facility].  I think with her normal vital signs going by private vehicle is reasonable. Final Clinical Impressions(s) / UC Diagnoses   Final diagnoses:  Generalized abdominal pain  Nausea and vomiting, unspecified vomiting type     Discharge Instructions      Patient will go directly to the emergency room for higher level of evaluation and treatment that we can provide here in urgent care setting.    ED Prescriptions   None    PDMP not reviewed this encounter.   Zenia Resides, MD 07/26/23 [phone]

## 2023-07-26 NOTE — Telephone Encounter (Signed)
 Patient called, reports she ate a salad on Saturday for lunch and began feeling poorly on Sunday. Says she went to the ER on Sunday and they gave her some medication for nausea.   She reports stomach pain, vomiting, hot flashes and chills. States her vitals are normal and she does not have a fever.   She would like to be seen today, discussed that we do not have any availability today. Recommended she follow up with her PCP. She states she does not have one. Encouraged her to go to urgent care for symptom management.   She reports that her anxiety is severe and she is having suicidal thoughts. Says her friend is with her, but that she feels like "jumping out of her car" and that she is having suicidal thoughts because she is in pain.   Advised her to have her friend take her to the emergency department due to SI. She confirms that she will have her friend take her to [facility] now. Patient disconnected the call. Notified Marcos Eke, NP of patient's concerns.   Sandie Ano, RN

## 2023-07-26 NOTE — Discharge Instructions (Addendum)
 Evaluation for your nausea vomiting diarrhea was overall reassuring.  Suspect this is a viral illness or could be related to your marijuana use.  Nevertheless the treatment is the same at home which includes assertive hydration with water and Gatorade and you can also take Zofran as needed for nausea and vomiting.  Please follow-up with your PCP.  If you have worsening abdominal pain, inability to tolerate fluid intake, bloody stool or bloody vomit, develop a fever please return to the emergency department further evaluation.

## 2023-07-26 NOTE — ED Provider Triage Note (Signed)
 Emergency Medicine Provider Triage Evaluation Note  Kristine Brown , a 33 y.o. female  was evaluated in triage.  Pt complains of abd pain/emesis.  Review of Systems  Positive: Abd pain Negative: No vaginal bleeding  Physical Exam  BP 139/80 (BP Location: Right Arm)   Pulse (!) 58   Temp 98.9 F (37.2 C)   Resp 18   Ht 1.575 m (5\' 2" )   Wt 64.4 kg   SpO2 100%   BMI 25.97 kg/m  Gen:   Awake, anxious Resp:  Normal effort  MSK:   Moves extremities without difficulty  Other:    Medical Decision Making  Medically screening exam initiated at 10:44 AM.  Appropriate orders placed.  Litzi Binning was informed that the remainder of the evaluation will be completed by another provider, this initial triage assessment does not replace that evaluation, and the importance of remaining in the ED until their evaluation is complete.     Lorre Nick, MD 07/26/23 1045

## 2023-08-06 ENCOUNTER — Other Ambulatory Visit: Payer: Self-pay | Admitting: Family

## 2023-08-06 ENCOUNTER — Other Ambulatory Visit: Payer: Self-pay

## 2023-08-06 DIAGNOSIS — Z21 Asymptomatic human immunodeficiency virus [HIV] infection status: Secondary | ICD-10-CM

## 2023-08-06 NOTE — Progress Notes (Signed)
 Specialty Pharmacy Refill Coordination Note  Kristine Brown is a 33 y.o. female contacted today regarding refills of specialty medication(s) Dolutegravir Sodium (Tivicay); Emtricitabine-Tenofovir AF (Descovy)   Patient requested Delivery   Delivery date: 08/11/23   Verified address: [address]   [place] [address]   Medication will be filled on 03.11.25.     This fill date is pending response to refill request from provider. Patient is aware and if they have not received fill by intended date they must follow up with pharmacy.

## 2023-08-09 ENCOUNTER — Other Ambulatory Visit: Payer: Self-pay | Admitting: Family

## 2023-08-09 ENCOUNTER — Other Ambulatory Visit: Payer: Self-pay

## 2023-08-09 DIAGNOSIS — Z21 Asymptomatic human immunodeficiency virus [HIV] infection status: Secondary | ICD-10-CM

## 2023-08-09 MED ORDER — DESCOVY 200-25 MG PO TABS
1.0000 | ORAL_TABLET | Freq: Every day | ORAL | 0 refills | Status: DC
  Filled 2023-08-09: qty 30, 30d supply, fill #0

## 2023-08-09 MED ORDER — TIVICAY 50 MG PO TABS
50.0000 mg | ORAL_TABLET | Freq: Every day | ORAL | 0 refills | Status: DC
Start: 2023-08-09 — End: 2023-09-21
  Filled 2023-08-09: qty 30, 30d supply, fill #0

## 2023-08-10 ENCOUNTER — Other Ambulatory Visit: Payer: Self-pay

## 2023-08-19 ENCOUNTER — Ambulatory Visit (INDEPENDENT_AMBULATORY_CARE_PROVIDER_SITE_OTHER): Payer: Medicaid Other | Admitting: Family

## 2023-08-19 ENCOUNTER — Other Ambulatory Visit: Payer: Self-pay

## 2023-08-19 ENCOUNTER — Encounter: Payer: Self-pay | Admitting: Family

## 2023-08-19 VITALS — BP 122/72 | HR 89 | Temp 98.7°F | Ht 62.0 in | Wt 139.0 lb

## 2023-08-19 DIAGNOSIS — K529 Noninfective gastroenteritis and colitis, unspecified: Secondary | ICD-10-CM | POA: Insufficient documentation

## 2023-08-19 DIAGNOSIS — Z21 Asymptomatic human immunodeficiency virus [HIV] infection status: Secondary | ICD-10-CM

## 2023-08-19 NOTE — Assessment & Plan Note (Signed)
 Previously seen in the ED/urgent care for nausea/vomiting and found to have gastroenteritis.  Symptoms resolved with no further treatment needed at this time.

## 2023-08-19 NOTE — Progress Notes (Signed)
 Brief Narrative   Patient ID: Kristine Brown, female    DOB: 04-Jun-1990, 33 y.o.   MRN: [id number]  Ms. Keeven is a 34 y/o AA female diagnosed with HIV in May of 2019 with risk factor of heterosexual contact. Initial CD4 count of 769 and viral load of 16,400. Genotype with no significant resistance patterns. Entered care at  [facility] Stage 1. [id number] negative. Sole medication regimen of Biktarvy.    Subjective:    Chief Complaint  Patient presents with   Follow-up    Gastroenteritis    HPI:  Kristine Brown is a 33 y.o. female with HIV disease last seen on 06/28/2023 with well-controlled virus and good adherence and tolerance to Tivicay and Descovy.  Viral load was undetectable with CD4 count 1066.  Kidney function, liver function, electrolytes within normal ranges.  Following visit she was seen in urgent care/emergency room for nausea/vomiting and diagnosed with gastroenteritis or possible CHS. here today for follow-up.  Palin has been doing well since leaving the ED.  Describes eating a salad on a Saturday evening and having 2 mixed drinks of alcohol off and developing nausea/vomiting and diarrhea the following day which is when she was seen at urgent care/in the ED.  Since that time symptoms have completely resolved with no further issues.  Continues to take Tivicay and Descovy as prescribed with no adverse side effects or problems obtaining medication from the pharmacy.  Denies fevers, chills, night sweats, headaches, changes in vision, neck pain/stiffness, nausea, diarrhea, vomiting, lesions or rashes.  Lab Results  Component Value Date   CD4TCELL 44 06/28/2023   CD4TABS 1,066 06/28/2023   Lab Results  Component Value Date   HIV1RNAQUANT Not Detected 06/28/2023     No Known Allergies    Outpatient Medications Prior to Visit  Medication Sig Dispense Refill   albuterol (VENTOLIN HFA) 108 (90 Base) MCG/ACT inhaler Inhale into the lungs.     dolutegravir (TIVICAY) 50 MG  tablet Take 1 tablet (50 mg total) by mouth daily. 30 tablet 0   emtricitabine-tenofovir AF (DESCOVY) 200-25 MG tablet Take 1 tablet by mouth daily. 30 tablet 0   ondansetron (ZOFRAN-ODT) 4 MG disintegrating tablet Dissolve 1 tablet in mouth every 8 (eight) hours as needed for nausea or vomiting. 20 tablet 1   ferrous sulfate 325 (65 FE) MG tablet Take 1 tablet (325 mg total) by mouth every other day. (Patient not taking: Reported on 08/19/2023) 30 tablet 3   No facility-administered medications prior to visit.     Past Medical History:  Diagnosis Date   Anxiety    Asthma    Depression    HIV (human immunodeficiency virus infection) (HCC)      Past Surgical History:  Procedure Laterality Date   CESAREAN SECTION N/A 08/04/2022   Procedure: CESAREAN SECTION;  Surgeon: Levie Heritage, DO;  Location: [facility] LD [facility]S;  Service: Obstetrics;  Laterality: N/A;   NO PAST SURGERIES        Review of Systems  Constitutional:  Negative for appetite change, chills, diaphoresis, fatigue, fever and unexpected weight change.  Eyes:        Negative for acute change in vision  Respiratory:  Negative for chest tightness, shortness of breath and wheezing.   Cardiovascular:  Negative for chest pain.  Gastrointestinal:  Negative for diarrhea, nausea and vomiting.  Genitourinary:  Negative for dysuria, pelvic pain and vaginal discharge.  Musculoskeletal:  Negative for neck pain and neck stiffness.  Skin:  Negative for  rash.  Neurological:  Negative for seizures, syncope, weakness and headaches.  Hematological:  Negative for adenopathy. Does not bruise/bleed easily.  Psychiatric/Behavioral:  Negative for hallucinations.       Objective:    BP 122/72   Pulse 89   Temp 98.7 F (37.1 C) (Oral)   Ht 5\' 2"  (1.575 m)   Wt 139 lb (63 kg)   SpO2 100%   BMI 25.42 kg/m  Nursing note and vital signs reviewed.  Physical Exam Constitutional:      General: She is not in acute distress.    Appearance: She  is well-developed.  Eyes:     Conjunctiva/sclera: Conjunctivae normal.  Cardiovascular:     Rate and Rhythm: Normal rate and regular rhythm.     Heart sounds: Normal heart sounds. No murmur heard.    No friction rub. No gallop.  Pulmonary:     Effort: Pulmonary effort is normal. No respiratory distress.     Breath sounds: Normal breath sounds. No wheezing or rales.  Chest:     Chest wall: No tenderness.  Abdominal:     General: Bowel sounds are normal.     Palpations: Abdomen is soft.     Tenderness: There is no abdominal tenderness.  Musculoskeletal:     Cervical back: Neck supple.  Lymphadenopathy:     Cervical: No cervical adenopathy.  Skin:    General: Skin is warm and dry.     Findings: No rash.  Neurological:     Mental Status: She is alert and oriented to person, place, and time.  Psychiatric:        Behavior: Behavior normal.        Thought Content: Thought content normal.        Judgment: Judgment normal.         08/19/2023    2:49 PM 06/28/2023    3:08 PM 12/17/2022    9:09 AM 05/14/2022    4:46 PM 09/08/2021    3:19 PM  Depression screen PHQ 2/9  Decreased Interest 1 0 0 1 1  Down, Depressed, Hopeless 0 0 0 0 1  PHQ - 2 Score 1 0 0 1 2  Altered sleeping 1   0 1  Tired, decreased energy 1   1 1   Change in appetite 0   0 1  Feeling bad or failure about yourself  0   0 0  Trouble concentrating 1   0 1  Moving slowly or fidgety/restless 0   1 0  Suicidal thoughts 0   0 0  PHQ-9 Score 4   3 6   Difficult doing work/chores Somewhat difficult           Assessment & Plan:    Patient Active Problem List   Diagnosis Date Noted   Gastroenteritis 08/19/2023   Irregular menses 06/28/2023   MRSA cellulitis 08/16/2022   Infected incision 08/10/2022   Sepsis after cesarean section 08/10/2022   Placental abruption, delivered 08/04/2022   Delivery by classical cesarean section 08/04/2022   Alpha thalassemia silent carrier 06/02/2022   Supervision of high risk  pregnancy, antepartum 04/29/2022   Healthcare maintenance 07/22/2021   Depression 11/17/2019   Anxiety 05/12/2018   Asymptomatic HIV infection (HCC) 05/12/2018     Problem List Items Addressed This Visit       Digestive   Gastroenteritis   Previously seen in the ED/urgent care for nausea/vomiting and found to have gastroenteritis.  Symptoms resolved with no further treatment needed at this  time.        Other   Asymptomatic HIV infection (HCC) - Primary   Ms. Breau continues to have well-controlled virus with good adherence and tolerance to Tivicay and Descovy.  Previous lab work reviewed.  Continue current dose of Tivicay and Descovy.        I am having Renato Battles maintain her albuterol, ferrous sulfate, ondansetron, Descovy, and Tivicay.   Follow-up: 3 months or sooner if needed.    Marcos Eke, MSN, FNP-C Nurse Practitioner [facility] for Infectious Disease [facility] Main number: [phone]

## 2023-08-19 NOTE — Assessment & Plan Note (Signed)
 Kristine Brown continues to have well-controlled virus with good adherence and tolerance to Tivicay and Descovy.  Previous lab work reviewed.  Continue current dose of Tivicay and Descovy.

## 2023-08-19 NOTE — Patient Instructions (Addendum)
 Nice to see you.    Continue to take your medication daily as prescribed.  Refills have been sent to the pharmacy.  Have a great day and stay safe!

## 2023-09-03 ENCOUNTER — Other Ambulatory Visit: Payer: Self-pay | Admitting: Family

## 2023-09-03 ENCOUNTER — Other Ambulatory Visit: Payer: Self-pay

## 2023-09-03 DIAGNOSIS — Z21 Asymptomatic human immunodeficiency virus [HIV] infection status: Secondary | ICD-10-CM

## 2023-09-03 NOTE — Progress Notes (Signed)
 Specialty Pharmacy Refill Coordination Note  Kristine Brown is a 33 y.o. female contacted today regarding refills of specialty medication(s) Dolutegravir Sodium (Tivicay); Emtricitabine-Tenofovir AF (Descovy)   Patient requested Delivery   Delivery date: 09/07/23   Verified address: [address]  [place] [address]   Medication will be filled on 09/06/23.   This fill date is pending response to refill request from provider. Patient is aware and if they have not received fill by intended date they must follow up with pharmacy.

## 2023-09-06 ENCOUNTER — Other Ambulatory Visit: Payer: Self-pay

## 2023-09-06 ENCOUNTER — Other Ambulatory Visit (HOSPITAL_COMMUNITY): Payer: Self-pay

## 2023-09-21 ENCOUNTER — Other Ambulatory Visit (HOSPITAL_COMMUNITY): Payer: Self-pay

## 2023-09-21 ENCOUNTER — Other Ambulatory Visit: Payer: Self-pay | Admitting: Family

## 2023-09-21 ENCOUNTER — Other Ambulatory Visit: Payer: Self-pay

## 2023-09-21 DIAGNOSIS — Z21 Asymptomatic human immunodeficiency virus [HIV] infection status: Secondary | ICD-10-CM

## 2023-09-21 MED ORDER — DESCOVY 200-25 MG PO TABS
1.0000 | ORAL_TABLET | Freq: Every day | ORAL | 2 refills | Status: DC
  Filled 2023-09-21: qty 30, 30d supply, fill #0
  Filled 2023-10-26: qty 30, 30d supply, fill #1
  Filled 2023-11-18: qty 30, 30d supply, fill #2

## 2023-09-21 MED ORDER — TIVICAY 50 MG PO TABS
50.0000 mg | ORAL_TABLET | Freq: Every day | ORAL | 2 refills | Status: DC
Start: 2023-09-21 — End: 2023-12-16
  Filled 2023-09-21: qty 30, 30d supply, fill #0
  Filled 2023-10-26: qty 30, 30d supply, fill #1
  Filled 2023-11-18: qty 30, 30d supply, fill #2

## 2023-09-21 NOTE — Progress Notes (Signed)
 Specialty Pharmacy Ongoing Clinical Assessment Note  Kristine Brown is a 33 y.o. female who is being followed by the specialty pharmacy service for RxSp HIV   Patient's specialty medication(s) reviewed today: Dolutegravir  Sodium (Tivicay ); Emtricitabine -Tenofovir  AF (Descovy )   Missed doses in the last 4 weeks: 0   Patient/Caregiver did not have any additional questions or concerns.   Therapeutic benefit summary: Patient is achieving benefit   Adverse events/side effects summary: No adverse events/side effects   Patient's therapy is appropriate to: Continue    Goals Addressed             This Visit's Progress    Achieve Undetectable HIV Viral Load < 20   On track    Patient is on track. Patient will maintain adherence.  Patient's viral load remains undetectable as of 06/28/23.         Follow up:  6 months  Malachi Screws Specialty Pharmacist

## 2023-09-21 NOTE — Progress Notes (Signed)
 Specialty Pharmacy Refill Coordination Note  Kristine Brown is a 33 y.o. female contacted today regarding refills of specialty medication(s) Dolutegravir  Sodium (Tivicay ); Emtricitabine -Tenofovir  AF (Descovy )   Patient requested Delivery   Delivery date: 09/24/23   Verified address: [address]  [place] [address]   Medication will be filled on 09/23/23. This fill date is pending response to refill request from provider. Patient is aware and if they have not received fill by intended date they must follow up with pharmacy.

## 2023-10-15 ENCOUNTER — Other Ambulatory Visit: Payer: Self-pay

## 2023-10-19 ENCOUNTER — Other Ambulatory Visit (HOSPITAL_COMMUNITY): Payer: Self-pay

## 2023-10-20 ENCOUNTER — Other Ambulatory Visit: Payer: Self-pay

## 2023-10-26 ENCOUNTER — Other Ambulatory Visit: Payer: Self-pay

## 2023-10-26 ENCOUNTER — Other Ambulatory Visit (HOSPITAL_COMMUNITY): Payer: Self-pay

## 2023-10-26 NOTE — Progress Notes (Signed)
 Specialty Pharmacy Refill Coordination Note  Kristine Brown is a 33 y.o. female contacted today regarding refills of specialty medication(s) Dolutegravir  Sodium (Tivicay ); Emtricitabine -Tenofovir  AF (Descovy )   Patient requested Delivery   Delivery date: 10/28/23   Verified address: [address] [place] [address]   Medication will be filled on 10/27/23.

## 2023-10-27 ENCOUNTER — Other Ambulatory Visit: Payer: Self-pay

## 2023-11-18 ENCOUNTER — Other Ambulatory Visit: Payer: Self-pay

## 2023-11-18 NOTE — Progress Notes (Signed)
 Specialty Pharmacy Refill Coordination Note  Kristine Brown is a 33 y.o. female contacted today regarding refills of specialty medication(s) Dolutegravir  Sodium (Tivicay ); Emtricitabine -Tenofovir  AF (Descovy )   Patient requested Delivery   Delivery date: 11/22/23   Verified address: [address] [place] [address]   Medication will be filled on 11/18/23 or 11/19/23.

## 2023-12-16 ENCOUNTER — Other Ambulatory Visit: Payer: Self-pay

## 2023-12-16 ENCOUNTER — Other Ambulatory Visit: Payer: Self-pay | Admitting: Family

## 2023-12-16 DIAGNOSIS — Z21 Asymptomatic human immunodeficiency virus [HIV] infection status: Secondary | ICD-10-CM

## 2023-12-16 MED ORDER — TIVICAY 50 MG PO TABS
50.0000 mg | ORAL_TABLET | Freq: Every day | ORAL | 0 refills | Status: DC
  Filled 2023-12-16: qty 30, 30d supply, fill #0

## 2023-12-16 MED ORDER — DESCOVY 200-25 MG PO TABS
1.0000 | ORAL_TABLET | Freq: Every day | ORAL | 0 refills | Status: DC
  Filled 2023-12-16: qty 30, 30d supply, fill #0

## 2023-12-16 NOTE — Progress Notes (Signed)
 Specialty Pharmacy Refill Coordination Note  Francia Verry is a 33 y.o. female contacted today regarding refills of specialty medication(s) Dolutegravir  Sodium (Tivicay ); Emtricitabine -Tenofovir  AF (Descovy )   Patient requested Delivery   Delivery date: 12/20/23   Verified address: [address] [place] [address]   Medication will be filled on 12/17/23, pending refill approval.   Ship when approved. This fill date is pending response to refill request from provider. Patient is aware and if they have not received fill by intended date they must follow up with pharmacy.

## 2024-01-07 ENCOUNTER — Other Ambulatory Visit: Payer: Self-pay

## 2024-01-07 ENCOUNTER — Other Ambulatory Visit: Payer: Self-pay | Admitting: Family

## 2024-01-07 DIAGNOSIS — Z21 Asymptomatic human immunodeficiency virus [HIV] infection status: Secondary | ICD-10-CM

## 2024-01-10 ENCOUNTER — Other Ambulatory Visit: Payer: Self-pay

## 2024-01-10 MED ORDER — TIVICAY 50 MG PO TABS
50.0000 mg | ORAL_TABLET | Freq: Every day | ORAL | 5 refills | Status: AC
  Filled 2024-01-10: qty 30, 30d supply, fill #0
  Filled 2024-02-17 – 2024-03-02 (×2): qty 30, 30d supply, fill #1
  Filled 2024-03-24 – 2024-03-28 (×2): qty 30, 30d supply, fill #2
  Filled 2024-04-24 – 2024-05-26 (×2): qty 30, 30d supply, fill #3
  Filled 2024-06-16 – 2024-06-23 (×3): qty 30, 30d supply, fill #4

## 2024-01-10 MED ORDER — DESCOVY 200-25 MG PO TABS
1.0000 | ORAL_TABLET | Freq: Every day | ORAL | 5 refills | Status: AC
  Filled 2024-01-10: qty 30, 30d supply, fill #0
  Filled 2024-02-17 – 2024-03-02 (×2): qty 30, 30d supply, fill #1
  Filled 2024-03-24 – 2024-03-28 (×2): qty 30, 30d supply, fill #2
  Filled 2024-04-24 – 2024-05-26 (×2): qty 30, 30d supply, fill #3
  Filled 2024-06-16 – 2024-06-23 (×4): qty 30, 30d supply, fill #4

## 2024-01-10 NOTE — Progress Notes (Signed)
 Specialty Pharmacy Refill Coordination Note  Kristine Brown is a 33 y.o. female contacted today regarding refills of specialty medication(s) Emtricitabine -Tenofovir  AF (Descovy ); Dolutegravir  Sodium (Tivicay )   Patient requested Delivery   Delivery date: 01/14/24   Verified address: [address] [place] [address]   Medication will be filled on 08.14.25.

## 2024-01-12 ENCOUNTER — Other Ambulatory Visit: Payer: Self-pay

## 2024-02-04 ENCOUNTER — Other Ambulatory Visit (HOSPITAL_COMMUNITY): Payer: Self-pay

## 2024-02-04 ENCOUNTER — Ambulatory Visit: Admitting: Family

## 2024-02-04 ENCOUNTER — Encounter: Payer: Self-pay | Admitting: Family

## 2024-02-04 ENCOUNTER — Other Ambulatory Visit: Payer: Self-pay

## 2024-02-04 VITALS — BP 110/72 | HR 84 | Temp 98.4°F | Wt 144.0 lb

## 2024-02-04 DIAGNOSIS — Z21 Asymptomatic human immunodeficiency virus [HIV] infection status: Secondary | ICD-10-CM

## 2024-02-04 DIAGNOSIS — Z0000 Encounter for general adult medical examination without abnormal findings: Secondary | ICD-10-CM

## 2024-02-04 DIAGNOSIS — Z113 Encounter for screening for infections with a predominantly sexual mode of transmission: Secondary | ICD-10-CM

## 2024-02-04 NOTE — Assessment & Plan Note (Signed)
 Kristine Brown continues to have well-controlled virus with good adherence and tolerance to Tivicay /Descovy .  Reviewed previous lab work and discussed plan of care and U equals U.  No problems obtaining medication from the pharmacy and covered by Medicaid.  Social determinants of health reviewed with no interventions indicated.  Continue current dose of Tivicay /Descovy .  Check lab work.  Plan for follow-up in 6 months or sooner if needed with lab work on the same day.

## 2024-02-04 NOTE — Progress Notes (Signed)
 Brief Narrative   Patient ID: Kristine Brown, female    DOB: Oct 06, 1990, 33 y.o.   MRN: [id number]  Kristine Brown is a 33 y/o AA female diagnosed with HIV in May of 2019 with risk factor of heterosexual contact. Initial CD4 count of 769 and viral load of 16,400. Genotype with no significant resistance patterns. Entered care at  [facility] Stage 1. [id number] negative. Sole medication regimen of Biktarvy .    Subjective:   Chief Complaint  Patient presents with   Follow-up    B20    HPI:  Kristine Brown is a 33 y.o. female with HIV disease last seen on 06/28/23 with well-controlled virus and good adherence and tolerance to Tivicay  and Descovy .  Viral load was undetectable with CD4 count 1066.  Kidney function, liver function, electrolytes within normal ranges.  Here today for routine follow-up.  Kristine Brown has been doing well since her last office visit and continues to take Tivicay /Descovy  as prescribed with no adverse side effects or problems obtaining medication from the pharmacy.  Covered by Medicaid.  Requesting pregnancy test.  Recently lost her job and is currently seeking a new job.  Housing, transportation, and access to food are stable.  Routine dental care up-to-date.  Condoms and site-specific STD testing offered.  Healthcare maintenance reviewed.  Denies fevers, chills, night sweats, headaches, changes in vision, neck pain/stiffness, nausea, diarrhea, vomiting, lesions or rashes.  Lab Results  Component Value Date   CD4TCELL 44 06/28/2023   CD4TABS 1,066 06/28/2023   Lab Results  Component Value Date   HIV1RNAQUANT Not Detected 06/28/2023     No Known Allergies    Outpatient Medications Prior to Visit  Medication Sig Dispense Refill   albuterol (VENTOLIN HFA) 108 (90 Base) MCG/ACT inhaler Inhale into the lungs.     dolutegravir  (TIVICAY ) 50 MG tablet Take 1 tablet (50 mg total) by mouth daily. 30 tablet 5   emtricitabine -tenofovir  AF (DESCOVY ) 200-25 MG tablet Take 1  tablet by mouth daily. 30 tablet 5   ferrous sulfate  325 (65 FE) MG tablet Take 1 tablet (325 mg total) by mouth every other day. 30 tablet 3   ondansetron  (ZOFRAN -ODT) 4 MG disintegrating tablet Dissolve 1 tablet in mouth every 8 (eight) hours as needed for nausea or vomiting. 20 tablet 1   No facility-administered medications prior to visit.     Past Medical History:  Diagnosis Date   Anxiety    Asthma    Depression    HIV (human immunodeficiency virus infection) (HCC)      Past Surgical History:  Procedure Laterality Date   CESAREAN SECTION N/A 08/04/2022   Procedure: CESAREAN SECTION;  Surgeon: Barbra Lang PARAS, DO;  Location: [facility] LD ORS;  Service: Obstetrics;  Laterality: N/A;   NO PAST SURGERIES          Review of Systems  Constitutional:  Negative for chills, diaphoresis, fatigue and fever.  Respiratory:  Negative for cough, chest tightness, shortness of breath and wheezing.   Cardiovascular:  Negative for chest pain.  Gastrointestinal:  Negative for abdominal pain, diarrhea, nausea and vomiting.     Objective:   BP 110/72   Pulse 84   Temp 98.4 F (36.9 C) (Oral)   Wt 144 lb (65.3 kg)   LMP 01/19/2024 (Approximate)   SpO2 97%   BMI 26.34 kg/m  Nursing note and vital signs reviewed.  Physical Exam Constitutional:      General: She is not in acute distress.  Appearance: She is well-developed.  Cardiovascular:     Rate and Rhythm: Normal rate and regular rhythm.     Heart sounds: Normal heart sounds.  Pulmonary:     Effort: Pulmonary effort is normal.     Breath sounds: Normal breath sounds.  Skin:    General: Skin is warm and dry.  Neurological:     Mental Status: She is alert and oriented to person, place, and time.  Psychiatric:        Behavior: Behavior normal.        Thought Content: Thought content normal.        Judgment: Judgment normal.          08/19/2023    2:49 PM 06/28/2023    3:08 PM 12/17/2022    9:09 AM 05/14/2022    4:46 PM  09/08/2021    3:19 PM  Depression screen PHQ 2/9  Decreased Interest 1 0 0 1 1  Down, Depressed, Hopeless 0 0 0 0 1  PHQ - 2 Score 1 0 0 1 2  Altered sleeping 1   0 1  Tired, decreased energy 1   1 1   Change in appetite 0   0 1  Feeling bad or failure about yourself  0   0 0  Trouble concentrating 1   0 1  Moving slowly or fidgety/restless 0   1 0  Suicidal thoughts 0   0 0  PHQ-9 Score 4   3 6   Difficult doing work/chores Somewhat difficult            08/19/2023    2:50 PM 05/14/2022    4:47 PM 09/08/2021    3:19 PM  GAD 7 : Generalized Anxiety Score  Nervous, Anxious, on Edge 1 1 1   Control/stop worrying 0 2 1  Worry too much - different things 1 2 1   Trouble relaxing 1 0 1  Restless 0 0 0  Easily annoyed or irritable 2 1 1   Afraid - awful might happen 0 0 1  Total GAD 7 Score 5 6 6   Anxiety Difficulty Somewhat difficult       The ASCVD Risk score (Arnett DK, et al., 2019) failed to calculate for the following reasons:   The 2019 ASCVD risk score is only valid for ages 14 to 40      Assessment & Plan:    Patient Active Problem List   Diagnosis Date Noted   Gastroenteritis 08/19/2023   Irregular menses 06/28/2023   MRSA cellulitis 08/16/2022   Infected incision 08/10/2022   Sepsis after cesarean section 08/10/2022   Placental abruption, delivered 08/04/2022   Delivery by classical cesarean section 08/04/2022   Alpha thalassemia silent carrier 06/02/2022   Supervision of high risk pregnancy, antepartum 04/29/2022   Healthcare maintenance 07/22/2021   Depression 11/17/2019   Anxiety 05/12/2018   Asymptomatic HIV infection (HCC) 05/12/2018     Problem List Items Addressed This Visit       Other   Asymptomatic HIV infection [facility])   [name] continues to have well-controlled virus with good adherence and tolerance to Tivicay /Descovy .  Reviewed previous lab work and discussed plan of care and U equals U.  No problems obtaining medication from the pharmacy  and covered by Medicaid.  Social determinants of health reviewed with no interventions indicated.  Continue current dose of Tivicay /Descovy .  Check lab work.  Plan for follow-up in 6 months or sooner if needed with lab work on the same day.  Relevant Orders   Comprehensive metabolic panel with GFR   HIV-1 RNA quant-no reflex-bld   T-helper cells (CD4) count (not at [facility])   Healthcare maintenance - Primary   Discussed importance of safe sexual practice and condom use. Condoms and site specific STD testing offered.  Vaccinations reviewed and declined following counseling. Cervical cancer screening up to date. Pregnancy test negative.  Dental care up to date.       Other Visit Diagnoses       Screening for STDs (sexually transmitted diseases)       Relevant Orders   RPR   C. trachomatis/N. gonorrhoeae RNA        I am having Danial Essex maintain her albuterol, ferrous sulfate , ondansetron , Descovy , and Tivicay .   Follow-up: Return in about 6 months (around 08/03/2024). or sooner if needed.    Cathlyn July, MSN, FNP-C Nurse Practitioner [facility] for Infectious Disease [facility] Main number: [phone]

## 2024-02-04 NOTE — Assessment & Plan Note (Signed)
 Discussed importance of safe sexual practice and condom use. Condoms and site specific STD testing offered.  Vaccinations reviewed and declined following counseling. Cervical cancer screening up to date. Pregnancy test negative.  Dental care up to date.

## 2024-02-04 NOTE — Patient Instructions (Addendum)
 Nice to see you. ? ?We will check your lab work today. ? ?Continue to take your medication daily as prescribed. ? ?Refills have been sent to the pharmacy. ? ?Plan for follow up in 6 months or sooner if needed with lab work on the same day. ? ?Have a great day and stay safe! ? ?

## 2024-02-07 LAB — COMPREHENSIVE METABOLIC PANEL WITH GFR
AG Ratio: 1.8 (calc) (ref 1.0–2.5)
ALT: 12 U/L (ref 6–29)
AST: 14 U/L (ref 10–30)
Albumin: 4.5 g/dL (ref 3.6–5.1)
Alkaline phosphatase (APISO): 54 U/L (ref 31–125)
BUN: 11 mg/dL (ref 7–25)
CO2: 29 mmol/L (ref 20–32)
Calcium: 9.5 mg/dL (ref 8.6–10.2)
Chloride: 102 mmol/L (ref 98–110)
Creat: 0.91 mg/dL (ref 0.50–0.97)
Globulin: 2.5 g/dL (ref 1.9–3.7)
Glucose, Bld: 95 mg/dL (ref 65–99)
Potassium: 4.1 mmol/L (ref 3.5–5.3)
Sodium: 136 mmol/L (ref 135–146)
Total Bilirubin: 0.2 mg/dL (ref 0.2–1.2)
Total Protein: 7 g/dL (ref 6.1–8.1)
eGFR: 84 mL/min/1.73m2 (ref >=60)

## 2024-02-07 LAB — RPR: RPR Ser Ql: NONREACTIVE

## 2024-02-07 LAB — C. TRACHOMATIS/N. GONORRHOEAE RNA
C. trachomatis RNA, TMA: NOT DETECTED
N. gonorrhoeae RNA, TMA: NOT DETECTED

## 2024-02-07 LAB — HIV-1 RNA QUANT-NO REFLEX-BLD
HIV 1 RNA Quant: NOT DETECTED {copies}/mL
HIV-1 RNA Quant, Log: NOT DETECTED {Log_copies}/mL

## 2024-02-07 LAB — T-HELPER CELLS (CD4) COUNT (NOT AT ARMC)
Absolute CD4: 979 {cells}/uL (ref 490–1740)
CD4 T Helper %: 46 % (ref 30–61)
Total lymphocyte count: 2145 {cells}/uL (ref 850–3900)

## 2024-02-08 ENCOUNTER — Ambulatory Visit: Payer: Self-pay | Admitting: Family

## 2024-02-17 ENCOUNTER — Other Ambulatory Visit: Payer: Self-pay

## 2024-02-21 NOTE — Progress Notes (Unsigned)
   Subjective:     Kristine Brown is a 33 y.o. female here at [facility] *** for a routine exam.  Current complaints: ***.  Personal and family health history reviewed: {yes/no:9010}.  Do you have a primary care provider? *** Do you feel safe at home? ***  Flowsheet Row Office Visit from 08/19/2023 in [facility] [place]  PHQ-2 Total Score 1    Health Maintenance Due  Topic Date Due   COVID-19 Vaccine (1) Never done   HPV VACCINES (2 - Risk 3-dose series) 10/21/2005   DTaP/Tdap/Td (1 - Tdap) Never done   Pneumococcal Vaccine (1 of 2 - PCV) Never done   Hepatitis B Vaccines 19-59 Average Risk (3 of 3 - 19+ 3-dose series) 12/26/2018     Risk factors for chronic health problems: Smoking: Alchohol/how much: Pt BMI: There is no height or weight on file to calculate BMI.   Gynecologic History Patient's last menstrual period was 01/19/2024 (approximate). Contraception: {method:5051} Last Pap: ***. Results were: {norm/abn:16337} Last mammogram: ***. Results were: {norm/abn:16337}  Obstetric History OB History  Gravida Para Term Preterm AB Living  10 3 2 1 7 3   SAB IAB Ectopic Multiple Live Births  6 1  0 3    # Outcome Date GA Lbr Len/2nd Weight Sex Type Anes PTL Lv  10 Preterm 08/04/22 [redacted]w[redacted]d  1 lb 11.5 oz (0.78 kg) M CS-Classical Gen  LIV  9 Term 08/10/15 [redacted]w[redacted]d  5 lb 12 oz (2.608 kg) F Vag-Spont  N LIV  8 Term 05/16/09 [redacted]w[redacted]d  5 lb 8.2 oz (2.5 kg) M Vag-Spont  N LIV  7 SAB           6 SAB           5 SAB           4 IAB           3 SAB           2 SAB           1 SAB              {Common ambulatory SmartLinks:19316}  Review of Systems {ros; complete:30496}    Objective:   There were no vitals filed for this visit. There is no height or weight on file to calculate BMI.  VS reviewed, nursing note reviewed,  Constitutional: well developed, well nourished, no distress HEENT: normocephalic, thyroid without enlargement  or mass HEART: RRR, no murmurs rubs/gallops RESP: clear and equal to auscultation bilaterally in all lobes  Breast Exam:  ***Deferred with low risks and shared decision making, discussed recommendation to start mammogram between 40-50 yo/ exam performed: right breast normal without mass, skin or nipple changes or axillary nodes, left breast normal without mass, skin or nipple changes or axillary nodes Abdomen: soft Neuro: alert and oriented x 3 Skin: warm, dry Psych: affect normal Pelvic exam: ***Deferred/ Performed: Cervix pink, visually closed, without lesion, scant white creamy discharge, vaginal walls and external genitalia normal Bimanual exam: Cervix 0/long/high, firm, anterior, neg CMT, uterus nontender, nonenlarged, adnexa without tenderness, enlargement, or mass        Assessment/Plan:   1. Well woman exam (Primary) ***  2. Asymptomatic HIV infection (HCC) ***  3. Encounter for counseling regarding contraception ***      Return in about 1 year (around 02/21/2025) for Annual.   Camie DELENA Rote, CNM 2:13 PM

## 2024-02-22 ENCOUNTER — Ambulatory Visit: Admitting: Certified Nurse Midwife

## 2024-02-22 ENCOUNTER — Other Ambulatory Visit (HOSPITAL_COMMUNITY): Payer: Self-pay

## 2024-02-22 DIAGNOSIS — Z21 Asymptomatic human immunodeficiency virus [HIV] infection status: Secondary | ICD-10-CM

## 2024-02-22 DIAGNOSIS — Z3009 Encounter for other general counseling and advice on contraception: Secondary | ICD-10-CM

## 2024-02-22 DIAGNOSIS — Z01419 Encounter for gynecological examination (general) (routine) without abnormal findings: Secondary | ICD-10-CM

## 2024-03-02 ENCOUNTER — Other Ambulatory Visit: Payer: Self-pay

## 2024-03-02 ENCOUNTER — Other Ambulatory Visit (HOSPITAL_COMMUNITY): Payer: Self-pay

## 2024-03-02 NOTE — Progress Notes (Signed)
 Specialty Pharmacy Ongoing Clinical Assessment Note  Kristine Brown is a 33 y.o. female who is being followed by the specialty pharmacy service for RxSp HIV   Patient's specialty medication(s) reviewed today: Dolutegravir  Sodium (Tivicay ); Emtricitabine -Tenofovir  AF (Descovy )   Missed doses in the last 4 weeks: 0   Patient/Caregiver did not have any additional questions or concerns.   Therapeutic benefit summary: Patient is achieving benefit   Adverse events/side effects summary: No adverse events/side effects   Patient's therapy is appropriate to: Continue    Goals Addressed             This Visit's Progress    Achieve Undetectable HIV Viral Load < 20   On track    Patient is on track. Patient will maintain adherence.  Patient's viral load remains undetectable as of 02/04/24.         Follow up: 12 months  Silvano LOISE Dolly Specialty Pharmacist

## 2024-03-02 NOTE — Progress Notes (Signed)
 Specialty Pharmacy Refill Coordination Note  Kristine Brown is a 33 y.o. female contacted today regarding refills of specialty medication(s) Dolutegravir  Sodium (Tivicay ); Emtricitabine -Tenofovir  AF (Descovy )   Patient requested Delivery   Delivery date: 03/03/24   Verified address: [address] [place] [address]   Medication will be filled on 03/02/24.

## 2024-03-24 ENCOUNTER — Other Ambulatory Visit (HOSPITAL_COMMUNITY): Payer: Self-pay

## 2024-03-28 ENCOUNTER — Other Ambulatory Visit: Payer: Self-pay

## 2024-03-28 ENCOUNTER — Other Ambulatory Visit (HOSPITAL_COMMUNITY): Payer: Self-pay

## 2024-03-28 NOTE — Progress Notes (Signed)
 Specialty Pharmacy Refill Coordination Note  Kristine Brown is a 33 y.o. female contacted today regarding refills of specialty medication(s) Dolutegravir  Sodium (Tivicay ); Emtricitabine -Tenofovir  AF (Descovy )   Patient requested Delivery   Delivery date: 03/31/24   Verified address: [address] [place] [address]   Medication will be filled on: 03/30/24

## 2024-03-29 ENCOUNTER — Other Ambulatory Visit: Payer: Self-pay

## 2024-04-24 ENCOUNTER — Other Ambulatory Visit: Payer: Self-pay

## 2024-04-24 ENCOUNTER — Other Ambulatory Visit (HOSPITAL_COMMUNITY): Payer: Self-pay

## 2024-04-24 NOTE — Progress Notes (Signed)
 Benefits Investigation Started  Reason: Patient Not Eligible Due To Non Payment of Premium. Patient to Contact Plan.  Routed to: Donna/Courtney

## 2024-04-28 ENCOUNTER — Other Ambulatory Visit (HOSPITAL_COMMUNITY): Payer: Self-pay

## 2024-05-02 ENCOUNTER — Ambulatory Visit (HOSPITAL_COMMUNITY)
Admission: EM | Admit: 2024-05-02 | Discharge: 2024-05-02 | Disposition: A | Discharge status: Home / Self Care | Payer: Self-pay | Attending: Family Medicine | Admitting: Family Medicine

## 2024-05-02 ENCOUNTER — Encounter (HOSPITAL_COMMUNITY): Payer: Self-pay | Admitting: Emergency Medicine

## 2024-05-02 DIAGNOSIS — Z202 Contact with and (suspected) exposure to infections with a predominantly sexual mode of transmission: Secondary | ICD-10-CM | POA: Insufficient documentation

## 2024-05-02 NOTE — Discharge Instructions (Signed)
 Staff will notify you if there is anything positive on the swab or on the blood work. It can take 2-3 days for the tests to result, depending on the day of the week your test was taken. You will only be notified if there are any positives on the testing; test results will also go to your MyChart if you are signed up for MyChart.

## 2024-05-02 NOTE — ED Triage Notes (Signed)
 Pt reports this past weekend was under the influence and had unprotected sexual intercourse and would like testing for STDs. Denies any s/s.

## 2024-05-02 NOTE — ED Notes (Signed)
 This RN stuck patient twice for blood work, both unsuccessful. Pt reports that she will go to her doctor for testing.  Will make Dr Vonna aware.

## 2024-05-02 NOTE — ED Provider Notes (Signed)
 [facility]    CSN: [id number] Arrival date & time: 05/02/24  1207      History   Chief Complaint No chief complaint on file.   HPI Kristine Brown is a 33 y.o. female.   HPI Here for STD testing.  She does not have any vaginal discharge or itching or pelvic pain.  She states she had unprotected intercourse recently and would like to check  She does take medication for HIV infection.  NKDA  Last menstrual cycle November 18  Past Medical History:  Diagnosis Date   Anxiety    Asthma    Depression    HIV (human immunodeficiency virus infection) [facility])     Patient Active Problem List   Diagnosis Date Noted   Gastroenteritis 08/19/2023   Irregular menses 06/28/2023   MRSA cellulitis 08/16/2022   Infected incision 08/10/2022   Sepsis after cesarean section 08/10/2022   Placental abruption, delivered 08/04/2022   Delivery by classical cesarean section 08/04/2022   Alpha thalassemia silent carrier 06/02/2022   Supervision of high risk pregnancy, antepartum 04/29/2022   Healthcare maintenance 07/22/2021   Depression 11/17/2019   Anxiety 05/12/2018   Asymptomatic HIV infection (HCC) 05/12/2018    Past Surgical History:  Procedure Laterality Date   CESAREAN SECTION N/A 08/04/2022   Procedure: CESAREAN SECTION;  Surgeon: Barbra Lang PARAS, DO;  Location: [facility] LD [facility]S;  Service: Obstetrics;  Laterality: N/A;   NO PAST SURGERIES      OB History     Gravida  10   Para  3   Term  2   Preterm  1   AB  7   Living  3      SAB  6   IAB  1   Ectopic      Multiple  0   Live Births  3            Home Medications    Prior to Admission medications   Medication Sig Start Date End Date Taking? Authorizing Provider  albuterol (VENTOLIN HFA) 108 (90 Base) MCG/ACT inhaler Inhale into the lungs. 08/20/20   [provider]  dolutegravir  (TIVICAY ) 50 MG tablet Take 1 tablet (50 mg total) by mouth daily. 01/10/24   Calone, Gregory D, FNP   emtricitabine -tenofovir  AF (DESCOVY ) 200-25 MG tablet Take 1 tablet by mouth daily. 01/10/24   Calone, Gregory D, FNP  ferrous sulfate  325 (65 FE) MG tablet Take 1 tablet (325 mg total) by mouth every other day. 08/07/22   Zina Jerilynn LABOR, MD    Family History Family History  Problem Relation Age of Onset   Healthy Mother    Cancer Father    COPD Father    Healthy Daughter    Healthy Son    Asthma Neg Hx    Diabetes Neg Hx    Hearing loss Neg Hx    Hyperlipidemia Neg Hx    Stroke Neg Hx     Social History Social History   Tobacco Use   Smoking status: Former    Types: Cigarettes   Smokeless tobacco: Never  Vaping Use   Vaping status: Never Used  Substance Use Topics   Alcohol use: Yes    Comment: socially   Drug use: Yes    Types: Marijuana     Allergies   Patient has no known allergies.   Review of Systems Review of Systems   Physical Exam Triage Vital Signs ED Triage Vitals  Encounter Vitals Group  BP 05/02/24 1424 121/72     Girls Systolic BP Percentile --      Girls Diastolic BP Percentile --      Boys Systolic BP Percentile --      Boys Diastolic BP Percentile --      Pulse Rate 05/02/24 1424 82     Resp 05/02/24 1424 17     Temp 05/02/24 1424 99.4 F (37.4 C)     Temp Source 05/02/24 1424 Oral     SpO2 05/02/24 1424 96 %     Weight --      Height --      Head Circumference --      Peak Flow --      Pain Score 05/02/24 1422 0     Pain Loc --      Pain Education --      Exclude from Growth Chart --    No data found.  Updated Vital Signs BP 121/72 (BP Location: Right Arm)   Pulse 82   Temp 99.4 F (37.4 C) (Oral)   Resp 17   LMP 04/18/2024 (Exact Date)   SpO2 96%   Visual Acuity Right Eye Distance:   Left Eye Distance:   Bilateral Distance:    Right Eye Near:   Left Eye Near:    Bilateral Near:     Physical Exam Vitals reviewed.  Constitutional:      General: She is not in acute distress.    Appearance: She is not  toxic-appearing.  Skin:    Coloration: Skin is not pale.  Neurological:     Mental Status: She is alert and oriented to person, place, and time.  Psychiatric:        Behavior: Behavior normal.      UC Treatments / Results  Labs (all labs ordered are listed, but only abnormal results are displayed) Labs Reviewed  CERVICOVAGINAL ANCILLARY ONLY    EKG   Radiology No results found.  Procedures Procedures (including critical care time)  Medications Ordered in UC Medications - No data to display  Initial Impression / Assessment and Plan / UC Course  I have reviewed the triage vital signs and the nursing notes.  Pertinent labs & imaging results that were available during my care of the patient were reviewed by me and considered in my medical decision making (see chart for details).     Blood was to be drawn for RPR, but staff was unsuccessful in drawing the blood so she decided to go to her infectious disease provider for further testing  Vaginal self swab is done, and we will notify of any positives on that and treat per protocol.  Final Clinical Impressions(s) / UC Diagnoses   Final diagnoses:  Potential exposure to STD     Discharge Instructions      Staff will notify you if there is anything positive on the swab or on the blood work. It can take 2-3 days for the tests to result, depending on the day of the week your test was taken. You will only be notified if there are any positives on the testing; test results will also go to your MyChart if you are signed up for MyChart.      ED Prescriptions   None    PDMP not reviewed this encounter.   Vonna Sharlet POUR, MD 05/02/24 2001

## 2024-05-03 ENCOUNTER — Other Ambulatory Visit (HOSPITAL_COMMUNITY): Payer: Self-pay

## 2024-05-03 LAB — CERVICOVAGINAL ANCILLARY ONLY
Chlamydia: NEGATIVE
Comment: NEGATIVE
Comment: NEGATIVE
Comment: NORMAL
Neisseria Gonorrhea: NEGATIVE
Trichomonas: NEGATIVE

## 2024-05-26 ENCOUNTER — Other Ambulatory Visit: Payer: Self-pay

## 2024-05-26 ENCOUNTER — Other Ambulatory Visit (HOSPITAL_COMMUNITY): Payer: Self-pay

## 2024-05-26 NOTE — Progress Notes (Signed)
 Specialty Pharmacy Refill Coordination Note  Kristine Brown is a 33 y.o. female contacted today regarding refills of specialty medication(s) Dolutegravir  Sodium (Tivicay ); Emtricitabine -Tenofovir  AF (Descovy )   Patient requested Pickup at [facility] date: 05/26/24   Medication will be filled on: 05/26/24

## 2024-05-29 ENCOUNTER — Other Ambulatory Visit (HOSPITAL_COMMUNITY): Payer: Self-pay

## 2024-06-16 ENCOUNTER — Other Ambulatory Visit (HOSPITAL_COMMUNITY): Payer: Self-pay

## 2024-06-19 ENCOUNTER — Other Ambulatory Visit (HOSPITAL_COMMUNITY): Payer: Self-pay

## 2024-06-21 ENCOUNTER — Other Ambulatory Visit: Payer: Self-pay

## 2024-06-23 ENCOUNTER — Other Ambulatory Visit (HOSPITAL_COMMUNITY): Payer: Self-pay

## 2024-06-23 ENCOUNTER — Other Ambulatory Visit: Payer: Self-pay | Admitting: Pharmacy Technician

## 2024-06-23 ENCOUNTER — Other Ambulatory Visit: Payer: Self-pay

## 2024-06-23 NOTE — Progress Notes (Signed)
 Specialty Pharmacy Refill Coordination Note  Kristine Brown is a 34 y.o. female contacted today regarding refills of specialty medication(s) Emtricitabine -Tenofovir  AF (Descovy ); Dolutegravir  Sodium (Tivicay )   Patient requested Delivery   Delivery date: 06/29/24   Verified address: [address] [place] [address]   Medication will be filled on: 06/28/24

## 2024-06-28 ENCOUNTER — Other Ambulatory Visit: Payer: Self-pay
